# Patient Record
Sex: Female | Born: 1955 | Race: Black or African American | Hispanic: No | Smoking: Former smoker
Health system: Southern US, Community
[De-identification: ages and names within clinical notes are randomized; demographics above are authoritative.]

## PROBLEM LIST (undated history)

## (undated) DIAGNOSIS — Z8489 Family history of other specified conditions: Secondary | ICD-10-CM

## (undated) DIAGNOSIS — C73 Malignant neoplasm of thyroid gland: Secondary | ICD-10-CM

## (undated) DIAGNOSIS — K219 Gastro-esophageal reflux disease without esophagitis: Secondary | ICD-10-CM

## (undated) DIAGNOSIS — M1990 Unspecified osteoarthritis, unspecified site: Secondary | ICD-10-CM

## (undated) DIAGNOSIS — E669 Obesity, unspecified: Secondary | ICD-10-CM

## (undated) DIAGNOSIS — B369 Superficial mycosis, unspecified: Secondary | ICD-10-CM

## (undated) DIAGNOSIS — E214 Other specified disorders of parathyroid gland: Secondary | ICD-10-CM

## (undated) DIAGNOSIS — I10 Essential (primary) hypertension: Secondary | ICD-10-CM

## (undated) DIAGNOSIS — E079 Disorder of thyroid, unspecified: Secondary | ICD-10-CM

## (undated) HISTORY — DX: Malignant neoplasm of thyroid gland: C73

## (undated) HISTORY — PX: IRRIGATION AND DEBRIDEMENT SEBACEOUS CYST: SHX5255

---

## 1995-12-01 HISTORY — PX: TUBAL LIGATION: SHX77

## 2005-04-22 ENCOUNTER — Encounter: Admission: RE | Admit: 2005-04-22 | Discharge: 2005-04-22 | Payer: Self-pay | Admitting: Internal Medicine

## 2006-05-13 ENCOUNTER — Encounter: Admission: RE | Admit: 2006-05-13 | Discharge: 2006-05-13 | Payer: Self-pay | Admitting: Internal Medicine

## 2010-12-21 ENCOUNTER — Encounter: Payer: Self-pay | Admitting: Internal Medicine

## 2011-03-13 ENCOUNTER — Other Ambulatory Visit: Payer: Self-pay | Admitting: Internal Medicine

## 2011-03-13 DIAGNOSIS — Z1231 Encounter for screening mammogram for malignant neoplasm of breast: Secondary | ICD-10-CM

## 2011-03-20 ENCOUNTER — Ambulatory Visit: Payer: Self-pay

## 2011-04-03 ENCOUNTER — Ambulatory Visit
Admission: RE | Admit: 2011-04-03 | Discharge: 2011-04-03 | Disposition: A | Discharge status: Home / Self Care | Payer: BC Managed Care – PPO | Source: Ambulatory Visit | Attending: Internal Medicine | Admitting: Internal Medicine

## 2011-04-03 DIAGNOSIS — Z1231 Encounter for screening mammogram for malignant neoplasm of breast: Secondary | ICD-10-CM

## 2011-07-08 ENCOUNTER — Inpatient Hospital Stay (INDEPENDENT_AMBULATORY_CARE_PROVIDER_SITE_OTHER)
Admission: RE | Admit: 2011-07-08 | Discharge: 2011-07-08 | Disposition: A | Discharge status: Home / Self Care | Payer: BC Managed Care – PPO | Source: Ambulatory Visit | Attending: Family Medicine | Admitting: Family Medicine

## 2011-07-08 ENCOUNTER — Ambulatory Visit (INDEPENDENT_AMBULATORY_CARE_PROVIDER_SITE_OTHER): Payer: BC Managed Care – PPO

## 2011-07-08 DIAGNOSIS — S92919A Unspecified fracture of unspecified toe(s), initial encounter for closed fracture: Secondary | ICD-10-CM

## 2013-10-03 ENCOUNTER — Other Ambulatory Visit: Payer: Self-pay | Admitting: Internal Medicine

## 2013-10-03 DIAGNOSIS — Z1231 Encounter for screening mammogram for malignant neoplasm of breast: Secondary | ICD-10-CM

## 2013-10-23 ENCOUNTER — Ambulatory Visit
Admission: RE | Admit: 2013-10-23 | Discharge: 2013-10-23 | Disposition: A | Discharge status: Home / Self Care | Payer: BC Managed Care – PPO | Source: Ambulatory Visit | Attending: Internal Medicine | Admitting: Internal Medicine

## 2013-10-23 DIAGNOSIS — Z1231 Encounter for screening mammogram for malignant neoplasm of breast: Secondary | ICD-10-CM

## 2014-03-24 ENCOUNTER — Emergency Department (INDEPENDENT_AMBULATORY_CARE_PROVIDER_SITE_OTHER)
Admission: EM | Admit: 2014-03-24 | Discharge: 2014-03-24 | Disposition: A | Discharge status: Home / Self Care | Payer: BC Managed Care – PPO | Source: Home / Self Care | Attending: Family Medicine | Admitting: Family Medicine

## 2014-03-24 ENCOUNTER — Encounter (HOSPITAL_COMMUNITY): Payer: Self-pay | Admitting: Emergency Medicine

## 2014-03-24 DIAGNOSIS — H01006 Unspecified blepharitis left eye, unspecified eyelid: Secondary | ICD-10-CM

## 2014-03-24 DIAGNOSIS — H00036 Abscess of eyelid left eye, unspecified eyelid: Secondary | ICD-10-CM

## 2014-03-24 DIAGNOSIS — H01009 Unspecified blepharitis unspecified eye, unspecified eyelid: Secondary | ICD-10-CM

## 2014-03-24 DIAGNOSIS — H00039 Abscess of eyelid unspecified eye, unspecified eyelid: Secondary | ICD-10-CM

## 2014-03-24 MED ORDER — AZITHROMYCIN 250 MG PO TABS: 250.0000 mg | ORAL_TABLET | Freq: Every day | ORAL | Status: DC

## 2014-03-24 NOTE — Discharge Instructions (Signed)
Blepharitis Blepharitis is redness, soreness, and swelling (inflammation) of one or both eyelids. It may be caused by an allergic reaction or a bacterial infection. Blepharitis may also be associated with reddened, scaly skin (seborrhea) of the scalp and eyebrows. While you sleep, eye discharge may cause your eyelashes to stick together. Your eyelids may itch, burn, swell, and may lose their lashes. These will grow back. Your eyes may become sensitive. Blepharitis may recur and need repeated treatment. If this is the case, you may require further evaluation by an eye specialist (ophthalmologist). HOME CARE INSTRUCTIONS   Keep your hands clean.  Use a clean towel each time you dry your eyelids. Do not use this towel to clean other areas. Do not share a towel or makeup with anyone.  Wash your eyelids with warm water or warm water mixed with a small amount of baby shampoo. Do this twice a day or as often as needed.  Wash your face and eyebrows at least once a day.  Use warm compresses 2 times a day for 10 minutes at a time, or as directed by your caregiver.  Apply antibiotic ointment as directed by your caregiver.  Avoid rubbing your eyes.  Avoid wearing makeup until you get better.  Follow up with your caregiver as directed. SEEK IMMEDIATE MEDICAL CARE IF:   You have pain, redness, or swelling that gets worse or spreads to other parts of your face.  Your vision changes, or you have pain when looking at lights or moving objects.  You have a fever.  Your symptoms continue for longer than 2 to 4 days or become worse. MAKE SURE YOU:   Understand these instructions.  Will watch your condition.  Will get help right away if you are not doing well or get worse. Document Released: 11/13/2000 Document Revised: 02/08/2012 Document Reviewed: 12/24/2010 [facility] Patient Information 2014 [place], [place].  Periorbital Cellulitis Periorbital cellulitis is a common infection that can affect the  eyelid and the soft tissues that surround the eyeball. The infection may also affect the structures that produce and drain tears. It does not affect the eyeball itself. Natural tissue barriers usually prevent the spread of this infection to the eyeball and other deeper areas of the eye socket.  CAUSES  Bacterial infection.  Long-term (chronic) sinus infections.  An object (foreign body) stuck behind the eye.  An injury that goes through the eyelid tissues.  An injury that causes an infection, such as an insect sting.  Fracture of the bone around the eye.  Infections which have spread from the eyelid or other structures around the eye.  Bite wounds.  Inflammation or infection of the lining membranes of the brain (meningitis).  An infection in the blood (septicemia).  Dental infection (abscess).  Viral infection (this is rare). SYMPTOMS Symptoms usually come on suddenly.  Pain in the eye.  Red, hot, and swollen eyelids and possibly cheeks. The swelling is sometimes bad enough that the eyelids cannot open. Some infections make the eyelids look purple.  Fever and feeling generally ill.  Pain when touching the area around the eye. DIAGNOSIS  Periorbital cellulitis can be diagnosed from an eye exam. In severe cases, your caregiver might suggest:  Blood tests.  Imaging tests (such as a CT scan) to examine the sinuses and the area around and behind the eyeball. TREATMENT If your caregiver feels that you do not have any signs of serious infection, treatment may include:  Antibiotics.  Nasal decongestants to reduce swelling.  Referral to a dentist if it is suspected that the infection was caused by a prior tooth infection.  Examination every day to make sure the problem is improving. HOME CARE INSTRUCTIONS  Take your antibiotics as directed. Finish them even if you start to feel better.  Some pain is normal with this condition. Take pain medicine as directed by your  caregiver. Only take pain medicines approved by your caregiver.  It is important to drink fluids. Drink enough water and fluids to keep your urine clear or pale yellow.  Do not smoke.  Rest and get plenty of sleep.  Mild or moderate fevers generally have no long-term effects and often do not require treatment.  If your caregiver has given you a follow-up appointment, it is very important to keep that appointment. Your caregiver will need to make sure that the infection is getting better. It is important to check that a more serious infection is not developing. SEEK IMMEDIATE MEDICAL CARE IF:  Your eyelids become more painful, red, warm, or swollen.  You develop double vision or your vision becomes blurred or worsens in any way.  You have trouble moving your eyes.  The eye looks like it is popping out (proptosis).  You develop a severe headache, severe neck pain, or neck stiffness.  You develop repeated vomiting.  You have a fever or persistent symptoms for more than 72 hours.  You have a fever and your symptoms suddenly get worse. MAKE SURE YOU:  Understand these instructions.  Will watch your condition.  Will get help right away if you are not doing well or get worse. Document Released: 12/19/2010 Document Revised: 02/08/2012 Document Reviewed: 12/19/2010 [facility] Patient Information 2014 [place], [place].   F/U with Opthalmology if worsens.

## 2014-03-24 NOTE — ED Notes (Signed)
C/o left belepharitis  States she seen PCP and was dx with belepharitis  Drops was prescibed States eye hurts now States she has a numbness feeling in her nose to her left cheek bone States area around eye is redder

## 2014-03-24 NOTE — ED Provider Notes (Signed)
CSN: [id number]     Arrival date & time 03/24/14  1208 History   First MD Initiated Contact with Patient 03/24/14 1339     Chief Complaint  Patient presents with  . Belepharitis   (Consider location/radiation/quality/duration/timing/severity/associated sxs/prior Treatment) HPI Comments: Patient presents with left eyelid swelling. Initially diagnosed with Blepharitis by her PCP 4 days ago. She was placed on Cipro drops, but reports that now the eyelid is worse with pain and swelling into the cheek area with slightly increase pain. Her eye feels "gritty" and "dry". No eye redness. No change in vision. No fever or chills.   The history is provided by the patient.    History reviewed. No pertinent past medical history. History reviewed. No pertinent past surgical history. History reviewed. No pertinent family history. History  Substance Use Topics  . Smoking status: Not on file  . Smokeless tobacco: Not on file  . Alcohol Use: Not on file   OB History   Grav Para Term Preterm Abortions TAB SAB Ect Mult Living                 Review of Systems  All other systems reviewed and are negative.   Allergies  Review of patient's allergies indicates no known allergies.  Home Medications   Prior to Admission medications   Medication Sig Start Date End Date Taking? Authorizing Provider  amLODipine (NORVASC) 5 MG tablet Take 5 mg by mouth daily.   Yes Historical Provider, MD  triamterene-hydrochlorothiazide (MAXZIDE-25) 37.5-25 MG per tablet Take 1 tablet by mouth daily.   Yes Historical Provider, MD  azithromycin (ZITHROMAX) 250 MG tablet Take 1 tablet (250 mg total) by mouth daily. Take first 2 tablets together, then 1 every day until finished. 03/24/14   Bjorn Pippin, PA-C   BP 162/91  Pulse 76  Temp(Src) 98.5 F (36.9 C) (Oral)  Resp 18  SpO2 99% Physical Exam  Nursing note and vitals reviewed. Constitutional: She is oriented to person, place, and time. She appears  well-developed and well-nourished. No distress.  HENT:  Head: Normocephalic and atraumatic.  Mouth/Throat: Oropharynx is clear and moist.  Scalp with dryness and scaliness, left eyelid is slightly pink in color, with local swelling, mild changes of collarette are noted.   Eyes: Conjunctivae are normal. Pupils are equal, round, and reactive to light. Right eye exhibits no discharge. Left eye exhibits no discharge. No scleral icterus.  No scleral injection, fundus exam normal, pain to palpation along the inferior portion of left eye, no swelling, slight warmth  Neck: Normal range of motion. Neck supple.  Lymphadenopathy:    She has no cervical adenopathy.  Neurological: She is alert and oriented to person, place, and time. No cranial nerve deficit.  Skin: Skin is warm and dry. Rash noted. She is not diaphoretic.  Psychiatric: Her behavior is normal.    ED Course  Procedures (including critical care time) Labs Review Labs Reviewed - No data to display  Imaging Review No results found.   MDM   1. Blepharitis of left eye   2. Cellulitis of left eyelid    Education given re: warm compresses, lid massage and washing. In the setting of swelling below the eye, will cover with oral abx In case of severe blepharitis or for possible overlapping cellulitis. She is in agreement. Urgent f/u with Opthalmology if worsens.    Bjorn Pippin, PA-C 03/24/14 1424

## 2014-03-24 NOTE — ED Provider Notes (Signed)
Medical screening examination/treatment/procedure(s) were performed by resident physician or non-physician practitioner and as supervising physician I was immediately available for consultation/collaboration.   Pauline Good MD.   Billy Fischer, MD 03/24/14 [phone]

## 2014-11-30 DIAGNOSIS — C73 Malignant neoplasm of thyroid gland: Secondary | ICD-10-CM

## 2014-11-30 HISTORY — DX: Malignant neoplasm of thyroid gland: C73

## 2015-02-26 ENCOUNTER — Other Ambulatory Visit: Payer: Self-pay | Admitting: Nurse Practitioner

## 2015-03-14 ENCOUNTER — Encounter (HOSPITAL_COMMUNITY)
Admission: RE | Admit: 2015-03-14 | Discharge: 2015-03-14 | Disposition: A | Discharge status: Home / Self Care | Payer: BC Managed Care – PPO | Source: Ambulatory Visit | Attending: Nurse Practitioner | Admitting: Nurse Practitioner

## 2015-03-14 ENCOUNTER — Encounter (HOSPITAL_COMMUNITY): Payer: BC Managed Care – PPO

## 2015-03-14 MED ORDER — TECHNETIUM TC 99M SESTAMIBI - CARDIOLITE
26.0000 | Freq: Once | INTRAVENOUS | Status: AC | PRN
  Administered 2015-03-14: 26 via INTRAVENOUS

## 2015-04-09 ENCOUNTER — Other Ambulatory Visit: Payer: Self-pay | Admitting: Otolaryngology

## 2015-04-09 DIAGNOSIS — D497 Neoplasm of unspecified behavior of endocrine glands and other parts of nervous system: Secondary | ICD-10-CM

## 2015-04-12 ENCOUNTER — Ambulatory Visit
Admission: RE | Admit: 2015-04-12 | Discharge: 2015-04-12 | Disposition: A | Discharge status: Home / Self Care | Payer: BC Managed Care – PPO | Source: Ambulatory Visit | Attending: Otolaryngology | Admitting: Otolaryngology

## 2015-04-12 DIAGNOSIS — D497 Neoplasm of unspecified behavior of endocrine glands and other parts of nervous system: Secondary | ICD-10-CM

## 2015-04-17 ENCOUNTER — Other Ambulatory Visit: Payer: Self-pay | Admitting: Otolaryngology

## 2015-04-17 DIAGNOSIS — D497 Neoplasm of unspecified behavior of endocrine glands and other parts of nervous system: Secondary | ICD-10-CM

## 2015-05-01 ENCOUNTER — Ambulatory Visit
Admission: RE | Admit: 2015-05-01 | Discharge: 2015-05-01 | Disposition: A | Discharge status: Home / Self Care | Payer: BC Managed Care – PPO | Source: Ambulatory Visit | Attending: Otolaryngology | Admitting: Otolaryngology

## 2015-05-01 ENCOUNTER — Other Ambulatory Visit (HOSPITAL_COMMUNITY)
Admission: RE | Admit: 2015-05-01 | Discharge: 2015-05-01 | Disposition: A | Discharge status: Home / Self Care | Payer: BC Managed Care – PPO | Source: Ambulatory Visit | Attending: Interventional Radiology | Admitting: Interventional Radiology

## 2015-05-01 DIAGNOSIS — E041 Nontoxic single thyroid nodule: Secondary | ICD-10-CM | POA: Diagnosis not present

## 2015-05-01 DIAGNOSIS — D497 Neoplasm of unspecified behavior of endocrine glands and other parts of nervous system: Secondary | ICD-10-CM

## 2015-05-22 ENCOUNTER — Ambulatory Visit: Payer: Self-pay | Admitting: Otolaryngology

## 2015-05-22 NOTE — H&P (Signed)
Assessment  Hypercalcemia (275.42) (E83.52). 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 Orders  US Thyroid Ultrasound; Requested for: 04 Apr 2015. Discussed  Hypercalcemia with a right upper parathyroid adenoma identified on nuclear imaging. On exam, there is a palpable thyroid nodule also on the right. I want to get an ultrasound to evaluate this. This will be worked up independently and prior to parathyroid surgery in order to avoid 2 possible separate operations. She is likely going to require parathyroidectomy. More than likely it will involve just one abnormal gland although identification of the second gland on that same side will be performed. Reason For Visit  Michelle Campos is here today at the kind request of Odem, Donna for consultation and opinion. Thyroid tumor. HPI  Long history of hypercalcemia. She has been having some problems with memory and psychological issues recently. A recent sestamibi scan reveals a suspicious adenoma of the right upper parathyroid. Allergies  No Known Drug Allergies. Current Meds  Triamterene-HCTZ 37.5-25 MG Oral Capsule;; RPT Fish Oil CAPS;; RPT Vitamin D3 TABS;; RPT AmLODIPine Besylate 5 MG Oral Tablet;; RPT. Active Problems  Acid reflux   (530.81) (K21.9) Arthritis   (716.90) (M19.90) Hypertension   (401.9) (I10). PMH  History of lipoma (V13.89) (Z86.018). PSH  Oral Surgery Tooth Extraction Surgery Excision Lipoma Tubal Ligation (V25.2). Family Hx  Family history of cardiac disorder: Father (V17.49) (Z82.49) Family history of diabetes mellitus: Mother (V18.0) (Z83.3) Family history of essential hypertension: Mother,Father (V17.49) (Z82.49) Family history of multiple myeloma: Brother (V16.7) (Z80.7). Personal Hx  Daily caffeine consumption, 1 serving a day Former smoker (V15.82) (Z87.891). ROS  Systemic: Feeling tired (fatigue).  No fever, no night sweats, and no recent weight loss. Head: No  headache. Eyes: No eye symptoms. Otolaryngeal: No hearing loss, no earache, no tinnitus, and no purulent nasal discharge.  No nasal passage blockage (stuffiness), no snoring, no sneezing, no hoarseness, and no sore throat. Cardiovascular: No chest pain or discomfort  and no palpitations. Pulmonary: No dyspnea, no cough, and no wheezing. Gastrointestinal: No dysphagia  and no heartburn.  No nausea, no abdominal pain, and no melena.  No diarrhea. Genitourinary: No dysuria. Endocrine: No muscle weakness. Musculoskeletal: No calf muscle cramps, no arthralgias, and no soft tissue swelling. Neurological: No dizziness, no fainting, no tingling, and no numbness. Psychological: No anxiety  and no depression. Skin: No rash. 12 system ROS was obtained and reviewed on the Health Maintenance form dated today.  Positive responses are shown above.  If the symptom is not checked, the patient has denied it. Vital Signs   Recorded by Rogers,Lisa on 04 Apr 2015 03:41 PM BP:152/103,  Height: 5 ft 8 in, Weight: 232 lb , BMI: 35.3 kg/m2,  BMI Calculated: 35.28 ,  BSA Calculated: 2.18. Physical Exam  APPEARANCE: Well developed, heavyset lady, in no acute distress. She is very anxious and nervous.  Oriented to time, place and person. COMMUNICATION: Normal voice   HEAD & FACE:  No scars, lesions or masses of head and face.  Sinuses nontender to palpation.  Salivary glands without mass or tenderness.  Facial strength symmetric.  No facial lesion, scars, or mass. EYES: EOMI with normal primary gaze alignment. Visual acuity grossly intact.  PERRLA EXTERNAL EAR & NOSE: No scars, lesions or masses  EAC & TYMPANIC MEMBRANE:  EAC shows no obstructing lesions or debris and tympanic membranes are normal bilaterally with good movement to insufflation. GROSS HEARING: Normal  TMJ:  Nontender  INTRANASAL EXAM: No polyps   or purulence.  NASOPHARYNX: Normal, without lesions. LIPS, TEETH & GUMS: No lip lesions, normal dentition  and normal gums. ORAL CAVITY/OROPHARYNX:  Oral mucosa moist without lesion or asymmetry of the palate, tongue, tonsil or posterior pharynx. LARYNX (mirror exam):  No lesions of the epiglottis, false cord or TVC's and cords move well to phonation. HYPOPHARYNX (mirror exam): No lesions, asymmetry or pooling of secretions. NECK:  Supple without adenopathy or mass. THYROID: 1 cm firm nodule palpable in the right anterior thyroid.  NEUROLOGIC:  No gross CN deficits. No nystagmus noted.   LYMPHATIC:  No enlarged nodes palpable. Signature  Electronically signed by : Izora Gala  M.D.; 04/04/2015 4:10 PM EST.

## 2015-05-23 ENCOUNTER — Encounter (HOSPITAL_COMMUNITY)
Admission: RE | Admit: 2015-05-23 | Discharge: 2015-05-23 | Disposition: A | Discharge status: Home / Self Care | Payer: BC Managed Care – PPO | Source: Ambulatory Visit | Attending: Anesthesiology | Admitting: Anesthesiology

## 2015-05-23 ENCOUNTER — Encounter (HOSPITAL_COMMUNITY)
Admission: RE | Admit: 2015-05-23 | Discharge: 2015-05-23 | Disposition: A | Discharge status: Home / Self Care | Payer: BC Managed Care – PPO | Source: Ambulatory Visit | Attending: Otolaryngology | Admitting: Otolaryngology

## 2015-05-23 ENCOUNTER — Encounter (HOSPITAL_COMMUNITY): Payer: Self-pay

## 2015-05-23 DIAGNOSIS — Z01811 Encounter for preprocedural respiratory examination: Secondary | ICD-10-CM

## 2015-05-23 DIAGNOSIS — Z79899 Other long term (current) drug therapy: Secondary | ICD-10-CM | POA: Diagnosis not present

## 2015-05-23 DIAGNOSIS — Z87891 Personal history of nicotine dependence: Secondary | ICD-10-CM | POA: Diagnosis not present

## 2015-05-23 DIAGNOSIS — R9431 Abnormal electrocardiogram [ECG] [EKG]: Secondary | ICD-10-CM | POA: Diagnosis not present

## 2015-05-23 DIAGNOSIS — Z01812 Encounter for preprocedural laboratory examination: Secondary | ICD-10-CM | POA: Diagnosis not present

## 2015-05-23 DIAGNOSIS — E079 Disorder of thyroid, unspecified: Secondary | ICD-10-CM | POA: Insufficient documentation

## 2015-05-23 DIAGNOSIS — Z01818 Encounter for other preprocedural examination: Secondary | ICD-10-CM | POA: Diagnosis not present

## 2015-05-23 DIAGNOSIS — I10 Essential (primary) hypertension: Secondary | ICD-10-CM | POA: Insufficient documentation

## 2015-05-23 DIAGNOSIS — K219 Gastro-esophageal reflux disease without esophagitis: Secondary | ICD-10-CM | POA: Diagnosis not present

## 2015-05-23 HISTORY — DX: Obesity, unspecified: E66.9

## 2015-05-23 HISTORY — DX: Superficial mycosis, unspecified: B36.9

## 2015-05-23 HISTORY — DX: Gastro-esophageal reflux disease without esophagitis: K21.9

## 2015-05-23 HISTORY — DX: Essential (primary) hypertension: I10

## 2015-05-23 HISTORY — DX: Unspecified osteoarthritis, unspecified site: M19.90

## 2015-05-23 HISTORY — DX: Hypercalcemia: E83.52

## 2015-05-23 HISTORY — DX: Disorder of thyroid, unspecified: E07.9

## 2015-05-23 LAB — CBC
HEMATOCRIT: 38.5 % (ref 36.0–46.0)
Hemoglobin: 12.9 g/dL (ref 12.0–15.0)
MCH: 27.2 pg (ref 26.0–34.0)
MCHC: 33.5 g/dL (ref 30.0–36.0)
MCV: 81.2 fL (ref 78.0–100.0)
PLATELETS: 267 10*3/uL (ref 150–400)
RBC: 4.74 MIL/uL (ref 3.87–5.11)
RDW: 14.7 % (ref 11.5–15.5)
WBC: 5.3 10*3/uL (ref 4.0–10.5)

## 2015-05-23 LAB — BASIC METABOLIC PANEL
Anion gap: 3 — ABNORMAL LOW (ref 5–15)
BUN: 15 mg/dL (ref 6–20)
CALCIUM: 12 mg/dL — AB (ref 8.9–10.3)
CO2: 28 mmol/L (ref 22–32)
Chloride: 106 mmol/L (ref 101–111)
Creatinine, Ser: 0.77 mg/dL (ref 0.44–1.00)
GFR calc Af Amer: >60 mL/min (ref >60)
Glucose, Bld: 95 mg/dL (ref 65–99)
Potassium: 3.5 mmol/L (ref 3.5–5.1)
SODIUM: 137 mmol/L (ref 135–145)

## 2015-05-23 NOTE — Progress Notes (Signed)
STOP-Bang score 4; Results sent to [facility]

## 2015-05-23 NOTE — Progress Notes (Signed)
   05/23/15 1255  OBSTRUCTIVE SLEEP APNEA  Have you ever been diagnosed with sleep apnea through a sleep study? No  Do you snore loudly (loud enough to be heard through closed doors)?  1  Do you often feel tired, fatigued, or sleepy during the daytime? 0  Has anyone observed you stop breathing during your sleep? 0  Do you have, or are you being treated for high blood pressure? 1  BMI more than 35 kg/m2? 1  Age over 59 years old? 1  Neck circumference greater than 40 cm/16 inches? 0  Gender: 0

## 2015-05-23 NOTE — Pre-Procedure Instructions (Signed)
Michelle Campos  05/23/2015       Your procedure is scheduled on Monday, June 27.  Report to [facility] at 0800 A.M.  Call this number if you have problems the morning of surgery:  [phone]   Remember:  Do not eat food or drink liquids after midnight.Sunday night   Take these medicines the morning of surgery with A SIP OF WATER: Amlodipine (Norvasc)    Do not wear jewelry, make-up or nail polish.  Do not wear lotions, powders, or perfumes.  Do notwear deodorant.  Do not shave 48 hours prior to surgery.     Do not bring valuables to the hospital.  [facility] is not responsible for any belongings or valuables.  Contacts, dentures or bridgework may not be worn into surgery.  Leave your suitcase in the car.  After surgery it may be brought to your room.  For patients admitted to the hospital, discharge time will be determined by your treatment team.      Special instructions:  [facility] - Preparing for Surgery  Before surgery, you can play an important role.  Because skin is not sterile, your skin needs to be as free of germs as possible.  You can reduce the number of germs on you skin by washing with CHG (chlorahexidine gluconate) soap before surgery.  CHG is an antiseptic cleaner which kills germs and bonds with the skin to continue killing germs even after washing.  Please DO NOT use if you have an allergy to CHG or antibacterial soaps.  If your skin becomes reddened/irritated stop using the CHG and inform your nurse when you arrive at Short Stay.  Do not shave (including legs and underarms) for at least 48 hours prior to the first CHG shower.  You may shave your face.  Please follow these instructions carefully:   1.  Shower with CHG Soap the night before surgery and the     morning of Surgery.  2.  If you choose to wash your hair, wash your hair first as usual with your   normal shampoo.  3.  After you shampoo, rinse your hair and body  thoroughly to remove the    Shampoo.  4.  Use CHG as you would any other liquid soap.  You can apply chg directly   to the skin and wash gently with scrungie or a clean washcloth.  5.  Apply the CHG Soap to your body ONLY FROM THE NECK DOWN.   Do not use on open wounds or open sores.  Avoid contact with your eyes,   ears, mouth and genitals (private parts).  Wash genitals (private parts)   with your normal soap.  6.  Wash thoroughly, paying special attention to the area where your surgery  will be performed.  7.  Thoroughly rinse your body with warm water from the neck down.  8.  DO NOT shower/wash with your normal soap after using and rinsing off    the CHG Soap.  9.  Pat yourself dry with a clean towel.            10.  Wear clean pajamas.            11 .  Place clean sheets on your bed the night of your first shower and do not   sleep with pets.  Day of Surgery  Do not apply any lotions/deoderants the morning of surgery.  Please wear clean clothes to the hospital/surgery  center.    Please read over the following fact sheets that you were given. Pain Booklet, Coughing and Deep Breathing and Surgical Site Infection Prevention

## 2015-05-24 NOTE — Progress Notes (Signed)
Anesthesia Chart Review:  Pt is 59 year old female scheduled for R thyroid lobectomy, R parathyroidecotmy with frozen section, possible total thyroidectomy on 05/27/2015 with Dr. Constance Holster.   PMH includes: HTN, GERD, thyroid mass. Former smoker. BMI 35.5.   Medications include: amlodipine, maxzide.   Preoperative labs reviewed.  Ca 12.   Chest x-ray 05/23/2015 reviewed. No active cardiopulmonary disease.   EKG 05/23/2015: NSR. Cannot rule out Anterior infarct, age undetermined.  Reviewed EKG with Dr. Lissa Hoard.   If no changes, I anticipate pt can proceed with surgery as scheduled.   Willeen Cass, FNP-BC [facility]/Anesthesiology Phone: [phone] 05/24/2015 3:04 PM

## 2015-05-27 ENCOUNTER — Ambulatory Visit (HOSPITAL_COMMUNITY): Payer: BC Managed Care – PPO | Admitting: Certified Registered"

## 2015-05-27 ENCOUNTER — Observation Stay (HOSPITAL_COMMUNITY)
Admission: RE | Admit: 2015-05-27 | Discharge: 2015-05-28 | Disposition: A | Discharge status: Home / Self Care | Payer: BC Managed Care – PPO | Source: Ambulatory Visit | Attending: Otolaryngology | Admitting: Otolaryngology

## 2015-05-27 ENCOUNTER — Encounter (HOSPITAL_COMMUNITY)
Admission: RE | Disposition: A | Discharge status: Home / Self Care | Payer: Self-pay | Source: Ambulatory Visit | Attending: Otolaryngology

## 2015-05-27 ENCOUNTER — Encounter (HOSPITAL_COMMUNITY): Payer: Self-pay | Admitting: *Deleted

## 2015-05-27 ENCOUNTER — Ambulatory Visit (HOSPITAL_COMMUNITY): Payer: BC Managed Care – PPO | Admitting: Emergency Medicine

## 2015-05-27 DIAGNOSIS — Z87891 Personal history of nicotine dependence: Secondary | ICD-10-CM | POA: Diagnosis not present

## 2015-05-27 DIAGNOSIS — M1990 Unspecified osteoarthritis, unspecified site: Secondary | ICD-10-CM | POA: Diagnosis not present

## 2015-05-27 DIAGNOSIS — I10 Essential (primary) hypertension: Secondary | ICD-10-CM | POA: Diagnosis not present

## 2015-05-27 DIAGNOSIS — D351 Benign neoplasm of parathyroid gland: Secondary | ICD-10-CM | POA: Diagnosis present

## 2015-05-27 DIAGNOSIS — C73 Malignant neoplasm of thyroid gland: Principal | ICD-10-CM | POA: Insufficient documentation

## 2015-05-27 HISTORY — PX: THYROID LOBECTOMY: SHX420

## 2015-05-27 HISTORY — PX: THYROIDECTOMY: SHX17

## 2015-05-27 HISTORY — PX: PARATHYROIDECTOMY: SHX19

## 2015-05-27 HISTORY — DX: Other specified disorders of parathyroid gland: E21.4

## 2015-05-27 HISTORY — DX: Family history of other specified conditions: Z84.89

## 2015-05-27 SURGERY — THYROIDECTOMY
Anesthesia: General | Site: Neck | Laterality: Right

## 2015-05-27 MED ORDER — OXYCODONE HCL 5 MG PO TABS: 5.0000 mg | ORAL_TABLET | Freq: Once | ORAL | Status: DC | PRN

## 2015-05-27 MED ORDER — HYDROCODONE-ACETAMINOPHEN 5-325 MG PO TABS: 1.0000 | ORAL_TABLET | ORAL | Status: DC | PRN

## 2015-05-27 MED ORDER — FENTANYL CITRATE (PF) 250 MCG/5ML IJ SOLN
INTRAMUSCULAR | Status: AC
  Filled 2015-05-27: qty 5

## 2015-05-27 MED ORDER — LACTATED RINGERS IV SOLN
INTRAVENOUS | Status: DC
  Administered 2015-05-27: 09:00:00 via INTRAVENOUS

## 2015-05-27 MED ORDER — LIDOCAINE HCL (CARDIAC) 20 MG/ML IV SOLN
INTRAVENOUS | Status: DC | PRN
  Administered 2015-05-27: 40 mg via INTRAVENOUS

## 2015-05-27 MED ORDER — NEOSTIGMINE METHYLSULFATE 10 MG/10ML IV SOLN
INTRAVENOUS | Status: DC | PRN
  Administered 2015-05-27: 4 mg via INTRAVENOUS

## 2015-05-27 MED ORDER — OMEGA-3-ACID ETHYL ESTERS 1 G PO CAPS
1.0000 g | ORAL_CAPSULE | Freq: Every day | ORAL | Status: DC
  Administered 2015-05-27 – 2015-05-28 (×2): 1 g via ORAL
  Filled 2015-05-27 (×2): qty 1

## 2015-05-27 MED ORDER — PROMETHAZINE HCL 25 MG RE SUPP: 25.0000 mg | Freq: Four times a day (QID) | RECTAL | Status: DC | PRN

## 2015-05-27 MED ORDER — CEFAZOLIN SODIUM-DEXTROSE 2-3 GM-% IV SOLR
2.0000 g | INTRAVENOUS | Status: AC
  Administered 2015-05-27: 2 g via INTRAVENOUS
  Filled 2015-05-27: qty 50

## 2015-05-27 MED ORDER — MIDAZOLAM HCL 5 MG/5ML IJ SOLN
INTRAMUSCULAR | Status: DC | PRN
  Administered 2015-05-27: 2 mg via INTRAVENOUS

## 2015-05-27 MED ORDER — ARTIFICIAL TEARS OP OINT
TOPICAL_OINTMENT | OPHTHALMIC | Status: DC | PRN
  Administered 2015-05-27: 1 via OPHTHALMIC

## 2015-05-27 MED ORDER — DEXAMETHASONE SODIUM PHOSPHATE 4 MG/ML IJ SOLN
INTRAMUSCULAR | Status: AC
  Filled 2015-05-27: qty 2

## 2015-05-27 MED ORDER — MIDAZOLAM HCL 2 MG/2ML IJ SOLN
INTRAMUSCULAR | Status: AC
  Filled 2015-05-27: qty 2

## 2015-05-27 MED ORDER — 0.9 % SODIUM CHLORIDE (POUR BTL) OPTIME
TOPICAL | Status: DC | PRN
  Administered 2015-05-27: 1000 mL

## 2015-05-27 MED ORDER — GLYCOPYRROLATE 0.2 MG/ML IJ SOLN
INTRAMUSCULAR | Status: DC | PRN
  Administered 2015-05-27: 0.6 mg via INTRAVENOUS

## 2015-05-27 MED ORDER — HYDROMORPHONE HCL 1 MG/ML IJ SOLN
INTRAMUSCULAR | Status: AC
  Administered 2015-05-27: 15:00:00
  Filled 2015-05-27: qty 1

## 2015-05-27 MED ORDER — CLOTRIMAZOLE 1 % EX CREA
TOPICAL_CREAM | Freq: Two times a day (BID) | CUTANEOUS | Status: DC
  Filled 2015-05-27: qty 15

## 2015-05-27 MED ORDER — DEXTROSE-NACL 5-0.9 % IV SOLN
INTRAVENOUS | Status: DC
  Administered 2015-05-27: 16:00:00 via INTRAVENOUS

## 2015-05-27 MED ORDER — TRIAMTERENE-HCTZ 37.5-25 MG PO TABS
1.0000 | ORAL_TABLET | Freq: Every day | ORAL | Status: DC
  Administered 2015-05-27 – 2015-05-28 (×2): 1 via ORAL
  Filled 2015-05-27 (×2): qty 1

## 2015-05-27 MED ORDER — PROMETHAZINE HCL 25 MG PO TABS: 25.0000 mg | ORAL_TABLET | Freq: Four times a day (QID) | ORAL | Status: DC | PRN

## 2015-05-27 MED ORDER — FENTANYL CITRATE (PF) 100 MCG/2ML IJ SOLN
INTRAMUSCULAR | Status: DC | PRN
  Administered 2015-05-27 (×2): 50 ug via INTRAVENOUS
  Administered 2015-05-27: 100 ug via INTRAVENOUS

## 2015-05-27 MED ORDER — SODIUM CHLORIDE 0.9 % IV SOLN
INTRAVENOUS | Status: DC | PRN
  Administered 2015-05-27: 10:00:00 via INTRAVENOUS

## 2015-05-27 MED ORDER — HYDROCODONE-ACETAMINOPHEN 7.5-325 MG PO TABS: 1.0000 | ORAL_TABLET | Freq: Four times a day (QID) | ORAL | Status: DC | PRN

## 2015-05-27 MED ORDER — LACTATED RINGERS IV SOLN
INTRAVENOUS | Status: DC | PRN
  Administered 2015-05-27: 10:00:00 via INTRAVENOUS

## 2015-05-27 MED ORDER — PROPOFOL 10 MG/ML IV BOLUS
INTRAVENOUS | Status: AC
  Filled 2015-05-27: qty 20

## 2015-05-27 MED ORDER — ROCURONIUM BROMIDE 100 MG/10ML IV SOLN
INTRAVENOUS | Status: DC | PRN
  Administered 2015-05-27: 50 mg via INTRAVENOUS

## 2015-05-27 MED ORDER — PROPOFOL 10 MG/ML IV BOLUS
INTRAVENOUS | Status: DC | PRN
  Administered 2015-05-27: 200 mg via INTRAVENOUS

## 2015-05-27 MED ORDER — ONDANSETRON HCL 4 MG/2ML IJ SOLN
INTRAMUSCULAR | Status: DC | PRN
Start: 2015-05-27 — End: 2015-05-27
  Administered 2015-05-27: 4 mg via INTRAVENOUS

## 2015-05-27 MED ORDER — IBUPROFEN 100 MG/5ML PO SUSP
400.0000 mg | Freq: Four times a day (QID) | ORAL | Status: DC | PRN
  Administered 2015-05-27: 400 mg via ORAL
  Filled 2015-05-27: qty 20

## 2015-05-27 MED ORDER — OXYCODONE HCL 5 MG/5ML PO SOLN: 5.0000 mg | Freq: Once | ORAL | Status: DC | PRN

## 2015-05-27 MED ORDER — ONDANSETRON HCL 4 MG/2ML IJ SOLN: 4.0000 mg | Freq: Once | INTRAMUSCULAR | Status: DC | PRN

## 2015-05-27 MED ORDER — HYDROMORPHONE HCL 1 MG/ML IJ SOLN
0.2500 mg | INTRAMUSCULAR | Status: DC | PRN
  Administered 2015-05-27 (×2): 0.5 mg via INTRAVENOUS

## 2015-05-27 MED ORDER — AMLODIPINE BESYLATE 5 MG PO TABS
7.5000 mg | ORAL_TABLET | Freq: Every day | ORAL | Status: DC
  Administered 2015-05-28: 7.5 mg via ORAL
  Filled 2015-05-27 (×2): qty 1

## 2015-05-27 MED ORDER — DEXAMETHASONE SODIUM PHOSPHATE 4 MG/ML IJ SOLN
INTRAMUSCULAR | Status: DC | PRN
  Administered 2015-05-27: 8 mg via INTRAVENOUS

## 2015-05-27 MED ORDER — FAMOTIDINE 10 MG PO TABS: 10.0000 mg | ORAL_TABLET | ORAL | Status: DC | PRN

## 2015-05-27 MED ORDER — VITAMIN D 1000 UNITS PO TABS
4000.0000 [IU] | ORAL_TABLET | Freq: Every day | ORAL | Status: DC
  Administered 2015-05-27 – 2015-05-28 (×2): 4000 [IU] via ORAL
  Filled 2015-05-27 (×3): qty 4

## 2015-05-27 SURGICAL SUPPLY — 42 items
ADH SKN CLS APL DERMABOND .7 (GAUZE/BANDAGES/DRESSINGS) ×1
BLADE SURG 15 STRL LF DISP TIS (BLADE) IMPLANT
BLADE SURG 15 STRL SS (BLADE)
CANISTER SUCTION 2500CC (MISCELLANEOUS) ×2 IMPLANT
CLEANER TIP ELECTROSURG 2X2 (MISCELLANEOUS) ×2 IMPLANT
CONT SPEC 4OZ CLIKSEAL STRL BL (MISCELLANEOUS) ×4 IMPLANT
CORDS BIPOLAR (ELECTRODE) ×2 IMPLANT
COVER SURGICAL LIGHT HANDLE (MISCELLANEOUS) ×2 IMPLANT
DERMABOND ADVANCED (GAUZE/BANDAGES/DRESSINGS) ×1
DERMABOND ADVANCED .7 DNX12 (GAUZE/BANDAGES/DRESSINGS) ×1 IMPLANT
DRAIN HEMOVAC 7FR (DRAIN) ×1 IMPLANT
DRAIN SNY 10 ROU (WOUND CARE) IMPLANT
DRAPE PROXIMA HALF (DRAPES) ×1 IMPLANT
ELECT COATED BLADE 2.86 ST (ELECTRODE) ×2 IMPLANT
ELECT REM PT RETURN 9FT ADLT (ELECTROSURGICAL) ×2
ELECTRODE REM PT RTRN 9FT ADLT (ELECTROSURGICAL) ×1 IMPLANT
EVACUATOR SILICONE 100CC (DRAIN) ×2 IMPLANT
FORCEPS BIPOLAR SPETZLER 8 1.0 (NEUROSURGERY SUPPLIES) ×2 IMPLANT
GAUZE SPONGE 4X4 16PLY XRAY LF (GAUZE/BANDAGES/DRESSINGS) IMPLANT
GLOVE BIO SURGEON STRL SZ 6.5 (GLOVE) ×1 IMPLANT
GLOVE BIOGEL PI IND STRL 6.5 (GLOVE) IMPLANT
GLOVE BIOGEL PI INDICATOR 6.5 (GLOVE) ×1
GLOVE ECLIPSE 7.5 STRL STRAW (GLOVE) ×2 IMPLANT
GLOVE SURG SS PI 6.5 STRL IVOR (GLOVE) ×1 IMPLANT
GLOVE SURG SS PI 7.0 STRL IVOR (GLOVE) ×1 IMPLANT
GOWN STRL REUS W/ TWL LRG LVL3 (GOWN DISPOSABLE) ×2 IMPLANT
GOWN STRL REUS W/TWL LRG LVL3 (GOWN DISPOSABLE) ×6
KIT BASIN OR (CUSTOM PROCEDURE TRAY) ×2 IMPLANT
KIT ROOM TURNOVER OR (KITS) ×2 IMPLANT
NEEDLE 27GAX1X1/2 (NEEDLE) ×1 IMPLANT
NS IRRIG 1000ML POUR BTL (IV SOLUTION) ×2 IMPLANT
PAD ARMBOARD 7.5X6 YLW CONV (MISCELLANEOUS) ×4 IMPLANT
PENCIL FOOT CONTROL (ELECTRODE) ×2 IMPLANT
SHEARS HARMONIC 9CM CVD (BLADE) ×2 IMPLANT
SOL PREP POV-IOD 4OZ 10% (MISCELLANEOUS) ×1 IMPLANT
STAPLER VISISTAT 35W (STAPLE) ×2 IMPLANT
SUT CHROMIC 4 0 PS 2 18 (SUTURE) ×4 IMPLANT
SUT ETHILON 3 0 PS 1 (SUTURE) ×2 IMPLANT
SUT SILK 3 0 REEL (SUTURE) IMPLANT
SUT SILK 4 0 REEL (SUTURE) ×2 IMPLANT
TOWEL OR 17X24 6PK STRL BLUE (TOWEL DISPOSABLE) ×2 IMPLANT
TRAY ENT MC OR (CUSTOM PROCEDURE TRAY) ×2 IMPLANT

## 2015-05-27 NOTE — Interval H&P Note (Signed)
History and Physical Interval Note:  05/27/2015 9:31 AM  Michelle Campos  has presented today for surgery, with the diagnosis of PARATHYROID ADENOMA, NEOPLASM OF THYROID  The various methods of treatment have been discussed with the patient and family. After consideration of risks, benefits and other options for treatment, the patient has consented to  Procedure(s): RIGHT THYROID LOBECTOMY, RIGHT PARATHYROIDECTOMY WITH FROZEN SECTION, POSSIBLE TOTAL THYROIDECTOMY (Right) as a surgical intervention .  The patient's history has been reviewed, patient examined, no change in status, stable for surgery.  I have reviewed the patient's chart and labs.  Questions were answered to the patient's satisfaction.     Kobe Ofallon

## 2015-05-27 NOTE — Discharge Instructions (Signed)
You may shower and use soap and water but do not use any cream's, oils or ointment on the incision. °

## 2015-05-27 NOTE — Anesthesia Postprocedure Evaluation (Signed)
  Anesthesia Post-op Note  Patient: Michelle Campos  Procedure(s) Performed: Procedure(s): RIGHT THYROID LOBECTOMY WITH FROZEN SECTION (Right) RIGHT PARATHYROIDECTOMY WITH FROZEN SECTION (Right)  Patient Location: PACU  Anesthesia Type:General  Level of Consciousness: awake, alert  and oriented  Airway and Oxygen Therapy: Patient Spontanous Breathing and Patient connected to nasal cannula oxygen  Post-op Pain: mild  Post-op Assessment: Post-op Vital signs reviewed, Patient's Cardiovascular Status Stable, Respiratory Function Stable, Patent Airway and Pain level controlled              Post-op Vital Signs: stable  Last Vitals:  Filed Vitals:   05/27/15 1305  BP:   Pulse:   Temp: 36.5 C  Resp:     Complications: No apparent anesthesia complications

## 2015-05-27 NOTE — Transfer of Care (Signed)
Immediate Anesthesia Transfer of Care Note  Patient: Michelle Campos  Procedure(s) Performed: Procedure(s): RIGHT THYROID LOBECTOMY WITH FROZEN SECTION (Right) RIGHT PARATHYROIDECTOMY WITH FROZEN SECTION (Right)  Patient Location: PACU  Anesthesia Type:General  Level of Consciousness: awake, alert  and oriented  Airway & Oxygen Therapy: Patient Spontanous Breathing and Patient connected to nasal cannula oxygen  Post-op Assessment: Report given to RN, Post -op Vital signs reviewed and stable and Patient moving all extremities X 4  Post vital signs: Reviewed and stable  Last Vitals:  Filed Vitals:   05/27/15 1125  BP: 124/71  Pulse: 82  Temp:   Resp: 22    Complications: No apparent anesthesia complications

## 2015-05-27 NOTE — Op Note (Signed)
OPERATIVE REPORT  DATE OF SURGERY: 05/27/2015  PATIENT:  Michelle Campos,  59 y.o. female  PRE-OPERATIVE DIAGNOSIS:  Hypercalcemia, parathyroid adenoma, thyroid mass  POST-OPERATIVE DIAGNOSIS:  Hypercalcemia, parathyroid adenoma, thyroid mass  PROCEDURE:  Procedure(s): RIGHT THYROID LOBECTOMY WITH FROZEN SECTION RIGHT PARATHYROIDECTOMY WITH FROZEN SECTION  SURGEON:  Beckie Salts, MD  ASSISTANTS: Jolene Provost PA  ANESTHESIA:   General   EBL:  30 ml  DRAINS: 7 [place] JP  LOCAL MEDICATIONS USED:  None  SPECIMEN:  Right thyroid lobe, frozen section analysis to follicular lesions. Right parathyroid adenoma, right normal parathyroid.  COUNTS:  Correct  PROCEDURE DETAILS: The patient was taken to the operating room and placed on the operating table in the supine position. A shoulder roll was placed beneath the shoulder blades and the neck was extended. The neck was prepped and draped in a standard fashion. A low collar transverse incision was outlined marking pen and was incised with electrocautery. Dissection was continued down through the platysma layer. A [place] or retractor was used throughout the case.  The midline fascia was divided. The strap muscles were reflected off the right lobe of the thyroid laterally. The thyroid lobe was reflected medially. Section was then accomplished along the capsule of the right thyroid lobe. The lobe was brought forward. The superior vasculature was identified and divided using the harmonic dissector. Middle thyroid vein in a similar fashion. Inferior vasculature also treated the same way. A larger caliber vessels were cauterized with bipolar cautery as well. The right lobe was dissected off of the trachea divided at the isthmus. This was sent for frozen section analysis. There were several small firm nodules palpable. Attention was then paid towards the parathyroid adenoma. A large, ovoid, 3 cm soft mass was identified in the deeper tissue  along the tracheoesophageal groove. A suspected recurrent nerve was identified lateral to this adenoma. The adenoma was dissected off of the surrounding tissue. Sent for pathologic evaluation. An additional normal-appearing parathyroid was identified just inferior to that. A small sample was incised with a 15 scalpel and sent for frozen section analysis. No dissection was accomplished on the left side. The wound was irrigated with saline. The drain was placed into the wound and exited through a separate stab incision inferior to the main incision. This was secured with nylon suture. The midline fascia was reapproximated with 4-0 chromic suture. The platysma layer was reapproximated with chromic as well. A 4-0 chromic subcuticular closure was then accomplished. Dermabond was used on the skin. The drain was charged. The patient was awakened extubated and transferred to recovery in stable condition.   PATIENT DISPOSITION:  To PACU, stable

## 2015-05-27 NOTE — Anesthesia Procedure Notes (Signed)
Procedure Name: Intubation Date/Time: 05/27/2015 9:55 AM Performed by: Gaylene Brooks Pre-anesthesia Checklist: Patient identified, Timeout performed, Emergency Drugs available, Suction available and Patient being monitored Patient Re-evaluated:Patient Re-evaluated prior to inductionOxygen Delivery Method: Circle system utilized Preoxygenation: Pre-oxygenation with 100% oxygen Intubation Type: IV induction Ventilation: Mask ventilation without difficulty and Oral airway inserted - appropriate to patient size Laryngoscope Size: Sabra Heck and 2 Grade View: Grade I Tube type: Oral Tube size: 7.0 mm Number of attempts: 1 Airway Equipment and Method: Stylet Placement Confirmation: ETT inserted through vocal cords under direct vision,  breath sounds checked- equal and bilateral,  positive ETCO2 and CO2 detector Secured at: 21 cm Tube secured with: Tape Dental Injury: Teeth and Oropharynx as per pre-operative assessment

## 2015-05-27 NOTE — Anesthesia Preprocedure Evaluation (Signed)
Anesthesia Evaluation  Patient identified by MRN, date of birth, ID band Patient awake    Reviewed: Allergy & Precautions, NPO status , Unable to perform ROS - Chart review only  Airway Mallampati: II  TM Distance: >3 FB Neck ROM: Full    Dental  (+) Teeth Intact, Dental Advisory Given   Pulmonary former smoker,  breath sounds clear to auscultation        Cardiovascular hypertension, Rhythm:Regular Rate:Normal     Neuro/Psych    GI/Hepatic   Endo/Other    Renal/GU      Musculoskeletal   Abdominal   Peds  Hematology   Anesthesia Other Findings   Reproductive/Obstetrics                             Anesthesia Physical Anesthesia Plan  ASA: III  Anesthesia Plan: General   Post-op Pain Management:    Induction: Intravenous  Airway Management Planned: Oral ETT  Additional Equipment:   Intra-op Plan:   Post-operative Plan: Extubation in OR  Informed Consent: I have reviewed the patients History and Physical, chart, labs and discussed the procedure including the risks, benefits and alternatives for the proposed anesthesia with the patient or authorized representative who has indicated his/her understanding and acceptance.   Dental advisory given  Plan Discussed with: CRNA and Anesthesiologist  Anesthesia Plan Comments: (Htn Parathyroid adenoma with hypercalcemia Ca 12.0  Roberts Gaudy)        Anesthesia Quick Evaluation

## 2015-05-27 NOTE — Progress Notes (Signed)
Patient admitted to [facility] [place] room 32. Awake and oriented. Surgical incision unremarkable. Adhesive intact. JP drain to bulb suction. No c/o's pain. Oriented to room. Call bell within reach.

## 2015-05-27 NOTE — H&P (View-Only) (Signed)
Assessment  Hypercalcemia (275.42) (E83.52). 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 Orders  US Thyroid Ultrasound; Requested for: 04 Apr 2015. Discussed  Hypercalcemia with a right upper parathyroid adenoma identified on nuclear imaging. On exam, there is a palpable thyroid nodule also on the right. I want to get an ultrasound to evaluate this. This will be worked up independently and prior to parathyroid surgery in order to avoid 2 possible separate operations. She is likely going to require parathyroidectomy. More than likely it will involve just one abnormal gland although identification of the second gland on that same side will be performed. Reason For Visit  Barb Shear is here today at the kind request of Volney Presser for consultation and opinion. Thyroid tumor. HPI  Long history of hypercalcemia. She has been having some problems with memory and psychological issues recently. A recent sestamibi scan reveals a suspicious adenoma of the right upper parathyroid. Allergies  No Known Drug Allergies. Current Meds  Triamterene-HCTZ 37.5-25 MG Oral Capsule;; RPT Fish Oil CAPS;; RPT Vitamin D3 TABS;; RPT AmLODIPine Besylate 5 MG Oral Tablet;; RPT. Active Problems  Acid reflux   (530.81) (K21.9) Arthritis   (716.90) (M19.90) Hypertension   (401.9) (I10). PMH  History of lipoma (V13.89) (Z86.018). PSH  Oral Surgery Tooth Extraction Surgery Excision Lipoma Tubal Ligation (V25.2). Family Hx  Family history of cardiac disorder: Father (V75.49) (Z69.49) Family history of diabetes mellitus: Mother (V18.0) (Z83.3) Family history of essential hypertension: Mother,Father (V17.49) (Z82.49) Family history of multiple myeloma: Brother (V16.7) (Z80.7). Personal Hx  Daily caffeine consumption, 1 serving a day Former smoker [phone]) [phone]). ROS  Systemic: Feeling tired (fatigue).  No fever, no night sweats, and no recent weight loss. Head: No  headache. Eyes: No eye symptoms. Otolaryngeal: No hearing loss, no earache, no tinnitus, and no purulent nasal discharge.  No nasal passage blockage (stuffiness), no snoring, no sneezing, no hoarseness, and no sore throat. Cardiovascular: No chest pain or discomfort  and no palpitations. Pulmonary: No dyspnea, no cough, and no wheezing. Gastrointestinal: No dysphagia  and no heartburn.  No nausea, no abdominal pain, and no melena.  No diarrhea. Genitourinary: No dysuria. Endocrine: No muscle weakness. Musculoskeletal: No calf muscle cramps, no arthralgias, and no soft tissue swelling. Neurological: No dizziness, no fainting, no tingling, and no numbness. Psychological: No anxiety  and no depression. Skin: No rash. 12 system ROS was obtained and reviewed on the Health Maintenance form dated today.  Positive responses are shown above.  If the symptom is not checked, the patient has denied it. Vital Signs   Recorded by Rogers,Lisa on 04 Apr 2015 03:41 PM BP:152/103,  Height: 5 ft 8 in, Weight: 232 lb , BMI: 35.3 kg/m2,  BMI Calculated: 35.28 ,  BSA Calculated: 2.18. Physical Exam  APPEARANCE: Well developed, heavyset lady, in no acute distress. She is very anxious and nervous.  Oriented to time, place and person. COMMUNICATION: Normal voice   HEAD & FACE:  No scars, lesions or masses of head and face.  Sinuses nontender to palpation.  Salivary glands without mass or tenderness.  Facial strength symmetric.  No facial lesion, scars, or mass. EYES: EOMI with normal primary gaze alignment. Visual acuity grossly intact.  PERRLA EXTERNAL EAR & NOSE: No scars, lesions or masses  EAC & TYMPANIC MEMBRANE:  EAC shows no obstructing lesions or debris and tympanic membranes are normal bilaterally with good movement to insufflation. GROSS HEARING: Normal  TMJ:  Nontender  INTRANASAL EXAM: No polyps  or purulence.  NASOPHARYNX: Normal, without lesions. LIPS, TEETH & GUMS: No lip lesions, normal dentition  and normal gums. ORAL CAVITY/OROPHARYNX:  Oral mucosa moist without lesion or asymmetry of the palate, tongue, tonsil or posterior pharynx. LARYNX (mirror exam):  No lesions of the epiglottis, false cord or TVC's and cords move well to phonation. HYPOPHARYNX (mirror exam): No lesions, asymmetry or pooling of secretions. NECK:  Supple without adenopathy or mass. THYROID: 1 cm firm nodule palpable in the right anterior thyroid.  NEUROLOGIC:  No gross CN deficits. No nystagmus noted.   LYMPHATIC:  No enlarged nodes palpable. Signature  Electronically signed by : Izora Gala  M.D.; 04/04/2015 4:10 PM EST.

## 2015-05-28 ENCOUNTER — Encounter (HOSPITAL_COMMUNITY): Payer: Self-pay | Admitting: Otolaryngology

## 2015-05-28 DIAGNOSIS — C73 Malignant neoplasm of thyroid gland: Secondary | ICD-10-CM | POA: Diagnosis not present

## 2015-05-28 NOTE — Discharge Summary (Signed)
  Physician Discharge Summary  Patient ID: Michelle Campos MRN: [id number] DOB/AGE: 08/03/1956 59 y.o.  Admit date: 05/27/2015 Discharge date: 05/28/2015  Admission Diagnoses: Hypercalcemia, thyroid mass  Discharge Diagnoses:  Active Problems:   Hypercalcemia   Discharged Condition: good  Hospital Course: No complications  Consults: none  Significant Diagnostic Studies: none  Treatments: surgery: Parathyroidectomy, thyroid lobectomy  Discharge Exam: Blood pressure 120/67, pulse 73, temperature 98.2 F (36.8 C), temperature source Oral, resp. rate 20, height 5\' 9"  (1.753 m), weight 106.595 kg (235 lb), SpO2 98 %. PHYSICAL EXAM: Incision excellent. Voice normal. JP removed.  Disposition: 01-Home or Self Care     Medication List    TAKE these medications        amLODipine 5 MG tablet  Commonly known as:  NORVASC  Take 7.5 mg by mouth daily. Take one & one half tablet by mouth daily     clotrimazole-betamethasone cream  Commonly known as:  LOTRISONE  Apply 1 application topically daily as needed. For rash on chest per patient     famotidine 10 MG tablet  Commonly known as:  PEPCID  Take 10 mg by mouth as needed for heartburn or indigestion.     FISH OIL PO  Take 2 tablets by mouth daily.     HYDROcodone-acetaminophen 7.5-325 MG per tablet  Commonly known as:  NORCO  Take 1 tablet by mouth every 6 (six) hours as needed for moderate pain.     promethazine 25 MG suppository  Commonly known as:  PHENERGAN  Place 1 suppository (25 mg total) rectally every 6 (six) hours as needed for nausea or vomiting.     triamterene-hydrochlorothiazide 37.5-25 MG per tablet  Commonly known as:  MAXZIDE-25  Take 1 tablet by mouth daily.     Vitamin D (Cholecalciferol) 1000 UNITS Tabs  Take 4 tablets by mouth daily.           Follow-up Information    Follow up with Izora Gala, MD. Schedule an appointment as soon as possible for a visit in 2 days.   Specialty:   Otolaryngology   Contact information:   [address] [place] [address] [phone]       Signed: Izora Gala 05/28/2015, 9:25 AM

## 2015-05-28 NOTE — Progress Notes (Signed)
Patient discharged home with instructions. 

## 2018-01-14 LAB — HM PAP SMEAR: HM Pap smear: NORMAL

## 2018-03-11 LAB — HM MAMMOGRAPHY: HM MAMMO: NORMAL (ref 0–4)

## 2018-10-06 ENCOUNTER — Encounter: Payer: Self-pay | Admitting: Nurse Practitioner

## 2018-10-06 ENCOUNTER — Ambulatory Visit (INDEPENDENT_AMBULATORY_CARE_PROVIDER_SITE_OTHER): Admitting: Nurse Practitioner

## 2018-10-06 VITALS — BP 124/82 | HR 94 | Temp 98.2°F | Ht 67.0 in | Wt 255.4 lb

## 2018-10-06 DIAGNOSIS — Z6841 Body Mass Index (BMI) 40.0 and over, adult: Secondary | ICD-10-CM

## 2018-10-06 DIAGNOSIS — E214 Other specified disorders of parathyroid gland: Secondary | ICD-10-CM | POA: Diagnosis not present

## 2018-10-06 DIAGNOSIS — R600 Localized edema: Secondary | ICD-10-CM | POA: Diagnosis not present

## 2018-10-06 DIAGNOSIS — M1990 Unspecified osteoarthritis, unspecified site: Secondary | ICD-10-CM | POA: Diagnosis not present

## 2018-10-06 DIAGNOSIS — I10 Essential (primary) hypertension: Secondary | ICD-10-CM | POA: Diagnosis not present

## 2018-10-06 DIAGNOSIS — E559 Vitamin D deficiency, unspecified: Secondary | ICD-10-CM

## 2018-10-06 MED ORDER — AMLODIPINE BESYLATE 10 MG PO TABS: 10.0000 mg | ORAL_TABLET | Freq: Every day | ORAL | 1 refills | Status: DC

## 2018-10-06 MED ORDER — TRIAMTERENE-HCTZ 37.5-25 MG PO TABS: 1.0000 | ORAL_TABLET | Freq: Every day | ORAL | 1 refills | Status: DC

## 2018-10-06 MED ORDER — TRIAMTERENE-HCTZ 37.5-25 MG PO TABS: 1.0000 | ORAL_TABLET | Freq: Every day | ORAL | 0 refills | Status: DC

## 2018-10-06 NOTE — Progress Notes (Signed)
Careteam: Patient Care Team: System, Pcp Not In as PCP - General  Advanced Directive information Does Patient Have a Medical Advance Directive?: No  Allergies  Allergen Reactions  . Skin Adhesives [Cyanoacrylate] Itching    Chief Complaint  Patient presents with  . Medical Management of Chronic Issues    Pt is being seen to establish care. Pt recently relocated to [facility] from [place].      HPI: Patient is a 62 y.o. female seen in the office today to establish care. Moved to [place] for 3 years and just moved back. 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   Had PAP/oncology exam earlier this year.   Hypertension- on amlodipine 10 mg daily and triamterene-hctz daily for htn, blood pressure stable.   Previously with Vit D def- taking maintenance dose   Anxiety/stress- taking st john wort to help with calming.   Unsure why she is taking Vit E  OA- in right knee mostly, sometimes left. Will take ibuprofen as needed  Just got back into the gym. 3 days a week 1 hour.   thyroid cancer- s/p parathyroidectomy and thyroid lobectomy.    Review of Systems:  Review of Systems  Constitutional: Negative for chills, fever and weight loss.  HENT: Negative for tinnitus.   Respiratory: Negative for cough, sputum production and shortness of breath.   Cardiovascular: Positive for leg swelling (worse now that she is working at home and sitting more. ). Negative for chest pain and palpitations.  Gastrointestinal: Negative for abdominal pain, constipation, diarrhea and heartburn.  Genitourinary: Negative for dysuria, frequency and urgency.  Musculoskeletal: Negative for back pain, falls, joint pain and myalgias.  Skin: Negative.   Neurological: Negative for dizziness and headaches.  Psychiatric/Behavioral: Negative for depression and memory loss. The patient does not have insomnia.     Past Medical History:    Diagnosis Date  . Arthritis    "right knee" (05/27/2015)  . Family history of adverse reaction to anesthesia    "it's hard to bring my father back; more than once too" (05/27/2015)  . GERD (gastroesophageal reflux disease)   . Hypercalcemia    parathyroid gland removed  . Hypertension    controlled with medication  . Obesity (BMI 30-39.9)   . 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 Superficial fungus infection of skin    on chest  . Thyroid cancer ([place])   . Thyroid mass    "2"   Past Surgical History:  Procedure Laterality Date  . CESAREAN SECTION  1997  . IRRIGATION AND DEBRIDEMENT SEBACEOUS CYST  ~ 2010   on back  . PARATHYROIDECTOMY Right 05/27/2015  . PARATHYROIDECTOMY Right 05/27/2015   Procedure: RIGHT PARATHYROIDECTOMY WITH FROZEN SECTION;  Surgeon: Izora Gala, MD;  Location: [facility];  Service: ENT;  Laterality: Right;  . THYROID LOBECTOMY Right 05/27/2015  . THYROIDECTOMY Right 05/27/2015   Procedure: RIGHT THYROID LOBECTOMY WITH FROZEN SECTION;  Surgeon: Izora Gala, MD;  Location: [facility];  Service: ENT;  Laterality: Right;  . TUBAL LIGATION  1997   Social History:   reports that she has quit smoking. Her smoking use included cigarettes. She has a 1.00 pack-year smoking history. She has never used smokeless tobacco. She reports that she drinks alcohol. She reports that she does not use drugs.  Family History  Problem Relation Age of Onset  . Congestive Heart Failure Mother   .  Diabetes Mother 90  . Congestive Heart Failure Father   . Multiple myeloma Brother 66  . Diabetes Sister 56  . Hypertension Sister   . Hypertension Brother   . Hypertension Brother   . Hyperlipidemia Brother   . Hypertension Brother   . Hyperlipidemia Brother     Medications: Patient's Medications  New Prescriptions   No medications on file  Previous Medications   CHOLECALCIFEROL (VITAMIN D3 PO)    Take 50 mcg by mouth daily.   ST JOHNS WORT 300 MG CAPS    Take 1 capsule by mouth daily.   VITAMIN E  400 UNIT CAPSULE    Take 400 Units by mouth daily.  Modified Medications   Modified Medication Previous Medication   AMLODIPINE (NORVASC) 10 MG TABLET amLODipine (NORVASC) 10 MG tablet      Take 1 tablet (10 mg total) by mouth daily.    Take 10 mg by mouth daily.   TRIAMTERENE-HYDROCHLOROTHIAZIDE (MAXZIDE-25) 37.5-25 MG TABLET triamterene-hydrochlorothiazide (MAXZIDE-25) 37.5-25 MG per tablet      Take 1 tablet by mouth daily.    Take 1 tablet by mouth daily.  Discontinued Medications   No medications on file     Physical Exam:  Vitals:   10/06/18 0848  BP: 124/82  Pulse: 94  Temp: 98.2 F (36.8 C)  TempSrc: Oral  SpO2: 98%  Weight: 255 lb 6.4 oz (115.8 kg)  Height: '5\' 7"'$  (1.702 m)   Body mass index is 40 kg/m.  Physical Exam  Constitutional: She is oriented to person, place, and time. She appears well-developed and well-nourished. No distress.  HENT:  Head: Normocephalic and atraumatic.  Mouth/Throat: Oropharynx is clear and moist. No oropharyngeal exudate.  Eyes: Pupils are equal, round, and reactive to light. Conjunctivae are normal.  Neck: Normal range of motion. Neck supple.  Cardiovascular: Normal rate, regular rhythm and normal heart sounds.  Pulmonary/Chest: Effort normal and breath sounds normal.  Abdominal: Soft. Bowel sounds are normal.  Musculoskeletal: She exhibits no edema or tenderness.  Neurological: She is alert and oriented to person, place, and time.  Skin: Skin is warm and dry. She is not diaphoretic.  Psychiatric: She has a normal mood and affect.    Labs reviewed: Basic Metabolic Panel: No results for input(s): NA, K, CL, CO2, GLUCOSE, BUN, CREATININE, CALCIUM, MG, PHOS, TSH in the last 8760 hours. Liver Function Tests: No results for input(s): AST, ALT, ALKPHOS, BILITOT, PROT, ALBUMIN in the last 8760 hours. No results for input(s): LIPASE, AMYLASE in the last 8760 hours. No results for input(s): AMMONIA in the last 8760 hours. CBC: No  results for input(s): WBC, NEUTROABS, HGB, HCT, MCV, PLT in the last 8760 hours. Lipid Panel: No results for input(s): CHOL, HDL, LDLCALC, TRIG, CHOLHDL, LDLDIRECT in the last 8760 hours. TSH: No results for input(s): TSH in the last 8760 hours. A1C: No results found for: HGBA1C   Assessment/Plan 1. Essential hypertension -stable on current regimen. Will continue current medications.  - Lipid Panel; Future - COMPLETE METABOLIC PANEL WITH GFR; Future - CBC with Differential/Platelets; Future  2. Arthritis Stable, occasional knee pain - CBC with Differential/Platelets; Future  3. 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 Lower leg edema Worse since she has been sitting more for work. Encouraged to increase activity during the day. Compression hose during the day.  Decrease sodium intake.  Elevate legs above the heart as tolerated.   5. Vitamin D deficiency On  supplement daily  - Vitamin D, 25-hydroxy; Future  6. Obesity, morbid (more than 100 lbs over ideal weight or BMI > 40) (HCC) Noted today, discussed with pt weight and BMI, encouraged to continue to increase activity as tolerated and dietary modifications enocouarged.   7. Body mass index (BMI) of 40.1-44.9 in adult [facility]) Noted today  Next appt: 01/03/2019 Janett Billow K. [place], [facility] [phone]

## 2018-10-06 NOTE — Patient Instructions (Addendum)
Follow up in 3 months for extended visit with FASTING blood work before visit  To decrease sodium intake Increase activity throughout day To wear compression socks/hose throughout the day, remove at night Elevate legs above the level of the heart if able      Fat and Cholesterol Restricted Diet Getting too much fat and cholesterol in your diet may cause health problems. Following this diet helps keep your fat and cholesterol at normal levels. This can keep you from getting sick. What types of fat should I choose?  Choose monosaturated and polyunsaturated fats. These are found in foods such as olive oil, canola oil, flaxseeds, walnuts, almonds, and seeds.  Eat more omega-3 fats. Good choices include salmon, mackerel, sardines, tuna, flaxseed oil, and ground flaxseeds.  Limit saturated fats. These are in animal products such as meats, butter, and cream. They can also be in plant products such as palm oil, palm kernel oil, and coconut oil.  Avoid foods with partially hydrogenated oils in them. These contain trans fats. Examples of foods that have trans fats are stick margarine, some tub margarines, cookies, crackers, and other baked goods. What general guidelines do I need to follow?  Check food labels. Look for the words "trans fat" and "saturated fat."  When preparing a meal: ? Fill half of your plate with vegetables and green salads. ? Fill one fourth of your plate with whole grains. Look for the word "whole" as the first word in the ingredient list. ? Fill one fourth of your plate with lean protein foods.  Eat more foods that have fiber, like apples, carrots, beans, peas, and barley.  Eat more home-cooked foods. Eat less at restaurants and buffets.  Limit or avoid alcohol.  Limit foods high in starch and sugar.  Limit fried foods.  Cook foods without frying them. Baking, boiling, grilling, and broiling are all great options.  Lose weight if you are overweight. Losing even a  small amount of weight can help your overall health. It can also help prevent diseases such as diabetes and heart disease. What foods can I eat? Grains Whole grains, such as whole wheat or whole grain breads, crackers, cereals, and pasta. Unsweetened oatmeal, bulgur, barley, quinoa, or brown rice. Corn or whole wheat flour tortillas. Vegetables Fresh or frozen vegetables (raw, steamed, roasted, or grilled). Green salads. Fruits All fresh, canned (in natural juice), or frozen fruits. Meat and Other Protein Products Ground beef (85% or leaner), grass-fed beef, or beef trimmed of fat. Skinless chicken or [place]. Ground chicken or [place]. Pork trimmed of fat. All fish and seafood. Eggs. Dried beans, peas, or lentils. Unsalted nuts or seeds. Unsalted canned or dry beans. Dairy Low-fat dairy products, such as skim or 1% milk, 2% or reduced-fat cheeses, low-fat ricotta or cottage cheese, or plain low-fat yogurt. Fats and Oils Tub margarines without trans fats. Light or reduced-fat mayonnaise and salad dressings. Avocado. Olive, canola, sesame, or safflower oils. Natural peanut or almond butter (choose ones without added sugar and oil). The items listed above may not be a complete list of recommended foods or beverages. Contact your dietitian for more options. What foods are not recommended? Grains White bread. White pasta. White rice. Cornbread. Bagels, pastries, and croissants. Crackers that contain trans fat. Vegetables White potatoes. Corn. Creamed or fried vegetables. Vegetables in a cheese sauce. Fruits Dried fruits. Canned fruit in light or heavy syrup. Fruit juice. Meat and Other Protein Products Fatty cuts of meat. Ribs, chicken wings, bacon, sausage, bologna, salami,  chitterlings, fatback, hot dogs, bratwurst, and packaged luncheon meats. Liver and organ meats. Dairy Whole or 2% milk, cream, half-and-half, and cream cheese. Whole milk cheeses. Whole-fat or sweetened yogurt. Full-fat  cheeses. Nondairy creamers and whipped toppings. Processed cheese, cheese spreads, or cheese curds. Sweets and Desserts Corn syrup, sugars, honey, and molasses. Candy. Jam and jelly. Syrup. Sweetened cereals. Cookies, pies, cakes, donuts, muffins, and ice cream. Fats and Oils Butter, stick margarine, lard, shortening, ghee, or bacon fat. Coconut, palm kernel, or palm oils. Beverages Alcohol. Sweetened drinks (such as sodas, lemonade, and fruit drinks or punches). The items listed above may not be a complete list of foods and beverages to avoid. Contact your dietitian for more information. This information is not intended to replace advice given to you by your health care provider. Make sure you discuss any questions you have with your health care provider. Document Released: 05/17/2012 Document Revised: 07/23/2016 Document Reviewed: 02/15/2014 Elsevier Interactive Patient Education  [employer].

## 2018-10-11 NOTE — Addendum Note (Signed)
Addended by: Lauree Chandler on: 10/11/2018 02:24 PM   Modules accepted: Level of Service

## 2019-01-03 ENCOUNTER — Other Ambulatory Visit

## 2019-01-03 DIAGNOSIS — I10 Essential (primary) hypertension: Secondary | ICD-10-CM

## 2019-01-03 DIAGNOSIS — E559 Vitamin D deficiency, unspecified: Secondary | ICD-10-CM

## 2019-01-03 DIAGNOSIS — M1990 Unspecified osteoarthritis, unspecified site: Secondary | ICD-10-CM

## 2019-01-03 DIAGNOSIS — E214 Other specified disorders of parathyroid gland: Secondary | ICD-10-CM

## 2019-01-04 LAB — CBC WITH DIFFERENTIAL/PLATELET
Absolute Monocytes: 383 cells/uL (ref 200–950)
Basophils Absolute: 32 cells/uL (ref 0–200)
Basophils Relative: 0.7 %
EOS PCT: 5.2 %
Eosinophils Absolute: 234 cells/uL (ref 15–500)
HCT: 40.5 % (ref 35.0–45.0)
Hemoglobin: 13.3 g/dL (ref 11.7–15.5)
Lymphs Abs: 1395 cells/uL (ref 850–3900)
MCH: 27 pg (ref 27.0–33.0)
MCHC: 32.8 g/dL (ref 32.0–36.0)
MCV: 82.2 fL (ref 80.0–100.0)
MPV: 12.4 fL (ref 7.5–12.5)
Monocytes Relative: 8.5 %
Neutro Abs: 2457 cells/uL (ref 1500–7800)
Neutrophils Relative %: 54.6 %
Platelets: 294 10*3/uL (ref 140–400)
RBC: 4.93 10*6/uL (ref 3.80–5.10)
RDW: 14.5 % (ref 11.0–15.0)
TOTAL LYMPHOCYTE: 31 %
WBC: 4.5 10*3/uL (ref 3.8–10.8)

## 2019-01-04 LAB — COMPLETE METABOLIC PANEL WITH GFR
AG Ratio: 1.5 (calc) (ref 1.0–2.5)
ALT: 22 U/L (ref 6–29)
AST: 21 U/L (ref 10–35)
Albumin: 4.3 g/dL (ref 3.6–5.1)
Alkaline phosphatase (APISO): 97 U/L (ref 37–153)
BUN: 10 mg/dL (ref 7–25)
CALCIUM: 9.6 mg/dL (ref 8.6–10.4)
CO2: 30 mmol/L (ref 20–32)
Chloride: 104 mmol/L (ref 98–110)
Creat: 0.7 mg/dL (ref 0.50–0.99)
GFR, EST NON AFRICAN AMERICAN: 93 mL/min/{1.73_m2} (ref >=60)
GFR, Est African American: 108 mL/min/{1.73_m2} (ref >=60)
GLUCOSE: 105 mg/dL — AB (ref 65–99)
Globulin: 2.9 g/dL (calc) (ref 1.9–3.7)
Potassium: 3.7 mmol/L (ref 3.5–5.3)
Sodium: 141 mmol/L (ref 135–146)
Total Bilirubin: 0.6 mg/dL (ref 0.2–1.2)
Total Protein: 7.2 g/dL (ref 6.1–8.1)

## 2019-01-04 LAB — LIPID PANEL
CHOLESTEROL: 170 mg/dL (ref <200)
HDL: 43 mg/dL — ABNORMAL LOW (ref >=50)
LDL Cholesterol (Calc): 110 mg/dL (calc) — ABNORMAL HIGH
NON-HDL CHOLESTEROL (CALC): 127 mg/dL (ref <130)
Total CHOL/HDL Ratio: 4 (calc) (ref <5.0)
Triglycerides: 81 mg/dL (ref <150)

## 2019-01-04 LAB — VITAMIN D 25 HYDROXY (VIT D DEFICIENCY, FRACTURES): Vit D, 25-Hydroxy: 43 ng/mL (ref 30–100)

## 2019-01-04 LAB — TSH: TSH: 2.2 mIU/L (ref 0.40–4.50)

## 2019-01-10 ENCOUNTER — Encounter: Payer: Self-pay | Admitting: Nurse Practitioner

## 2019-01-10 ENCOUNTER — Encounter: Admitting: Nurse Practitioner

## 2019-01-10 ENCOUNTER — Ambulatory Visit (INDEPENDENT_AMBULATORY_CARE_PROVIDER_SITE_OTHER): Admitting: Nurse Practitioner

## 2019-01-10 VITALS — BP 140/90 | HR 86 | Temp 98.2°F | Ht 67.0 in | Wt 257.6 lb

## 2019-01-10 DIAGNOSIS — I10 Essential (primary) hypertension: Secondary | ICD-10-CM

## 2019-01-10 DIAGNOSIS — Z6841 Body Mass Index (BMI) 40.0 and over, adult: Secondary | ICD-10-CM

## 2019-01-10 DIAGNOSIS — M25561 Pain in right knee: Secondary | ICD-10-CM

## 2019-01-10 DIAGNOSIS — E214 Other specified disorders of parathyroid gland: Secondary | ICD-10-CM | POA: Diagnosis not present

## 2019-01-10 DIAGNOSIS — E782 Mixed hyperlipidemia: Secondary | ICD-10-CM

## 2019-01-10 DIAGNOSIS — R739 Hyperglycemia, unspecified: Secondary | ICD-10-CM

## 2019-01-10 DIAGNOSIS — E559 Vitamin D deficiency, unspecified: Secondary | ICD-10-CM

## 2019-01-10 DIAGNOSIS — Z0000 Encounter for general adult medical examination without abnormal findings: Secondary | ICD-10-CM

## 2019-01-10 DIAGNOSIS — Z1231 Encounter for screening mammogram for malignant neoplasm of breast: Secondary | ICD-10-CM

## 2019-01-10 DIAGNOSIS — M1990 Unspecified osteoarthritis, unspecified site: Secondary | ICD-10-CM

## 2019-01-10 NOTE — Progress Notes (Signed)
Provider: Lauree Chandler, NP  Patient Care Team: Lauree Chandler, NP as PCP - General (Geriatric Medicine)  Extended Emergency Contact Information Primary Emergency Contact: [facility] Address: [place], [place] [address] Johnnette Litter of [place] Phone: [phone] Mobile Phone: [phone] Relation: Sister Allergies  Allergen Reactions  . Skin Adhesives [Cyanoacrylate] Itching   Code Status: FULL Goals of Care: Advanced Directive information Advanced Directives 10/06/2018  Does Patient Have a Medical Advance Directive? No  Would patient like information on creating a medical advance directive? -     Chief Complaint  Patient presents with  . Annual Exam    Yearly check up, EKG and discuss labs (copy printed)   . Best Practice Recommendations    Discuss needed for mammogram, TDaP, and Hep C screening and colonoscopy. Per patient pap up to date   . Medication Management    Discuss what to do when B/P medication dose missed     HPI: Patient is a 63 y.o. female seen in today for an annual wellness exam.    Dietary regimen? none  Exercise? Bowling weekly, gym 4 days a week for 1 hour.    Dentition: yearly.   Ophthalmology appt: routinely  Routine specialist: GYN in [place], endocrinology due to partial lobectomy   Had normal colonoscopy in the past, overdue.  Gets mammogram yearly, had one last year  Depression screen PHQ 2/9 10/06/2018  Decreased Interest 0  Down, Depressed, Hopeless 0  PHQ - 2 Score 0    Fall Risk  01/10/2019 10/06/2018  Falls in the past year? 0 0  Number falls in past yr: 0 -  Injury with Fall? 0 -   No flowsheet data found.   Health Maintenance  Topic Date Due  . TETANUS/TDAP  01/16/1975  . PAP SMEAR-Modifier  01/16/1977  . COLONOSCOPY  01/16/2006  . MAMMOGRAM  10/24/2015  . Hepatitis C Screening  10/07/2019 (Originally 1956/04/05)  . HIV Screening  10/07/2019 (Originally 01/16/1971)  . INFLUENZA  VACCINE  Completed    Past Medical History:  Diagnosis Date  . Arthritis    "right knee" (05/27/2015)  . Family history of adverse reaction to anesthesia    "it's hard to bring my father back; more than once too" (05/27/2015)  . GERD (gastroesophageal reflux disease)   . Hypercalcemia    parathyroid gland removed  . Hypertension    controlled with medication  . Obesity (BMI 30-39.9)   . 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 Superficial fungus infection of skin    on chest  . Thyroid cancer ([place]) 2016  . Thyroid mass    "2"    Past Surgical History:  Procedure Laterality Date  . CESAREAN SECTION  1997  . IRRIGATION AND DEBRIDEMENT SEBACEOUS CYST  ~ 2010   on back  . PARATHYROIDECTOMY Right 05/27/2015  . PARATHYROIDECTOMY Right 05/27/2015   Procedure: RIGHT PARATHYROIDECTOMY WITH FROZEN SECTION;  Surgeon: Izora Gala, MD;  Location: [facility];  Service: ENT;  Laterality: Right;  . THYROID LOBECTOMY Right 05/27/2015  . THYROIDECTOMY Right 05/27/2015   Procedure: RIGHT THYROID LOBECTOMY WITH FROZEN SECTION;  Surgeon: Izora Gala, MD;  Location: [facility];  Service: ENT;  Laterality: Right;  . TUBAL LIGATION  1997    Social History   Socioeconomic History  . Marital status: Divorced    Spouse name: Not on file  . Number of children: Not on file  . Years of  education: Not on file  . Highest education level: Not on file  Occupational History  . Not on file  Social Needs  . Financial resource strain: Not on file  . Food insecurity:    Worry: Not on file    Inability: Not on file  . Transportation needs:    Medical: Not on file    Non-medical: Not on file  Tobacco Use  . Smoking status: Former Smoker    Packs/day: 0.25    Years: 4.00    Pack years: 1.00    Types: Cigarettes  . Smokeless tobacco: Never Used  . Tobacco comment: quit smoking cigarettes in the 1970's  Substance and Sexual Activity  . Alcohol use: Yes    Comment: maybe once or twice a year  . Drug use: No  . Sexual  activity: Yes  Lifestyle  . Physical activity:    Days per week: Not on file    Minutes per session: Not on file  . Stress: Not on file  Relationships  . Social connections:    Talks on phone: Not on file    Gets together: Not on file    Attends religious service: Not on file    Active member of club or organization: Not on file    Attends meetings of clubs or organizations: Not on file    Relationship status: Not on file  Other Topics Concern  . Not on file  Social History Narrative   Social History      Diet?       Do you drink/eat things with caffeine? Yes, soft drinks, coffee (occasionally)      Marital status?           divorced                         What year were you married? 1992      Do you live in a house, apartment, assisted living, condo, trailer, etc.? house      Is it one or more stories? 2 stories      How many persons live in your home? 2      Do you have any pets in your home? (please list) no      Highest level of education completed? Some graduate studies      Current or past profession: Web designer      Do you exercise?        yes                              Type & how often? 3 x week- bike, treadmill      Advanced Directives      Do you have a living will? no      Do you have a DNR form?    no                              If not, do you want to discuss one? no      Do you have signed POA/HPOA for forms? no      Functional Status      Do you have difficulty bathing or dressing yourself? no      Do you have difficulty preparing food or eating? no      Do you have difficulty managing your medications? no  Do you have difficulty managing your finances?  no      Do you have difficulty affording your medications?    Family History  Problem Relation Age of Onset  . Congestive Heart Failure Mother   . Diabetes Mother 9  . Hypertension Mother   . Congestive Heart Failure Father   . Multiple myeloma Brother 56  .  Diabetes Sister 59  . Hypertension Sister   . Hypertension Brother   . Hypertension Brother   . Hyperlipidemia Brother   . Hypertension Brother   . Hyperlipidemia Brother     Review of Systems:  Review of Systems  Constitutional: Negative for chills, fever and weight loss.  HENT: Negative for tinnitus.   Respiratory: Negative for cough, sputum production and shortness of breath.   Cardiovascular: Negative for chest pain, palpitations and leg swelling.  Gastrointestinal: Negative for abdominal pain, constipation, diarrhea and heartburn.  Genitourinary: Negative for dysuria, frequency and urgency.  Musculoskeletal: Negative for back pain, falls, joint pain and myalgias.  Skin: Negative.   Neurological: Negative for dizziness and headaches.  Psychiatric/Behavioral: Negative for depression and memory loss. The patient does not have insomnia.      Allergies as of 01/10/2019      Reactions   Skin Adhesives [cyanoacrylate] Itching      Medication List       Accurate as of January 10, 2019 11:20 AM. Always use your most recent med list.        amLODipine 10 MG tablet Commonly known as:  NORVASC Take 1 tablet (10 mg total) by mouth daily.   MULTIVITAMIN ADULTS 50+ PO Take by mouth daily.   St Johns Wort 300 MG Caps Take 1 capsule by mouth daily.   triamterene-hydrochlorothiazide 37.5-25 MG tablet Commonly known as:  MAXZIDE-25 Take 1 tablet by mouth daily.         Physical Exam: Vitals:   01/10/19 1056  BP: 140/90  Pulse: 86  Temp: 98.2 F (36.8 C)  TempSrc: Oral  SpO2: 98%  Weight: 257 lb 9.6 oz (116.8 kg)  Height: '5\' 7"'$  (1.702 m)   Body mass index is 40.35 kg/m. Physical Exam Constitutional:      General: She is not in acute distress.    Appearance: She is well-developed. She is not diaphoretic.  HENT:     Head: Normocephalic and atraumatic.     Mouth/Throat:     Pharynx: No oropharyngeal exudate.  Eyes:     Conjunctiva/sclera: Conjunctivae  normal.     Pupils: Pupils are equal, round, and reactive to light.  Neck:     Musculoskeletal: Normal range of motion and neck supple.  Cardiovascular:     Rate and Rhythm: Normal rate and regular rhythm.     Heart sounds: Normal heart sounds.  Pulmonary:     Effort: Pulmonary effort is normal.     Breath sounds: Normal breath sounds.  Chest:     Breasts:        Right: Normal.        Left: Normal.  Abdominal:     General: Bowel sounds are normal.     Palpations: Abdomen is soft.  Genitourinary:    Comments: By GYN Musculoskeletal:        General: No tenderness.  Skin:    General: Skin is warm and dry.  Neurological:     Mental Status: She is alert and oriented to person, place, and time.     Labs reviewed: Basic Metabolic Panel: Recent  Labs    01/03/19 0814  NA 141  K 3.7  CL 104  CO2 30  GLUCOSE 105*  BUN 10  CREATININE 0.70  CALCIUM 9.6  TSH 2.20   Liver Function Tests: Recent Labs    01/03/19 0814  AST 21  ALT 22  BILITOT 0.6  PROT 7.2   No results for input(s): LIPASE, AMYLASE in the last 8760 hours. No results for input(s): AMMONIA in the last 8760 hours. CBC: Recent Labs    01/03/19 0814  WBC 4.5  NEUTROABS 2,457  HGB 13.3  HCT 40.5  MCV 82.2  PLT 294   Lipid Panel: Recent Labs    01/03/19 0814  CHOL 170  HDL 43*  LDLCALC 110*  TRIG 81  CHOLHDL 4.0   No results found for: HGBA1C  Procedures: No results found.  Assessment/Plan 1. 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 Obesity, morbid (more than 100 lbs over ideal weight or BMI > 40) (HCC) -plan for weight loss with dietary modifications and to increase exercise   4. Body mass index (BMI) of 40.1-44.9 in adult [facility]) Noted, see number 3.  5. Vitamin D  deficiency Vit D level 47, stable  6. Arthritis Has worsen since she began exercise. Discussed proper body mechanics, may need guided exercise to prevent worsening pain - DG Knee Complete 4 Views Right; Future  7. Acute pain of right knee -acute on chronic pain of right knee - DG Knee Complete 4 Views Right; Future  8. Encounter for screening mammogram for breast cancer - MM DIGITAL SCREENING BILATERAL; Future  9. Hyperglycemia -diet modifications encouraged - Hemoglobin A1c; Future  10. Mixed hyperlipidemia Stable, to continue diet modifications.  - Lipid Panel; Future - COMPLETE METABOLIC PANEL WITH GFR; Future  11. Wellness visit The patient was counseled regarding the appropriate use of alcohol, regular self-examination of the breasts on a monthly basis, prevention of dental and periodontal disease, diet, regular sustained exercise for at least 30 minutes 5 times per week, routine screening interval for mammogram as recommended by the [place] and ACOG, importance of regular PAP smears,  and recommended schedule for GI hemoccult testing, colonoscopy, cholesterol, thyroid and diabetes screening.   Next appt: 6 months labs prior Rilan Eiland K. [place], [facility] [phone]

## 2019-01-10 NOTE — Patient Instructions (Signed)
DASH Eating Plan DASH stands for "Dietary Approaches to Stop Hypertension." The DASH eating plan is a healthy eating plan that has been shown to reduce high blood pressure (hypertension). It may also reduce your risk for type 2 diabetes, heart disease, and stroke. The DASH eating plan may also help with weight loss. What are tips for following this plan?  General guidelines  Avoid eating more than 2,300 mg (milligrams) of salt (sodium) a day. If you have hypertension, you may need to reduce your sodium intake to 1,500 mg a day.  Limit alcohol intake to no more than 1 drink a day for nonpregnant women and 2 drinks a day for men. One drink equals 12 oz of beer, 5 oz of wine, or 1 oz of hard liquor.  Work with your health care provider to maintain a healthy body weight or to lose weight. Ask what an ideal weight is for you.  Get at least 30 minutes of exercise that causes your heart to beat faster (aerobic exercise) most days of the week. Activities may include walking, swimming, or biking.  Work with your health care provider or diet and nutrition specialist (dietitian) to adjust your eating plan to your individual calorie needs. Reading food labels   Check food labels for the amount of sodium per serving. Choose foods with less than 5 percent of the Daily Value of sodium. Generally, foods with less than 300 mg of sodium per serving fit into this eating plan.  To find whole grains, look for the word "whole" as the first word in the ingredient list. Shopping  Buy products labeled as "low-sodium" or "no salt added."  Buy fresh foods. Avoid canned foods and premade or frozen meals. Cooking  Avoid adding salt when cooking. Use salt-free seasonings or herbs instead of table salt or sea salt. Check with your health care provider or pharmacist before using salt substitutes.  Do not fry foods. Cook foods using healthy methods such as baking, boiling, grilling, and broiling instead.  Cook with  heart-healthy oils, such as olive, canola, soybean, or sunflower oil. Meal planning  Eat a balanced diet that includes: ? 5 or more servings of fruits and vegetables each day. At each meal, try to fill half of your plate with fruits and vegetables. ? Up to 6-8 servings of whole grains each day. ? Less than 6 oz of lean meat, poultry, or fish each day. A 3-oz serving of meat is about the same size as a deck of cards. One egg equals 1 oz. ? 2 servings of low-fat dairy each day. ? A serving of nuts, seeds, or beans 5 times each week. ? Heart-healthy fats. Healthy fats called Omega-3 fatty acids are found in foods such as flaxseeds and coldwater fish, like sardines, salmon, and mackerel.  Limit how much you eat of the following: ? Canned or prepackaged foods. ? Food that is high in trans fat, such as fried foods. ? Food that is high in saturated fat, such as fatty meat. ? Sweets, desserts, sugary drinks, and other foods with added sugar. ? Full-fat dairy products.  Do not salt foods before eating.  Try to eat at least 2 vegetarian meals each week.  Eat more home-cooked food and less restaurant, buffet, and fast food.  When eating at a restaurant, ask that your food be prepared with less salt or no salt, if possible. What foods are recommended? The items listed may not be a complete list. Talk with your dietitian about   what dietary choices are best for you. Grains Whole-grain or whole-wheat bread. Whole-grain or whole-wheat pasta. Brown rice. Oatmeal. Quinoa. Bulgur. Whole-grain and low-sodium cereals. Pita bread. Low-fat, low-sodium crackers. Whole-wheat flour tortillas. Vegetables Fresh or frozen vegetables (raw, steamed, roasted, or grilled). Low-sodium or reduced-sodium tomato and vegetable juice. Low-sodium or reduced-sodium tomato sauce and tomato paste. Low-sodium or reduced-sodium canned vegetables. Fruits All fresh, dried, or frozen fruit. Canned fruit in natural juice (without  added sugar). Meat and other protein foods Skinless chicken or turkey. Ground chicken or turkey. Pork with fat trimmed off. Fish and seafood. Egg whites. Dried beans, peas, or lentils. Unsalted nuts, nut butters, and seeds. Unsalted canned beans. Lean cuts of beef with fat trimmed off. Low-sodium, lean deli meat. Dairy Low-fat (1%) or fat-free (skim) milk. Fat-free, low-fat, or reduced-fat cheeses. Nonfat, low-sodium ricotta or cottage cheese. Low-fat or nonfat yogurt. Low-fat, low-sodium cheese. Fats and oils Soft margarine without trans fats. Vegetable oil. Low-fat, reduced-fat, or light mayonnaise and salad dressings (reduced-sodium). Canola, safflower, olive, soybean, and sunflower oils. Avocado. Seasoning and other foods Herbs. Spices. Seasoning mixes without salt. Unsalted popcorn and pretzels. Fat-free sweets. What foods are not recommended? The items listed may not be a complete list. Talk with your dietitian about what dietary choices are best for you. Grains Baked goods made with fat, such as croissants, muffins, or some breads. Dry pasta or rice meal packs. Vegetables Creamed or fried vegetables. Vegetables in a cheese sauce. Regular canned vegetables (not low-sodium or reduced-sodium). Regular canned tomato sauce and paste (not low-sodium or reduced-sodium). Regular tomato and vegetable juice (not low-sodium or reduced-sodium). Pickles. Olives. Fruits Canned fruit in a light or heavy syrup. Fried fruit. Fruit in cream or butter sauce. Meat and other protein foods Fatty cuts of meat. Ribs. Fried meat. Bacon. Sausage. Bologna and other processed lunch meats. Salami. Fatback. Hotdogs. Bratwurst. Salted nuts and seeds. Canned beans with added salt. Canned or smoked fish. Whole eggs or egg yolks. Chicken or turkey with skin. Dairy Whole or 2% milk, cream, and half-and-half. Whole or full-fat cream cheese. Whole-fat or sweetened yogurt. Full-fat cheese. Nondairy creamers. Whipped toppings.  Processed cheese and cheese spreads. Fats and oils Butter. Stick margarine. Lard. Shortening. Ghee. Bacon fat. Tropical oils, such as coconut, palm kernel, or palm oil. Seasoning and other foods Salted popcorn and pretzels. Onion salt, garlic salt, seasoned salt, table salt, and sea salt. Worcestershire sauce. Tartar sauce. Barbecue sauce. Teriyaki sauce. Soy sauce, including reduced-sodium. Steak sauce. Canned and packaged gravies. Fish sauce. Oyster sauce. Cocktail sauce. Horseradish that you find on the shelf. Ketchup. Mustard. Meat flavorings and tenderizers. Bouillon cubes. Hot sauce and Tabasco sauce. Premade or packaged marinades. Premade or packaged taco seasonings. Relishes. Regular salad dressings. Where to find more information:  National Heart, Lung, and Blood Institute: www.nhlbi.nih.gov  American Heart Association: www.heart.org Summary  The DASH eating plan is a healthy eating plan that has been shown to reduce high blood pressure (hypertension). It may also reduce your risk for type 2 diabetes, heart disease, and stroke.  With the DASH eating plan, you should limit salt (sodium) intake to 2,300 mg a day. If you have hypertension, you may need to reduce your sodium intake to 1,500 mg a day.  When on the DASH eating plan, aim to eat more fresh fruits and vegetables, whole grains, lean proteins, low-fat dairy, and heart-healthy fats.  Work with your health care provider or diet and nutrition specialist (dietitian) to adjust your eating plan to your   individual calorie needs. This information is not intended to replace advice given to you by your health care provider. Make sure you discuss any questions you have with your health care provider. Document Released: 11/05/2011 Document Revised: 11/09/2016 Document Reviewed: 11/09/2016 Elsevier Interactive Patient Education  2019 [place] These Tests  Blood Pressure- Have your blood pressure checked by your healthcare  provider at least once a year.  Normal blood pressure is 120/80. Goal for most <140/90.  Weight- Have your body mass index (BMI) calculated to screen for obesity.  BMI is a measure of body fat based on height and weight.  You can calculate your own BMI at GravelBags.it  Cholesterol- Have your cholesterol checked every year.  Diabetes- Have your blood sugar checked every year if you have high blood pressure, high cholesterol, a family history of diabetes or if you are overweight.  Pap Test - Have a pap test every 1 to 5 years if you have been sexually active.  If you are older than 65 and recent pap tests have been normal you may not need additional pap tests.  In addition, if you have had a hysterectomy  for benign disease additional pap tests are not necessary.  Mammogram-Yearly mammograms are essential for early detection of breast cancer  Screening for Colon Cancer- Colonoscopy starting at age 22. Screening may begin sooner depending on your family history and other health conditions.  Follow up colonoscopy as directed by your Gastroenterologist.  Screening for Osteoporosis- Screening begins at age 42 with bone density scanning, sooner if you are at higher risk for developing Osteoporosis.   Get these medicines  Calcium with Vitamin D- Your body requires 1200-1500 mg of Calcium a day and [id number] IU of Vitamin D a day.  You can only absorb 500 mg of Calcium at a time therefore Calcium must be taken in 2 or 3 separate doses throughout the day.  Hormones- Hormone therapy has been associated with increased risk for certain cancers and heart disease.  Talk to your healthcare provider about if you need relief from menopausal symptoms.  Aspirin- Ask your healthcare provider about taking Aspirin to prevent Heart Disease and Stroke.   Get these Immuniztions  Flu shot- Every fall  Pneumonia shot- Once after the age of 39; if you are younger ask your healthcare provider if you need a  pneumonia shot.  Tetanus- Every ten years.  Shingrix- Once after the age of 17 to prevent shingles.   Take these steps  Don't smoke- Your healthcare provider can help you quit. For tips on how to quit, ask your healthcare provider or go to www.smokefree.gov or call 1-800 QUIT-NOW.  Be physically active- Exercise 5 days a week for a minimum of 30 minutes.  If you are not already physically active, start slow and gradually work up to 30 minutes of moderate physical activity.  Try walking, dancing, bike riding, swimming, etc.  Eat a healthy diet- Eat a variety of healthy foods such as fruits, vegetables, whole grains, low fat milk, low fat cheeses, yogurt, lean meats, chicken, fish, eggs, dried beans, tofu, etc.  For more information go to www.thenutritionsource.org  Dental visit- Brush and floss teeth twice daily; visit your dentist twice a year.  Eye exam- Visit your Optometrist or Ophthalmologist yearly.  Drink alcohol in moderation- Limit alcohol intake to one drink or less a day.  Never drink and drive.  Depression- Your emotional health is as important as your physical health.  If  you're feeling down or losing interest in things you normally enjoy, please talk to your healthcare provider.  Seat Belts- can save your life; always wear one  Smoke/Carbon Monoxide detectors- These detectors need to be installed on the appropriate level of your home.  Replace batteries at least once a year.  Violence- If anyone is threatening or hurting you, please tell your healthcare provider.  Living Will/ Health care power of attorney- Discuss with your healthcare provider and family.

## 2019-01-12 ENCOUNTER — Ambulatory Visit
Admission: RE | Admit: 2019-01-12 | Discharge: 2019-01-12 | Disposition: A | Discharge status: Home / Self Care | Payer: BC Managed Care – PPO | Source: Ambulatory Visit | Attending: Nurse Practitioner | Admitting: Nurse Practitioner

## 2019-01-12 DIAGNOSIS — M25561 Pain in right knee: Secondary | ICD-10-CM

## 2019-01-12 DIAGNOSIS — M1990 Unspecified osteoarthritis, unspecified site: Secondary | ICD-10-CM

## 2019-02-13 ENCOUNTER — Other Ambulatory Visit: Payer: Self-pay

## 2019-02-13 ENCOUNTER — Ambulatory Visit (INDEPENDENT_AMBULATORY_CARE_PROVIDER_SITE_OTHER): Admitting: Internal Medicine

## 2019-02-13 ENCOUNTER — Encounter: Payer: Self-pay | Admitting: Internal Medicine

## 2019-02-13 VITALS — BP 140/74 | HR 113 | Temp 97.8°F | Ht 67.0 in | Wt 258.6 lb

## 2019-02-13 DIAGNOSIS — C73 Malignant neoplasm of thyroid gland: Secondary | ICD-10-CM | POA: Diagnosis not present

## 2019-02-13 DIAGNOSIS — Z9009 Acquired absence of other part of head and neck: Secondary | ICD-10-CM

## 2019-02-13 DIAGNOSIS — E892 Postprocedural hypoparathyroidism: Secondary | ICD-10-CM

## 2019-02-13 LAB — T4, FREE: Free T4: 1.06 ng/dL (ref 0.60–1.60)

## 2019-02-13 LAB — TSH: TSH: 1.96 u[IU]/mL (ref 0.35–4.50)

## 2019-02-13 NOTE — Patient Instructions (Signed)
-   We will set you up for thyroid ultrasound at [facility].  - We will contact you with the results of your lab results.

## 2019-02-13 NOTE — Progress Notes (Signed)
Name: Michelle Campos  MRN/ DOB: [id number], 1955-12-21    Age/ Sex: 63 y.o., female    PCP: Lauree Chandler, NP   Reason for Endocrinology Evaluation: Papillary thyroid carcinoma     Date of Initial Endocrinology Evaluation: 02/13/2019     HPI: Ms. Michelle Campos is a 63 y.o. female with a past medical history of HTN, history of papillary thyroid carcinoma ( S/P right lobectomy in 2016), history of hypercalcemia (s/p right parathyroidectomy in 2016), and dyslipidemia . The patient presented for initial endocrinology clinic visit on 02/13/2019 for consultative assistance with her parathyroid cyst.  She is s/p right parathyroidectomy and right thyroid lobectomy on 05/27/2015  Pathology report showed a 0.6 cm right thyroid papillary carcinoma, surgical margins were negative.  Recently moved back from [place] in 07/2018.  She has never received radioactive iodine ablation.  Initial ultrasound also showed 2 left thyroid nodules, both have been biopsied with benign cytology in 2016.  Patient also states these nodules have been biopsied in [place] in 2017 with benign cytology.   She denies any local neck symptoms.   She denies prior exposure to radiation.  She has never been on L T4 replacement  No family history of thyroid carcinoma or hypercalcemia.    HISTORY:  Past Medical History:  Past Medical History:  Diagnosis Date  . Arthritis    "right knee" (05/27/2015)  . Family history of adverse reaction to anesthesia    "it's hard to bring my father back; more than once too" (05/27/2015)  . GERD (gastroesophageal reflux disease)   . Hypercalcemia    parathyroid gland removed  . Hypertension    controlled with medication  . Obesity (BMI 30-39.9)   . 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 Superficial fungus infection of skin    on chest  . Thyroid cancer ([place]) 2016  . Thyroid mass    "2"    Past Surgical History:  Past Surgical History:  Procedure Laterality Date  .  CESAREAN SECTION  1997  . IRRIGATION AND DEBRIDEMENT SEBACEOUS CYST  ~ 2010   on back  . PARATHYROIDECTOMY Right 05/27/2015  . PARATHYROIDECTOMY Right 05/27/2015   Procedure: RIGHT PARATHYROIDECTOMY WITH FROZEN SECTION;  Surgeon: Izora Gala, MD;  Location: [facility];  Service: ENT;  Laterality: Right;  . THYROID LOBECTOMY Right 05/27/2015  . THYROIDECTOMY Right 05/27/2015   Procedure: RIGHT THYROID LOBECTOMY WITH FROZEN SECTION;  Surgeon: Izora Gala, MD;  Location: [facility];  Service: ENT;  Laterality: Right;  . TUBAL LIGATION  1997      Social History:  reports that she has quit smoking. Her smoking use included cigarettes. She has a 1.00 pack-year smoking history. She has never used smokeless tobacco. She reports current alcohol use. She reports that she does not use drugs.  Family History: family history includes Congestive Heart Failure in her father and mother; Diabetes (age of onset: 84) in her sister; Diabetes (age of onset: 85) in her mother; Hyperlipidemia in her brother and brother; Hypertension in her brother, brother, brother, mother, and sister; Multiple myeloma (age of onset: 11) in her brother.   HOME MEDICATIONS: Allergies as of 02/13/2019      Reactions   Skin Adhesives [cyanoacrylate] Itching      Medication List       Accurate as of February 13, 2019  7:31 AM. Always use your most recent med list.        amLODipine 10 MG tablet Commonly known as:  NORVASC Take  1 tablet (10 mg total) by mouth daily.   MULTIVITAMIN ADULTS 50+ PO Take by mouth daily.   St Johns Wort 300 MG Caps Take 1 capsule by mouth daily.   triamterene-hydrochlorothiazide 37.5-25 MG tablet Commonly known as:  MAXZIDE-25 Take 1 tablet by mouth daily.         REVIEW OF SYSTEMS: A comprehensive ROS was conducted with the patient and is negative except as per HPI and below:  Review of Systems  Constitutional: Negative for malaise/fatigue and weight loss.  HENT: Negative for congestion and sore  throat.   Eyes: Negative for blurred vision and pain.  Respiratory: Negative for cough and shortness of breath.   Cardiovascular: Negative for chest pain.  Gastrointestinal: Negative for diarrhea and nausea.  Genitourinary: Positive for frequency.  Musculoskeletal: Positive for joint pain.  Neurological: Negative for tingling and tremors.  Endo/Heme/Allergies: Negative for polydipsia.  Psychiatric/Behavioral: Negative for depression. The patient is not nervous/anxious.        OBJECTIVE:  VS: Ht '5\' 7"'$  (1.702 m)   BMI 40.35 kg/m    Wt Readings from Last 3 Encounters:  01/10/19 257 lb 9.6 oz (116.8 kg)  10/06/18 255 lb 6.4 oz (115.8 kg)  05/27/15 235 lb (106.6 kg)     EXAM: General: Pt appears well and is in NAD  Hydration: Well-hydrated with moist mucous membranes and good skin turgor  Eyes: External eye exam normal without stare, lid lag or exophthalmos.  EOM intact.  PERRL.  Ears, Nose, Throat: Hearing: Grossly intact bilaterally Dental: Good dentition  Throat: Clear without mass, erythema or exudate  Neck: General: Supple without adenopathy. Thyroid: Thyroid size normal.  No goiter or nodules appreciated. No thyroid bruit.  Lungs: Clear with good BS bilat with no rales, rhonchi, or wheezes  Heart: Auscultation: RRR.  Abdomen: Normoactive bowel sounds, soft, nontender, without masses or organomegaly palpable  Extremities: Gait and station: Normal gait  Digits and nails: No clubbing, cyanosis, petechiae, or nodes Head and neck: Normal alignment and mobility BL UE: Normal ROM and strength. BL LE: No pretibial edema normal ROM and strength.  Skin: Hair: Texture and amount normal with gender appropriate distribution Skin Inspection: No rashes, acanthosis nigricans/skin tags. No lipohypertrophy Skin Palpation: Skin temperature, texture, and thickness normal to palpation  Neuro: Cranial nerves: II - XII grossly intact  Cerebellar: Normal coordination and movement; no tremor  Motor: Normal strength throughout DTRs: 2+ and symmetric in UE without delay in relaxation phase  Mental Status: Judgment, insight: Intact Orientation: Oriented to time, place, and person Memory: Intact for recent and remote events Mood and affect: No depression, anxiety, or agitation     DATA REVIEWED:   Results for DEBE, ANFINSON (MRN [id number]) as of 02/13/2019 07:38  Ref. Range 01/03/2019 08:14  Total CHOL/HDL Ratio Latest Ref Range: <5.0 (calc) 4.0  Cholesterol Latest Ref Range: <200 mg/dL 170  HDL Cholesterol Latest Ref Range: > OR = 50 mg/dL 43 (L)  LDL Cholesterol (Calc) Latest Units: mg/dL (calc) 110 (H)  Non-HDL Cholesterol (Calc) Latest Ref Range: <130 mg/dL (calc) 127  Triglycerides Latest Ref Range: <150 mg/dL 81  Alkaline phosphatase (APISO) Latest Ref Range: 37 - 153 U/L 97  Vitamin D, 25-Hydroxy Latest Ref Range: 30 - 100 ng/mL 43  Globulin Latest Ref Range: 1.9 - 3.7 g/dL (calc) 2.9  WBC Latest Ref Range: 3.8 - 10.8 Thousand/uL 4.5  RBC Latest Ref Range: 3.80 - 5.10 Million/uL 4.93  Hemoglobin Latest Ref Range: 11.7 - 15.5 g/dL 13.3  HCT Latest Ref Range: 35.0 - 45.0 % 40.5  MCV Latest Ref Range: 80.0 - 100.0 fL 82.2  MCH Latest Ref Range: 27.0 - 33.0 pg 27.0  MCHC Latest Ref Range: 32.0 - 36.0 g/dL 32.8  RDW Latest Ref Range: 11.0 - 15.0 % 14.5  Platelets Latest Ref Range: 140 - 400 Thousand/uL 294  MPV Latest Ref Range: 7.5 - 12.5 fL 12.4  Neutrophils Latest Units: % 54.6  Monocytes Relative Latest Units: % 8.5  Eosinophil Latest Units: % 5.2  Basophil Latest Units: % 0.7  NEUT# Latest Ref Range: 1,500 - 7,800 cells/uL 2,457  Lymphocyte # Latest Ref Range: 850 - 3,900 cells/uL 1,395  Total Lymphocyte Latest Units: % 31.0  Eosinophils Absolute Latest Ref Range: 15 - 500 cells/uL 234  Basophils Absolute Latest Ref Range: 0 - 200 cells/uL 32  Absolute Monocytes Latest Ref Range: 200 - 950 cells/uL 383  Glucose Latest Ref Range: 65 - 99 mg/dL 105 (H)  TSH  Latest Ref Range: 0.40 - 4.50 mIU/L 2.20  Albumin MSPROF Latest Ref Range: 3.6 - 5.1 g/dL 4.3   Results for ADILYNNE, FITZWATER (MRN [id number]) as of 02/13/2019 14:18  Ref. Range 02/13/2019 07:58  TSH Latest Ref Range: 0.35 - 4.50 uIU/mL 1.96  T4,Free(Direct) Latest Ref Range: 0.60 - 1.60 ng/dL 1.06   ASSESSMENT/PLAN/RECOMMENDATIONS:   1. Hx papillary thyroid carcinoma, S/P right lobectomy in 2016:  Stage T1 N0 M0  -She is clinically and biochemically euthyroid -She denies any local neck symptoms -Has 2 left thyroid nodules, both have been biopsied in 2016 with benign cytology, as per patient left thyroid nodules have been biopsied in [place] in 2017 with benign cytology as well. -We will repeat thyroid ultrasound -We will obtain thyroglobulin antibody and thyroglobulin levels as a baseline. -She has never been on L T4 replacement. -This is a low risk tumor with low recurrence -TSH goal is 0.5-2 uIU/mL   2. Hx of parathyroid adenoma :  - S/P right parathyroidectomy in 2016 - Serum calcium has been normal.  - No further intervention needed at this time    F/u in 6 months.   Signed electronically by: Mack Guise, MD  [facility] Endocrinology  [facility] [address] [place], [place] [address] Phone: [phone] FAX: [phone]   CC: Lauree Chandler, NP [place] [address] Phone: [phone] Fax: [phone]   Return to Endocrinology clinic as below: Future Appointments  Date Time Provider [place]  07/06/2019  8:45 AM [facility] None  07/11/2019  8:30 AM Lauree Chandler, NP [facility] None

## 2019-02-14 LAB — THYROGLOBULIN LEVEL: Thyroglobulin: 66.1 ng/mL — ABNORMAL HIGH

## 2019-02-14 LAB — COLOGUARD: Cologuard: NEGATIVE

## 2019-02-14 LAB — THYROGLOBULIN ANTIBODY: Thyroglobulin Ab: <1 IU/mL (ref <=1)

## 2019-02-17 ENCOUNTER — Encounter: Payer: Self-pay | Admitting: *Deleted

## 2019-02-23 ENCOUNTER — Other Ambulatory Visit

## 2019-03-18 ENCOUNTER — Other Ambulatory Visit: Payer: Self-pay | Admitting: Nurse Practitioner

## 2019-03-26 ENCOUNTER — Encounter: Payer: Self-pay | Admitting: Nurse Practitioner

## 2019-04-21 ENCOUNTER — Ambulatory Visit

## 2019-04-25 ENCOUNTER — Other Ambulatory Visit

## 2019-05-09 ENCOUNTER — Other Ambulatory Visit

## 2019-07-04 ENCOUNTER — Ambulatory Visit
Admission: RE | Admit: 2019-07-04 | Discharge: 2019-07-04 | Disposition: A | Discharge status: Home / Self Care | Source: Ambulatory Visit | Attending: Nurse Practitioner | Admitting: Nurse Practitioner

## 2019-07-04 ENCOUNTER — Other Ambulatory Visit: Payer: Self-pay

## 2019-07-04 DIAGNOSIS — Z1231 Encounter for screening mammogram for malignant neoplasm of breast: Secondary | ICD-10-CM

## 2019-07-06 ENCOUNTER — Other Ambulatory Visit: Payer: Self-pay

## 2019-07-06 ENCOUNTER — Other Ambulatory Visit

## 2019-07-06 DIAGNOSIS — E782 Mixed hyperlipidemia: Secondary | ICD-10-CM

## 2019-07-06 DIAGNOSIS — R739 Hyperglycemia, unspecified: Secondary | ICD-10-CM

## 2019-07-07 LAB — LIPID PANEL
Cholesterol: 174 mg/dL (ref <200)
HDL: 42 mg/dL — ABNORMAL LOW (ref >=50)
LDL Cholesterol (Calc): 118 mg/dL (calc) — ABNORMAL HIGH
Non-HDL Cholesterol (Calc): 132 mg/dL (calc) — ABNORMAL HIGH (ref <130)
Total CHOL/HDL Ratio: 4.1 (calc) (ref <5.0)
Triglycerides: 58 mg/dL (ref <150)

## 2019-07-07 LAB — HEMOGLOBIN A1C
Hgb A1c MFr Bld: 5.9 % of total Hgb — ABNORMAL HIGH (ref <5.7)
Mean Plasma Glucose: 123 (calc)
eAG (mmol/L): 6.8 (calc)

## 2019-07-07 LAB — COMPLETE METABOLIC PANEL WITH GFR
AG Ratio: 1.4 (calc) (ref 1.0–2.5)
ALT: 18 U/L (ref 6–29)
AST: 19 U/L (ref 10–35)
Albumin: 4.2 g/dL (ref 3.6–5.1)
Alkaline phosphatase (APISO): 91 U/L (ref 37–153)
BUN: 13 mg/dL (ref 7–25)
CO2: 30 mmol/L (ref 20–32)
Calcium: 9.2 mg/dL (ref 8.6–10.4)
Chloride: 104 mmol/L (ref 98–110)
Creat: 0.69 mg/dL (ref 0.50–0.99)
GFR, Est African American: 107 mL/min/{1.73_m2} (ref >=60)
GFR, Est Non African American: 93 mL/min/{1.73_m2} (ref >=60)
Globulin: 3 g/dL (calc) (ref 1.9–3.7)
Glucose, Bld: 99 mg/dL (ref 65–99)
Potassium: 3.7 mmol/L (ref 3.5–5.3)
Sodium: 142 mmol/L (ref 135–146)
Total Bilirubin: 0.5 mg/dL (ref 0.2–1.2)
Total Protein: 7.2 g/dL (ref 6.1–8.1)

## 2019-07-11 ENCOUNTER — Other Ambulatory Visit: Payer: Self-pay

## 2019-07-11 ENCOUNTER — Encounter: Payer: Self-pay | Admitting: Nurse Practitioner

## 2019-07-11 ENCOUNTER — Ambulatory Visit (INDEPENDENT_AMBULATORY_CARE_PROVIDER_SITE_OTHER): Admitting: Nurse Practitioner

## 2019-07-11 VITALS — BP 142/88 | HR 96 | Temp 98.1°F | Ht 67.0 in | Wt 248.0 lb

## 2019-07-11 DIAGNOSIS — E214 Other specified disorders of parathyroid gland: Secondary | ICD-10-CM

## 2019-07-11 DIAGNOSIS — R739 Hyperglycemia, unspecified: Secondary | ICD-10-CM | POA: Diagnosis not present

## 2019-07-11 DIAGNOSIS — M1990 Unspecified osteoarthritis, unspecified site: Secondary | ICD-10-CM

## 2019-07-11 DIAGNOSIS — I10 Essential (primary) hypertension: Secondary | ICD-10-CM | POA: Diagnosis not present

## 2019-07-11 DIAGNOSIS — E782 Mixed hyperlipidemia: Secondary | ICD-10-CM | POA: Diagnosis not present

## 2019-07-11 NOTE — Progress Notes (Signed)
Careteam: Patient Care Team: Lauree Chandler, NP as PCP - General (Geriatric Medicine)  Advanced Directive information Does Patient Have a Medical Advance Directive?: No, Would patient like information on creating a medical advance directive?: Yes (MAU/Ambulatory/Procedural Areas - Information given)  Allergies  Allergen Reactions  . Skin Adhesives [Cyanoacrylate] Itching    Chief Complaint  Patient presents with  . Medical Management of Chronic Issues    6 month folow-up and discuss labs(copy printed)      HPI: Patient is a 63 y.o. female seen in the office today for routine follow up.   Parathyroid cyst- endocrine referral placed, she was evaluated by endocrinologist who followed up with labs and has ultrasound scheduled.   HTN- controlled on norvasc and triamterene-hctz; did not take blood pressure medication today therefore high.  OA- ongoing, flares with weather. Will take ibuprofen if needed.   Hyperglycemia- A1c 5.9  Hyperlipidemia- LDL at 118 up from 110  Exercise is limited due to COVID- she mows grass often to get some exercise in her yard.  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  HENT: Negative for tinnitus.   Respiratory: Negative for cough, sputum production and shortness of breath.   Cardiovascular: Negative for chest pain, palpitations and leg swelling.  Gastrointestinal: Negative for abdominal pain, constipation, diarrhea and heartburn.  Genitourinary: Negative for dysuria, frequency and urgency.  Musculoskeletal: Positive for joint pain. Negative for back pain, falls and myalgias.  Skin: Negative.   Neurological: Negative for dizziness and headaches.  Psychiatric/Behavioral: Negative for depression and memory loss. The patient does not have insomnia.    Past Medical History:  Diagnosis Date  . Arthritis    "right knee"  (05/27/2015)  . Family history of adverse reaction to anesthesia    "it's hard to bring my father back; more than once too" (05/27/2015)  . GERD (gastroesophageal reflux disease)   . Hypercalcemia    parathyroid gland removed  . Hypertension    controlled with medication  . Obesity (BMI 30-39.9)   . Parathyroid cyst ([place])   . Superficial fungus infection of skin    on chest  . Thyroid cancer ([facility]) 2016  . Thyroid mass    "2"   Past Surgical History:  Procedure Laterality Date  . CESAREAN SECTION  1997  . IRRIGATION AND DEBRIDEMENT SEBACEOUS CYST  ~ 2010   on back  . PARATHYROIDECTOMY Right 05/27/2015  . PARATHYROIDECTOMY Right 05/27/2015   Procedure: RIGHT PARATHYROIDECTOMY WITH FROZEN SECTION;  Surgeon: Izora Gala, MD;  Location: [facility];  Service: ENT;  Laterality: Right;  . THYROID LOBECTOMY Right 05/27/2015  . THYROIDECTOMY Right 05/27/2015   Procedure: RIGHT THYROID LOBECTOMY WITH FROZEN SECTION;  Surgeon: Izora Gala, MD;  Location: [facility];  Service: ENT;  Laterality: Right;  . TUBAL LIGATION  1997   Social History:   reports that she has quit smoking. Her smoking use included cigarettes. She has a 1.00 pack-year smoking history. She has never used smokeless tobacco. She reports current alcohol use. She reports that she does not use drugs.  Family History  Problem Relation Age of Onset  . Congestive Heart Failure Mother   . Diabetes Mother 63  . Hypertension Mother   . Congestive Heart Failure Father   . Multiple myeloma Brother 64  . Diabetes Sister 21  . Hypertension Sister   . Hypertension  Brother   . Hypertension Brother   . Hyperlipidemia Brother   . Hypertension Brother   . Hyperlipidemia Brother     Medications: Patient's Medications  New Prescriptions   No medications on file  Previous Medications   AMLODIPINE (NORVASC) 10 MG TABLET    TAKE 1 TABLET DAILY   MULTIPLE VITAMINS-MINERALS (MULTIVITAMIN ADULTS 50+ PO)    Take by mouth daily.   ST JOHNS WORT 300  MG CAPS    Take 1 capsule by mouth daily.   TRIAMTERENE-HYDROCHLOROTHIAZIDE (MAXZIDE-25) 37.5-25 MG TABLET    TAKE 1 TABLET DAILY  Modified Medications   No medications on file  Discontinued Medications   No medications on file    Physical Exam:  Vitals:   07/11/19 0839  BP: (!) 142/88  Pulse: 96  Temp: 98.1 F (36.7 C)  TempSrc: Oral  SpO2: 98%  Weight: 248 lb (112.5 kg)  Height: '5\' 7"'$  (1.702 m)   Body mass index is 38.84 kg/m. Wt Readings from Last 3 Encounters:  07/11/19 248 lb (112.5 kg)  02/13/19 258 lb 9.6 oz (117.3 kg)  01/10/19 257 lb 9.6 oz (116.8 kg)    Physical Exam Constitutional:      General: She is not in acute distress.    Appearance: She is well-developed. She is not diaphoretic.  HENT:     Head: Normocephalic and atraumatic.     Mouth/Throat:     Pharynx: No oropharyngeal exudate.  Eyes:     Conjunctiva/sclera: Conjunctivae normal.     Pupils: Pupils are equal, round, and reactive to light.  Neck:     Musculoskeletal: Normal range of motion and neck supple.  Cardiovascular:     Rate and Rhythm: Normal rate and regular rhythm.     Heart sounds: Normal heart sounds.  Pulmonary:     Effort: Pulmonary effort is normal.     Breath sounds: Normal breath sounds.  Abdominal:     General: Bowel sounds are normal.     Palpations: Abdomen is soft.  Musculoskeletal:        General: No swelling or tenderness.     Right lower leg: No edema.     Left lower leg: No edema.  Skin:    General: Skin is warm and dry.  Neurological:     Mental Status: She is alert and oriented to person, place, and time.  Psychiatric:        Mood and Affect: Mood normal.        Behavior: Behavior normal.     Labs reviewed: Basic Metabolic Panel: Recent Labs    01/03/19 0814 02/13/19 0758 07/06/19 0844  NA 141  --  142  K 3.7  --  3.7  CL 104  --  104  CO2 30  --  30  GLUCOSE 105*  --  99  BUN 10  --  13  CREATININE 0.70  --  0.69  CALCIUM 9.6  --  9.2  TSH  2.20 1.96  --    Liver Function Tests: Recent Labs    01/03/19 0814 07/06/19 0844  AST 21 19  ALT 22 18  BILITOT 0.6 0.5  PROT 7.2 7.2   No results for input(s): LIPASE, AMYLASE in the last 8760 hours. No results for input(s): AMMONIA in the last 8760 hours. CBC: Recent Labs    01/03/19 0814  WBC 4.5  NEUTROABS 2,457  HGB 13.3  HCT 40.5  MCV 82.2  PLT 294   Lipid Panel: Recent  Labs    01/03/19 0814 07/06/19 0844  CHOL 170 174  HDL 43* 42*  LDLCALC 110* 118*  TRIG 81 58  CHOLHDL 4.0 4.1   TSH: Recent Labs    01/03/19 0814 02/13/19 0758  TSH 2.20 1.96   A1C: Lab Results  Component Value Date   HGBA1C 5.9 (H) 07/06/2019     Assessment/Plan 1. Essential hypertension -did not take blood pressure this morning. Continue current medications. Goal bp <140/90. Encouraged lifestyle modifications for optimal control. - CBC with Differential/Platelet; Future  2. Arthritis Stable at this time, without worsening of symptoms.   3. Mixed hyperlipidemia -LDL trending up, she is willing to make lifestyle modifications at this time. - Lipid Panel; Future - COMPLETE METABOLIC PANEL WITH GFR; Future  4. Hyperglycemia -discussed dietary and exercise to help control blood sugars. - Hemoglobin A1c; Future  5. Parathyroid cyst [facility]) -followed by endocrinology  Next appt: 6 months. Carlos American. [place], [facility] [phone]

## 2019-07-11 NOTE — Patient Instructions (Signed)

## 2019-07-25 ENCOUNTER — Ambulatory Visit (INDEPENDENT_AMBULATORY_CARE_PROVIDER_SITE_OTHER)

## 2019-07-25 ENCOUNTER — Other Ambulatory Visit: Payer: Self-pay

## 2019-07-25 DIAGNOSIS — Z23 Encounter for immunization: Secondary | ICD-10-CM

## 2019-08-16 ENCOUNTER — Ambulatory Visit: Admitting: Internal Medicine

## 2019-08-21 ENCOUNTER — Other Ambulatory Visit

## 2019-08-25 ENCOUNTER — Ambulatory Visit
Admission: RE | Admit: 2019-08-25 | Discharge: 2019-08-25 | Disposition: A | Discharge status: Home / Self Care | Source: Ambulatory Visit | Attending: Internal Medicine | Admitting: Internal Medicine

## 2019-08-25 DIAGNOSIS — C73 Malignant neoplasm of thyroid gland: Secondary | ICD-10-CM

## 2019-08-29 ENCOUNTER — Telehealth: Payer: Self-pay | Admitting: Internal Medicine

## 2019-08-29 DIAGNOSIS — E041 Nontoxic single thyroid nodule: Secondary | ICD-10-CM

## 2019-08-29 DIAGNOSIS — C73 Malignant neoplasm of thyroid gland: Secondary | ICD-10-CM

## 2019-08-29 NOTE — Telephone Encounter (Signed)
Discussed ultrasound results of the thyroid . The report stated the nodules are stable but in review of the left mid nodule there's a 20% increase in at least 2 dimensions and I have recommended proceeding with FNA  Given her hx of papillary carcinoma I have a low threshold in monitoring her nodules.     Pt in agreement     [place], MD  [facility] Endocrinology  [facility] [address] [place], [place] [address] Phone: [phone] FAX: [phone]

## 2019-09-20 ENCOUNTER — Other Ambulatory Visit (HOSPITAL_COMMUNITY)
Admission: RE | Admit: 2019-09-20 | Discharge: 2019-09-20 | Disposition: A | Discharge status: Home / Self Care | Payer: BC Managed Care – PPO | Source: Ambulatory Visit | Attending: Internal Medicine | Admitting: Internal Medicine

## 2019-09-20 ENCOUNTER — Ambulatory Visit
Admission: RE | Admit: 2019-09-20 | Discharge: 2019-09-20 | Disposition: A | Discharge status: Home / Self Care | Payer: BC Managed Care – PPO | Source: Ambulatory Visit | Attending: Internal Medicine | Admitting: Internal Medicine

## 2019-09-20 DIAGNOSIS — E041 Nontoxic single thyroid nodule: Secondary | ICD-10-CM | POA: Insufficient documentation

## 2019-09-21 LAB — CYTOLOGY - NON PAP

## 2019-09-25 ENCOUNTER — Telehealth: Payer: Self-pay | Admitting: Internal Medicine

## 2019-09-25 NOTE — Telephone Encounter (Signed)
Please let her know her thyroid nodule biopsy is benign, which is great news.    No further intervention at this time.    Thanks   Abby Nena Jordan, MD  [facility] Endocrinology  [facility] [address] [place], [place] [address] Phone: [phone] FAX: [phone]

## 2019-09-25 NOTE — Telephone Encounter (Signed)
Notified patient of message, patient expressed understanding and agreement. No further questions.

## 2020-01-09 ENCOUNTER — Other Ambulatory Visit

## 2020-01-09 ENCOUNTER — Other Ambulatory Visit: Payer: Self-pay

## 2020-01-09 DIAGNOSIS — E782 Mixed hyperlipidemia: Secondary | ICD-10-CM

## 2020-01-09 DIAGNOSIS — R739 Hyperglycemia, unspecified: Secondary | ICD-10-CM

## 2020-01-09 DIAGNOSIS — I10 Essential (primary) hypertension: Secondary | ICD-10-CM

## 2020-01-10 LAB — CBC WITH DIFFERENTIAL/PLATELET
Absolute Monocytes: 366 cells/uL (ref 200–950)
Basophils Absolute: 30 cells/uL (ref 0–200)
Basophils Relative: 0.7 %
Eosinophils Absolute: 181 cells/uL (ref 15–500)
Eosinophils Relative: 4.2 %
HCT: 41.1 % (ref 35.0–45.0)
Hemoglobin: 13.5 g/dL (ref 11.7–15.5)
Lymphs Abs: 1423 cells/uL (ref 850–3900)
MCH: 27.3 pg (ref 27.0–33.0)
MCHC: 32.8 g/dL (ref 32.0–36.0)
MCV: 83.2 fL (ref 80.0–100.0)
MPV: 12.6 fL — ABNORMAL HIGH (ref 7.5–12.5)
Monocytes Relative: 8.5 %
Neutro Abs: 2301 cells/uL (ref 1500–7800)
Neutrophils Relative %: 53.5 %
Platelets: 257 10*3/uL (ref 140–400)
RBC: 4.94 10*6/uL (ref 3.80–5.10)
RDW: 14.6 % (ref 11.0–15.0)
Total Lymphocyte: 33.1 %
WBC: 4.3 10*3/uL (ref 3.8–10.8)

## 2020-01-10 LAB — COMPLETE METABOLIC PANEL WITH GFR
AG Ratio: 1.4 (calc) (ref 1.0–2.5)
ALT: 20 U/L (ref 6–29)
AST: 20 U/L (ref 10–35)
Albumin: 4.2 g/dL (ref 3.6–5.1)
Alkaline phosphatase (APISO): 96 U/L (ref 37–153)
BUN: 13 mg/dL (ref 7–25)
CO2: 30 mmol/L (ref 20–32)
Calcium: 9.4 mg/dL (ref 8.6–10.4)
Chloride: 104 mmol/L (ref 98–110)
Creat: 0.71 mg/dL (ref 0.50–0.99)
GFR, Est African American: 105 mL/min/{1.73_m2} (ref >=60)
GFR, Est Non African American: 91 mL/min/{1.73_m2} (ref >=60)
Globulin: 3 g/dL (calc) (ref 1.9–3.7)
Glucose, Bld: 91 mg/dL (ref 65–99)
Potassium: 3.7 mmol/L (ref 3.5–5.3)
Sodium: 142 mmol/L (ref 135–146)
Total Bilirubin: 0.6 mg/dL (ref 0.2–1.2)
Total Protein: 7.2 g/dL (ref 6.1–8.1)

## 2020-01-10 LAB — LIPID PANEL
Cholesterol: 174 mg/dL (ref <200)
HDL: 43 mg/dL — ABNORMAL LOW (ref >=50)
LDL Cholesterol (Calc): 115 mg/dL (calc) — ABNORMAL HIGH
Non-HDL Cholesterol (Calc): 131 mg/dL (calc) — ABNORMAL HIGH (ref <130)
Total CHOL/HDL Ratio: 4 (calc) (ref <5.0)
Triglycerides: 64 mg/dL (ref <150)

## 2020-01-10 LAB — HEMOGLOBIN A1C
Hgb A1c MFr Bld: 5.8 % of total Hgb — ABNORMAL HIGH (ref <5.7)
Mean Plasma Glucose: 120 (calc)
eAG (mmol/L): 6.6 (calc)

## 2020-01-16 ENCOUNTER — Ambulatory Visit: Admitting: Nurse Practitioner

## 2020-01-17 ENCOUNTER — Ambulatory Visit (INDEPENDENT_AMBULATORY_CARE_PROVIDER_SITE_OTHER): Payer: BC Managed Care – PPO | Admitting: Nurse Practitioner

## 2020-01-17 ENCOUNTER — Encounter: Payer: Self-pay | Admitting: Nurse Practitioner

## 2020-01-17 ENCOUNTER — Other Ambulatory Visit: Payer: Self-pay

## 2020-01-17 VITALS — BP 130/88 | HR 89 | Temp 97.3°F | Ht 67.0 in | Wt 248.8 lb

## 2020-01-17 DIAGNOSIS — R739 Hyperglycemia, unspecified: Secondary | ICD-10-CM | POA: Diagnosis not present

## 2020-01-17 DIAGNOSIS — Z114 Encounter for screening for human immunodeficiency virus [HIV]: Secondary | ICD-10-CM

## 2020-01-17 DIAGNOSIS — I10 Essential (primary) hypertension: Secondary | ICD-10-CM | POA: Diagnosis not present

## 2020-01-17 DIAGNOSIS — E782 Mixed hyperlipidemia: Secondary | ICD-10-CM

## 2020-01-17 DIAGNOSIS — Z6838 Body mass index (BMI) 38.0-38.9, adult: Secondary | ICD-10-CM

## 2020-01-17 DIAGNOSIS — Z1159 Encounter for screening for other viral diseases: Secondary | ICD-10-CM

## 2020-01-17 NOTE — Patient Instructions (Addendum)
COVID-19 Vaccine Information can be found at: [email or web address] For questions related to vaccine distribution or appointments, please email [email or web address] or call [phone].   TO GET TDAP AT PHARMACY  Heart-Healthy Eating Plan Many factors influence your heart (coronary) health, including eating and exercise habits. Coronary risk increases with abnormal blood fat (lipid) levels. Heart-healthy meal planning includes limiting unhealthy fats, increasing healthy fats, and making other diet and lifestyle changes.  What are tips for following this plan? Cooking Cook foods using methods other than frying. Baking, boiling, grilling, and broiling are all good options. Other ways to reduce fat include:  Removing the skin from poultry.  Removing all visible fats from meats.  Steaming vegetables in water or broth. Meal planning   At meals, imagine dividing your plate into fourths: ? Fill one-half of your plate with vegetables and green salads. ? Fill one-fourth of your plate with whole grains. ? Fill one-fourth of your plate with lean protein foods.  Eat 4-5 servings of vegetables per day. One serving equals 1 cup raw or cooked vegetable, or 2 cups raw leafy greens.  Eat 4-5 servings of fruit per day. One serving equals 1 medium whole fruit,  cup dried fruit,  cup fresh, frozen, or canned fruit, or  cup 100% fruit juice.  Eat more foods that contain soluble fiber. Examples include apples, broccoli, carrots, beans, peas, and barley. Aim to get 25-30 g of fiber per day.  Increase your consumption of legumes, nuts, and seeds to 4-5 servings per week. One serving of dried beans or legumes equals  cup cooked, 1 serving of nuts is  cup, and 1 serving of seeds equals 1 tablespoon. Fats  Choose healthy fats more often. Choose monounsaturated and polyunsaturated fats, such as olive and canola oils, flaxseeds, walnuts, almonds,  and seeds.  Eat more omega-3 fats. Choose salmon, mackerel, sardines, tuna, flaxseed oil, and ground flaxseeds. Aim to eat fish at least 2 times each week.  Check food labels carefully to identify foods with trans fats or high amounts of saturated fat.  Limit saturated fats. These are found in animal products, such as meats, butter, and cream. Plant sources of saturated fats include palm oil, palm kernel oil, and coconut oil.  Avoid foods with partially hydrogenated oils in them. These contain trans fats. Examples are stick margarine, some tub margarines, cookies, crackers, and other baked goods.  Avoid fried foods. General information  Eat more home-cooked food and less restaurant, buffet, and fast food.  Limit or avoid alcohol.  Limit foods that are high in starch and sugar.  Lose weight if you are overweight. Losing just 5-10% of your body weight can help your overall health and prevent diseases such as diabetes and heart disease.  Monitor your salt (sodium) intake, especially if you have high blood pressure. Talk with your health care provider about your sodium intake.  Try to incorporate more vegetarian meals weekly. What foods can I eat? Fruits All fresh, canned (in natural juice), or frozen fruits. Vegetables Fresh or frozen vegetables (raw, steamed, roasted, or grilled). Green salads. Grains Most grains. Choose whole wheat and whole grains most of the time. Rice and pasta, including brown rice and pastas made with whole wheat. Meats and other proteins Lean, well-trimmed beef, veal, pork, and lamb. Chicken and [place] without skin. All fish and shellfish. Wild duck, rabbit, pheasant, and venison. Egg whites or low-cholesterol egg substitutes. Dried beans, peas, lentils, and tofu. Seeds and most nuts. Dairy Low-fat or nonfat cheeses, including ricotta  and mozzarella. Skim or 1% milk (liquid, powdered, or evaporated). Buttermilk made with low-fat milk. Nonfat or low-fat  yogurt. Fats and oils Non-hydrogenated (trans-free) margarines. Vegetable oils, including soybean, sesame, sunflower, olive, peanut, safflower, corn, canola, and cottonseed. Salad dressings or mayonnaise made with a vegetable oil. Beverages Water (mineral or sparkling). Coffee and tea. Diet carbonated beverages. Sweets and desserts Sherbet, gelatin, and fruit ice. Small amounts of dark chocolate. Limit all sweets and desserts. Seasonings and condiments All seasonings and condiments. The items listed above may not be a complete list of foods and beverages you can eat. Contact a dietitian for more options. What foods are not recommended? Fruits Canned fruit in heavy syrup. Fruit in cream or butter sauce. Fried fruit. Limit coconut. Vegetables Vegetables cooked in cheese, cream, or butter sauce. Fried vegetables. Grains Breads made with saturated or trans fats, oils, or whole milk. Croissants. Sweet rolls. Donuts. High-fat crackers, such as cheese crackers. Meats and other proteins Fatty meats, such as hot dogs, ribs, sausage, bacon, rib-eye roast or steak. High-fat deli meats, such as salami and bologna. Caviar. Domestic duck and goose. Organ meats, such as liver. Dairy Cream, sour cream, cream cheese, and creamed cottage cheese. Whole milk cheeses. Whole or 2% milk (liquid, evaporated, or condensed). Whole buttermilk. Cream sauce or high-fat cheese sauce. Whole-milk yogurt. Fats and oils Meat fat, or shortening. Cocoa butter, hydrogenated oils, palm oil, coconut oil, palm kernel oil. Solid fats and shortenings, including bacon fat, salt pork, lard, and butter. Nondairy cream substitutes. Salad dressings with cheese or sour cream. Beverages Regular sodas and any drinks with added sugar. Sweets and desserts Frosting. Pudding. Cookies. Cakes. Pies. Milk chocolate or white chocolate. Buttered syrups. Full-fat ice cream or ice cream drinks. The items listed above may not be a complete list of  foods and beverages to avoid. Contact a dietitian for more information. Summary  Heart-healthy meal planning includes limiting unhealthy fats, increasing healthy fats, and making other diet and lifestyle changes.  Lose weight if you are overweight. Losing just 5-10% of your body weight can help your overall health and prevent diseases such as diabetes and heart disease.  Focus on eating a balance of foods, including fruits and vegetables, low-fat or nonfat dairy, lean protein, nuts and legumes, whole grains, and heart-healthy oils and fats. This information is not intended to replace advice given to you by your health care provider. Make sure you discuss any questions you have with your health care provider. Document Revised: 12/24/2017 Document Reviewed: 12/24/2017 Elsevier Patient Education  2020 [employer] Calories are units of energy. Your body needs a certain amount of calories from food to keep you going throughout the day. When you eat more calories than your body needs, your body stores the extra calories as fat. When you eat fewer calories than your body needs, your body burns fat to get the energy it needs. Calorie counting means keeping track of how many calories you eat and drink each day. Calorie counting can be helpful if you need to lose weight. If you make sure to eat fewer calories than your body needs, you should lose weight. Ask your health care provider what a healthy weight is for you. For calorie counting to work, you will need to eat the right number of calories in a day in order to lose a healthy amount of weight per week. A dietitian can help you determine how many calories you need in a day  and will give you suggestions on how to reach your calorie goal.  A healthy amount of weight to lose per week is usually 1-2 lb (0.5-0.9 kg). This usually means that your daily calorie intake should be reduced by 500-750 calories.  Eating 1,200 -  1,500 calories per day can help most women lose weight.  Eating 1,500 - 1,800 calories per day can help most men lose weight. What is my plan? My goal is to have __________ calories per day. If I have this many calories per day, I should lose around __________ pounds per week. What do I need to know about calorie counting? In order to meet your daily calorie goal, you will need to:  Find out how many calories are in each food you would like to eat. Try to do this before you eat.  Decide how much of the food you plan to eat.  Write down what you ate and how many calories it had. Doing this is called keeping a food log. To successfully lose weight, it is important to balance calorie counting with a healthy lifestyle that includes regular activity. Aim for 150 minutes of moderate exercise (such as walking) or 75 minutes of vigorous exercise (such as running) each week. Where do I find calorie information?  The number of calories in a food can be found on a Nutrition Facts label. If a food does not have a Nutrition Facts label, try to look up the calories online or ask your dietitian for help. Remember that calories are listed per serving. If you choose to have more than one serving of a food, you will have to multiply the calories per serving by the amount of servings you plan to eat. For example, the label on a package of bread might say that a serving size is 1 slice and that there are 90 calories in a serving. If you eat 1 slice, you will have eaten 90 calories. If you eat 2 slices, you will have eaten 180 calories. How do I keep a food log? Immediately after each meal, record the following information in your food log:  What you ate. Don't forget to include toppings, sauces, and other extras on the food.  How much you ate. This can be measured in cups, ounces, or number of items.  How many calories each food and drink had.  The total number of calories in the meal. Keep your food log  near you, such as in a small notebook in your pocket, or use a mobile app or website. Some programs will calculate calories for you and show you how many calories you have left for the day to meet your goal. What are some calorie counting tips?   Use your calories on foods and drinks that will fill you up and not leave you hungry: ? Some examples of foods that fill you up are nuts and nut butters, vegetables, lean proteins, and high-fiber foods like whole grains. High-fiber foods are foods with more than 5 g fiber per serving. ? Drinks such as sodas, specialty coffee drinks, alcohol, and juices have a lot of calories, yet do not fill you up.  Eat nutritious foods and avoid empty calories. Empty calories are calories you get from foods or beverages that do not have many vitamins or protein, such as candy, sweets, and soda. It is better to have a nutritious high-calorie food (such as an avocado) than a food with few nutrients (such as a bag of chips).  Know how many calories are in the foods you eat most often. This will help you calculate calorie counts faster.  Pay attention to calories in drinks. Low-calorie drinks include water and unsweetened drinks.  Pay attention to nutrition labels for "low fat" or "fat free" foods. These foods sometimes have the same amount of calories or more calories than the full fat versions. They also often have added sugar, starch, or salt, to make up for flavor that was removed with the fat.  Find a way of tracking calories that works for you. Get creative. Try different apps or programs if writing down calories does not work for you. What are some portion control tips?  Know how many calories are in a serving. This will help you know how many servings of a certain food you can have.  Use a measuring cup to measure serving sizes. You could also try weighing out portions on a kitchen scale. With time, you will be able to estimate serving sizes for some  foods.  Take some time to put servings of different foods on your favorite plates, bowls, and cups so you know what a serving looks like.  Try not to eat straight from a bag or box. Doing this can lead to overeating. Put the amount you would like to eat in a cup or on a plate to make sure you are eating the right portion.  Use smaller plates, glasses, and bowls to prevent overeating.  Try not to multitask (for example, watch TV or use your computer) while eating. If it is time to eat, sit down at a table and enjoy your food. This will help you to know when you are full. It will also help you to be aware of what you are eating and how much you are eating. What are tips for following this plan? Reading food labels  Check the calorie count compared to the serving size. The serving size may be smaller than what you are used to eating.  Check the source of the calories. Make sure the food you are eating is high in vitamins and protein and low in saturated and trans fats. Shopping  Read nutrition labels while you shop. This will help you make healthy decisions before you decide to purchase your food.  Make a grocery list and stick to it. Cooking  Try to cook your favorite foods in a healthier way. For example, try baking instead of frying.  Use low-fat dairy products. Meal planning  Use more fruits and vegetables. Half of your plate should be fruits and vegetables.  Include lean proteins like poultry and fish. How do I count calories when eating out?  Ask for smaller portion sizes.  Consider sharing an entree and sides instead of getting your own entree.  If you get your own entree, eat only half. Ask for a box at the beginning of your meal and put the rest of your entree in it so you are not tempted to eat it.  If calories are listed on the menu, choose the lower calorie options.  Choose dishes that include vegetables, fruits, whole grains, low-fat dairy products, and lean  protein.  Choose items that are boiled, broiled, grilled, or steamed. Stay away from items that are buttered, battered, fried, or served with cream sauce. Items labeled "crispy" are usually fried, unless stated otherwise.  Choose water, low-fat milk, unsweetened iced tea, or other drinks without added sugar. If you want an alcoholic beverage, choose a lower calorie  option such as a glass of wine or light beer.  Ask for dressings, sauces, and syrups on the side. These are usually high in calories, so you should limit the amount you eat.  If you want a salad, choose a garden salad and ask for grilled meats. Avoid extra toppings like bacon, cheese, or fried items. Ask for the dressing on the side, or ask for olive oil and vinegar or lemon to use as dressing.  Estimate how many servings of a food you are given. For example, a serving of cooked rice is  cup or about the size of half a baseball. Knowing serving sizes will help you be aware of how much food you are eating at restaurants. The list below tells you how big or small some common portion sizes are based on everyday objects: ? 1 oz--4 stacked dice. ? 3 oz--1 deck of cards. ? 1 tsp--1 die. ? 1 Tbsp-- a ping-pong ball. ? 2 Tbsp--1 ping-pong ball. ?  cup-- baseball. ? 1 cup--1 baseball. Summary  Calorie counting means keeping track of how many calories you eat and drink each day. If you eat fewer calories than your body needs, you should lose weight.  A healthy amount of weight to lose per week is usually 1-2 lb (0.5-0.9 kg). This usually means reducing your daily calorie intake by 500-750 calories.  The number of calories in a food can be found on a Nutrition Facts label. If a food does not have a Nutrition Facts label, try to look up the calories online or ask your dietitian for help.  Use your calories on foods and drinks that will fill you up, and not on foods and drinks that will leave you hungry.  Use smaller plates, glasses,  and bowls to prevent overeating. This information is not intended to replace advice given to you by your health care provider. Make sure you discuss any questions you have with your health care provider. Document Revised: 08/05/2018 Document Reviewed: 10/16/2016 Elsevier Patient Education  [place] and Cholesterol Restricted Eating Plan Getting too much fat and cholesterol in your diet may cause health problems. Choosing the right foods helps keep your fat and cholesterol at normal levels. This can keep you from getting certain diseases. Your doctor may recommend an eating plan that includes:  Total fat: ______% or less of total calories a day.  Saturated fat: ______% or less of total calories a day.  Cholesterol: less than _________mg a day.  Fiber: ______g a day. What are tips for following this plan? Meal planning  At meals, divide your plate into four equal parts: ? Fill one-half of your plate with vegetables and green salads. ? Fill one-fourth of your plate with whole grains. ? Fill one-fourth of your plate with low-fat (lean) protein foods.  Eat fish that is high in omega-3 fats at least two times a week. This includes mackerel, tuna, sardines, and salmon.  Eat foods that are high in fiber, such as whole grains, beans, apples, broccoli, carrots, peas, and barley. General tips   Work with your doctor to lose weight if you need to.  Avoid: ? Foods with added sugar. ? Fried foods. ? Foods with partially hydrogenated oils.  Limit alcohol intake to no more than 1 drink a day for nonpregnant women and 2 drinks a day for men. One drink equals 12 oz of beer, 5 oz of wine, or 1 oz of hard liquor. Reading food labels  Check  food labels for: ? Trans fats. ? Partially hydrogenated oils. ? Saturated fat (g) in each serving. ? Cholesterol (mg) in each serving. ? Fiber (g) in each serving.  Choose foods with healthy fats, such as: ? Monounsaturated  fats. ? Polyunsaturated fats. ? Omega-3 fats.  Choose grain products that have whole grains. Look for the word "whole" as the first word in the ingredient list. Cooking  Cook foods using low-fat methods. These include baking, boiling, grilling, and broiling.  Eat more home-cooked foods. Eat at restaurants and buffets less often.  Avoid cooking using saturated fats, such as butter, cream, palm oil, palm kernel oil, and coconut oil. Recommended foods  Fruits  All fresh, canned (in natural juice), or frozen fruits. Vegetables  Fresh or frozen vegetables (raw, steamed, roasted, or grilled). Green salads. Grains  Whole grains, such as whole wheat or whole grain breads, crackers, cereals, and pasta. Unsweetened oatmeal, bulgur, barley, quinoa, or brown rice. Corn or whole wheat flour tortillas. Meats and other protein foods  Ground beef (85% or leaner), grass-fed beef, or beef trimmed of fat. Skinless chicken or [place]. Ground chicken or [place]. Pork trimmed of fat. All fish and seafood. Egg whites. Dried beans, peas, or lentils. Unsalted nuts or seeds. Unsalted canned beans. Nut butters without added sugar or oil. Dairy  Low-fat or nonfat dairy products, such as skim or 1% milk, 2% or reduced-fat cheeses, low-fat and fat-free ricotta or cottage cheese, or plain low-fat and nonfat yogurt. Fats and oils  Tub margarine without trans fats. Light or reduced-fat mayonnaise and salad dressings. Avocado. Olive, canola, sesame, or safflower oils. The items listed above may not be a complete list of foods and beverages you can eat. Contact a dietitian for more information. Foods to avoid Fruits  Canned fruit in heavy syrup. Fruit in cream or butter sauce. Fried fruit. Vegetables  Vegetables cooked in cheese, cream, or butter sauce. Fried vegetables. Grains  White bread. White pasta. White rice. Cornbread. Bagels, pastries, and croissants. Crackers and snack foods that contain trans fat and  hydrogenated oils. Meats and other protein foods  Fatty cuts of meat. Ribs, chicken wings, bacon, sausage, bologna, salami, chitterlings, fatback, hot dogs, bratwurst, and packaged lunch meats. Liver and organ meats. Whole eggs and egg yolks. Chicken and [place] with skin. Fried meat. Dairy  Whole or 2% milk, cream, half-and-half, and cream cheese. Whole milk cheeses. Whole-fat or sweetened yogurt. Full-fat cheeses. Nondairy creamers and whipped toppings. Processed cheese, cheese spreads, and cheese curds. Beverages  Alcohol. Sugar-sweetened drinks such as sodas, lemonade, and fruit drinks. Fats and oils  Butter, stick margarine, lard, shortening, ghee, or bacon fat. Coconut, palm kernel, and palm oils. Sweets and desserts  Corn syrup, sugars, honey, and molasses. Candy. Jam and jelly. Syrup. Sweetened cereals. Cookies, pies, cakes, donuts, muffins, and ice cream. The items listed above may not be a complete list of foods and beverages you should avoid. Contact a dietitian for more information. Summary  Choosing the right foods helps keep your fat and cholesterol at normal levels. This can keep you from getting certain diseases.  At meals, fill one-half of your plate with vegetables and green salads.  Eat high-fiber foods, like whole grains, beans, apples, carrots, peas, and barley.  Limit added sugar, saturated fats, alcohol, and fried foods. This information is not intended to replace advice given to you by your health care provider. Make sure you discuss any questions you have with your health care provider. Document Revised: 07/20/2018 Document  Reviewed: 08/03/2017 Elsevier Patient Education  [employer].

## 2020-01-17 NOTE — Progress Notes (Signed)
Careteam: Patient Care Team: Lauree Chandler, NP as PCP - General (Geriatric Medicine)  Advanced Directive information Does Patient Have a Medical Advance Directive?: No, Does patient want to make changes to medical advance directive?: Yes (MAU/Ambulatory/Procedural Areas - Information given)  Allergies  Allergen Reactions  . Skin Adhesives [Cyanoacrylate] Itching    Chief Complaint  Patient presents with  . Medical Management of Chronic Issues    6 month follow-up and discuss labs (copy printed)   . Best Practice Recommendations    Discuss HIV and Hep Screening recommendations   . Immunizations    Discuss need for TD      HPI: Patient is a 64 y.o. female for routine follow up.   Hyperglycemia- hgb A1c stable, has made dietary modifications but not on a plan.   Hyperlipidemia- LDL 115, working on diet and exercise.  Started walking 10K daily.   OA- stable, occasionally flare with rain.   htn-did not take medication before she came to visit.   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    Review of Systems:  Review of Systems  Constitutional: Negative for chills, fever and weight loss.  HENT: Negative for tinnitus.   Respiratory: Negative for cough, sputum production and shortness of breath.   Cardiovascular: Negative for chest pain, palpitations and leg swelling.  Gastrointestinal: Negative for abdominal pain, constipation, diarrhea and heartburn.  Genitourinary: Negative for dysuria, frequency and urgency.  Musculoskeletal: Negative for back pain, falls, joint pain and myalgias.  Skin: Negative.   Neurological: Negative for dizziness and headaches.  Psychiatric/Behavioral: Negative for depression and memory loss. The patient does not have insomnia.    Past Medical History:  Diagnosis Date  . Arthritis    "right knee" (05/27/2015)  . Family history of adverse reaction to anesthesia    "it's hard to bring my father back;  more than once too" (05/27/2015)  . GERD (gastroesophageal reflux disease)   . Hypercalcemia    parathyroid gland removed  . Hypertension    controlled with medication  . Obesity (BMI 30-39.9)   . Parathyroid cyst ([place])   . Superficial fungus infection of skin    on chest  . Thyroid cancer ([place]) 2016  . Thyroid mass    "2"   Past Surgical History:  Procedure Laterality Date  . CESAREAN SECTION  1997  . IRRIGATION AND DEBRIDEMENT SEBACEOUS CYST  ~ 2010   on back  . PARATHYROIDECTOMY Right 05/27/2015  . PARATHYROIDECTOMY Right 05/27/2015   Procedure: RIGHT PARATHYROIDECTOMY WITH FROZEN SECTION;  Surgeon: Izora Gala, MD;  Location: [facility];  Service: ENT;  Laterality: Right;  . THYROID LOBECTOMY Right 05/27/2015  . THYROIDECTOMY Right 05/27/2015   Procedure: RIGHT THYROID LOBECTOMY WITH FROZEN SECTION;  Surgeon: Izora Gala, MD;  Location: [facility];  Service: ENT;  Laterality: Right;  . TUBAL LIGATION  1997   Social History:   reports that she has quit smoking. Her smoking use included cigarettes. She has a 1.00 pack-year smoking history. She has never used smokeless tobacco. She reports current alcohol use. She reports that she does not use drugs.  Family History  Problem Relation Age of Onset  . Congestive Heart Failure Mother   . Diabetes Mother 41  . Hypertension Mother   . Congestive Heart Failure Father   . Multiple myeloma Brother 70  . Diabetes Sister 73  . Hypertension Sister   . Hypertension Brother   . Hypertension Brother   .  Hyperlipidemia Brother   . Hypertension Brother   . Hyperlipidemia Brother     Medications: Patient's Medications  New Prescriptions   No medications on file  Previous Medications   AMLODIPINE (NORVASC) 10 MG TABLET    TAKE 1 TABLET DAILY   MULTIPLE VITAMINS-MINERALS (MULTIVITAMIN ADULTS 50+ PO)    Take by mouth daily.   MULTIPLE VITAMINS-MINERALS (ZINC PO)    Take 1 tablet by mouth daily.   ST JOHNS WORT 300 MG CAPS    Take 1 capsule by  mouth daily.   TRIAMTERENE-HYDROCHLOROTHIAZIDE (MAXZIDE-25) 37.5-25 MG TABLET    TAKE 1 TABLET DAILY  Modified Medications   No medications on file  Discontinued Medications   No medications on file    Physical Exam:  Vitals:   01/17/20 0943  BP: 130/88  Pulse: 89  Temp: (!) 97.3 F (36.3 C)  TempSrc: Temporal  SpO2: 97%  Weight: 248 lb 12.8 oz (112.9 kg)  Height: '5\' 7"'$  (1.702 m)   Body mass index is 38.97 kg/m. Wt Readings from Last 3 Encounters:  01/17/20 248 lb 12.8 oz (112.9 kg)  07/11/19 248 lb (112.5 kg)  02/13/19 258 lb 9.6 oz (117.3 kg)    Physical Exam Constitutional:      General: She is not in acute distress.    Appearance: She is well-developed. She is not diaphoretic.  HENT:     Head: Normocephalic and atraumatic.  Eyes:     Conjunctiva/sclera: Conjunctivae normal.     Pupils: Pupils are equal, round, and reactive to light.  Cardiovascular:     Rate and Rhythm: Normal rate and regular rhythm.     Heart sounds: Normal heart sounds.  Pulmonary:     Effort: Pulmonary effort is normal.     Breath sounds: Normal breath sounds.  Abdominal:     General: Bowel sounds are normal.     Palpations: Abdomen is soft.  Musculoskeletal:        General: No tenderness.     Cervical back: Normal range of motion and neck supple.  Skin:    General: Skin is warm and dry.  Neurological:     Mental Status: She is alert and oriented to person, place, and time.     Labs reviewed: Basic Metabolic Panel: Recent Labs    02/13/19 0758 07/06/19 0844 01/09/20 0809  NA  --  142 142  K  --  3.7 3.7  CL  --  104 104  CO2  --  30 30  GLUCOSE  --  99 91  BUN  --  13 13  CREATININE  --  0.69 0.71  CALCIUM  --  9.2 9.4  TSH 1.96  --   --    Liver Function Tests: Recent Labs    07/06/19 0844 01/09/20 0809  AST 19 20  ALT 18 20  BILITOT 0.5 0.6  PROT 7.2 7.2   No results for input(s): LIPASE, AMYLASE in the last 8760 hours. No results for input(s): AMMONIA in  the last 8760 hours. CBC: Recent Labs    01/09/20 0809  WBC 4.3  NEUTROABS 2,301  HGB 13.5  HCT 41.1  MCV 83.2  PLT 257   Lipid Panel: Recent Labs    07/06/19 0844 01/09/20 0809  CHOL 174 174  HDL 42* 43*  LDLCALC 118* 115*  TRIG 58 64  CHOLHDL 4.1 4.0   TSH: Recent Labs    02/13/19 0758  TSH 1.96   A1C: Lab Results  Component  Value Date   HGBA1C 5.8 (H) 01/09/2020     Assessment/Plan 1. Class 2 severe obesity due to excess calories with serious comorbidity and body mass index (BMI) of 38.0 to 38.9 in adult [facility]) Encouraged dietary and exercise modification. Pt declines referral to nutritionist or weight and wellness but will let us know if she changes her mind.   2. Essential hypertension Controlled on norvasc 10 mg daily. Continue dietary modification with medication management.  - CBC with Differential/Platelet; Future  3. Hyperglycemia Stable, continues in pre-diabetic range. Continue to work on diet modifications.   4. Mixed hyperlipidemia -LDL 115, goes <100, she would like work on dietary obfuscations vs starting medication at this time. Diet information provided.  - Lipid panel; Future - CMP with eGFR([facility]); Future  5. Need for hepatitis C screening test - Hepatitis C antibody; Future  6. Encounter for screening for HIV - HIV Antibody (routine testing w rflx); Future  Next appt: 6 months with labs before  Jermika Olden K. [place], [facility] [phone]

## 2020-04-12 ENCOUNTER — Other Ambulatory Visit: Payer: Self-pay | Admitting: Nurse Practitioner

## 2020-04-12 DIAGNOSIS — I10 Essential (primary) hypertension: Secondary | ICD-10-CM

## 2020-04-12 NOTE — Telephone Encounter (Addendum)
Patient has an appointment for labs and OV in August.  Only 90-day supply sent pending labs and findings on OV.

## 2020-07-13 ENCOUNTER — Other Ambulatory Visit: Payer: Self-pay | Admitting: Nurse Practitioner

## 2020-07-13 DIAGNOSIS — I10 Essential (primary) hypertension: Secondary | ICD-10-CM

## 2020-07-17 ENCOUNTER — Other Ambulatory Visit: Payer: BC Managed Care – PPO

## 2020-07-17 ENCOUNTER — Other Ambulatory Visit: Payer: Self-pay

## 2020-07-17 DIAGNOSIS — Z114 Encounter for screening for human immunodeficiency virus [HIV]: Secondary | ICD-10-CM

## 2020-07-17 DIAGNOSIS — I10 Essential (primary) hypertension: Secondary | ICD-10-CM

## 2020-07-17 DIAGNOSIS — Z1159 Encounter for screening for other viral diseases: Secondary | ICD-10-CM

## 2020-07-17 DIAGNOSIS — E782 Mixed hyperlipidemia: Secondary | ICD-10-CM

## 2020-07-18 LAB — HEPATITIS C ANTIBODY
Hepatitis C Ab: NONREACTIVE
SIGNAL TO CUT-OFF: 0.01 (ref <1.00)

## 2020-07-18 LAB — COMPLETE METABOLIC PANEL WITH GFR
AG Ratio: 1.4 (calc) (ref 1.0–2.5)
ALT: 16 U/L (ref 6–29)
AST: 18 U/L (ref 10–35)
Albumin: 4.3 g/dL (ref 3.6–5.1)
Alkaline phosphatase (APISO): 101 U/L (ref 37–153)
BUN: 13 mg/dL (ref 7–25)
CO2: 28 mmol/L (ref 20–32)
Calcium: 9.6 mg/dL (ref 8.6–10.4)
Chloride: 103 mmol/L (ref 98–110)
Creat: 0.7 mg/dL (ref 0.50–0.99)
GFR, Est African American: 106 mL/min/{1.73_m2} (ref >=60)
GFR, Est Non African American: 92 mL/min/{1.73_m2} (ref >=60)
Globulin: 3 g/dL (calc) (ref 1.9–3.7)
Glucose, Bld: 98 mg/dL (ref 65–99)
Potassium: 3.6 mmol/L (ref 3.5–5.3)
Sodium: 140 mmol/L (ref 135–146)
Total Bilirubin: 0.5 mg/dL (ref 0.2–1.2)
Total Protein: 7.3 g/dL (ref 6.1–8.1)

## 2020-07-18 LAB — CBC WITH DIFFERENTIAL/PLATELET
Absolute Monocytes: 296 cells/uL (ref 200–950)
Basophils Absolute: 31 cells/uL (ref 0–200)
Basophils Relative: 0.8 %
Eosinophils Absolute: 187 cells/uL (ref 15–500)
Eosinophils Relative: 4.8 %
HCT: 45 % (ref 35.0–45.0)
Hemoglobin: 14.3 g/dL (ref 11.7–15.5)
Lymphs Abs: 1240 cells/uL (ref 850–3900)
MCH: 26.9 pg — ABNORMAL LOW (ref 27.0–33.0)
MCHC: 31.8 g/dL — ABNORMAL LOW (ref 32.0–36.0)
MCV: 84.7 fL (ref 80.0–100.0)
MPV: 12.7 fL — ABNORMAL HIGH (ref 7.5–12.5)
Monocytes Relative: 7.6 %
Neutro Abs: 2145 cells/uL (ref 1500–7800)
Neutrophils Relative %: 55 %
Platelets: 274 10*3/uL (ref 140–400)
RBC: 5.31 10*6/uL — ABNORMAL HIGH (ref 3.80–5.10)
RDW: 14.5 % (ref 11.0–15.0)
Total Lymphocyte: 31.8 %
WBC: 3.9 10*3/uL (ref 3.8–10.8)

## 2020-07-18 LAB — LIPID PANEL
Cholesterol: 178 mg/dL (ref <200)
HDL: 44 mg/dL — ABNORMAL LOW (ref >=50)
LDL Cholesterol (Calc): 119 mg/dL (calc) — ABNORMAL HIGH
Non-HDL Cholesterol (Calc): 134 mg/dL (calc) — ABNORMAL HIGH (ref <130)
Total CHOL/HDL Ratio: 4 (calc) (ref <5.0)
Triglycerides: 63 mg/dL (ref <150)

## 2020-07-18 LAB — HIV ANTIBODY (ROUTINE TESTING W REFLEX): HIV 1&2 Ab, 4th Generation: NONREACTIVE

## 2020-07-19 ENCOUNTER — Other Ambulatory Visit: Payer: Self-pay

## 2020-07-19 ENCOUNTER — Ambulatory Visit (INDEPENDENT_AMBULATORY_CARE_PROVIDER_SITE_OTHER): Payer: BC Managed Care – PPO | Admitting: Nurse Practitioner

## 2020-07-19 ENCOUNTER — Encounter: Payer: Self-pay | Admitting: Nurse Practitioner

## 2020-07-19 VITALS — BP 136/84 | HR 84 | Temp 96.9°F | Ht 67.0 in | Wt 248.0 lb

## 2020-07-19 DIAGNOSIS — Z23 Encounter for immunization: Secondary | ICD-10-CM | POA: Diagnosis not present

## 2020-07-19 DIAGNOSIS — R739 Hyperglycemia, unspecified: Secondary | ICD-10-CM | POA: Diagnosis not present

## 2020-07-19 DIAGNOSIS — I10 Essential (primary) hypertension: Secondary | ICD-10-CM | POA: Diagnosis not present

## 2020-07-19 DIAGNOSIS — M1990 Unspecified osteoarthritis, unspecified site: Secondary | ICD-10-CM

## 2020-07-19 DIAGNOSIS — E782 Mixed hyperlipidemia: Secondary | ICD-10-CM | POA: Diagnosis not present

## 2020-07-19 DIAGNOSIS — Z6838 Body mass index (BMI) 38.0-38.9, adult: Secondary | ICD-10-CM

## 2020-07-19 NOTE — Patient Instructions (Signed)

## 2020-07-19 NOTE — Progress Notes (Signed)
Careteam: Patient Care Team: Lauree Chandler, NP as PCP - General (Geriatric Medicine)  PLACE OF SERVICE:  [place]  Advanced Directive information    Allergies  Allergen Reactions   Skin Adhesives [Cyanoacrylate] Itching    Chief Complaint  Patient presents with   Medical Management of Chronic Issues    6 month follow-up and discuss labs (copy printed)    Immunizations    Flu vaccine today if ok with provider with that decision. Discuss need for TD      HPI: Patient is a 64 y.o. female for routine follow up  OA- occasional pain to right knee.   Hyperlipidemia- diet controlled, LDL unchanged on recent labs at 119  Hypertension- on norvasc and triamterene-hctz  Obesity- started going to the gym but could not keep it up with the mask on.  Going to start looking into virtual classes to do at home.   Review of Systems:  Review of Systems  Constitutional: Negative for chills, fever and weight loss.  HENT: Negative for tinnitus.   Respiratory: Negative for cough, sputum production and shortness of breath.   Cardiovascular: Negative for chest pain, palpitations and leg swelling.  Gastrointestinal: Negative for abdominal pain, constipation, diarrhea and heartburn.  Genitourinary: Negative for dysuria, frequency and urgency.  Musculoskeletal: Negative for back pain, falls, joint pain and myalgias.  Skin: Negative.   Neurological: Negative for dizziness and headaches.  Psychiatric/Behavioral: Negative for depression and memory loss. The patient does not have insomnia.     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[place])     Superficial fungus infection of skin    on chest   Thyroid cancer ([place]) 2016   Thyroid mass    "2"   Past Surgical History:  Procedure Laterality Date   CESAREAN SECTION  1997   IRRIGATION AND DEBRIDEMENT SEBACEOUS CYST  ~ 2010   on back   PARATHYROIDECTOMY Right 05/27/2015   PARATHYROIDECTOMY Right 05/27/2015   Procedure: RIGHT PARATHYROIDECTOMY WITH FROZEN SECTION;  Surgeon: Izora Gala, MD;  Location: [place];  Service: ENT;  Laterality: Right;   THYROID LOBECTOMY Right 05/27/2015   THYROIDECTOMY Right 05/27/2015   Procedure: RIGHT THYROID LOBECTOMY WITH FROZEN SECTION;  Surgeon: Izora Gala, MD;  Location: [place];  Service: ENT;  Laterality: Right;   [place]   Social History:   reports that she has quit smoking. Her smoking use included cigarettes. She has a 1.00 pack-year smoking history. She has never used smokeless tobacco. She reports current alcohol use. She reports that she does not use drugs.  Family History  Problem Relation Age of Onset   Congestive Heart Failure Mother    Diabetes Mother 19   Hypertension Mother    Congestive Heart Failure Father    Multiple myeloma Brother 83   Diabetes Sister 31   Hypertension Sister    Hypertension Brother    Hypertension Brother    Hyperlipidemia Brother    Hypertension Brother    Hyperlipidemia Brother     Medications: Patient's Medications  New Prescriptions   No medications on file  Previous Medications   AMLODIPINE (NORVASC) 10 MG TABLET    TAKE 1 TABLET DAILY   MULTIPLE VITAMINS-MINERALS (MULTIVITAMIN ADULTS 50+ PO)    Take by mouth daily.   ST JOHNS WORT 300 MG CAPS    Take 1 capsule by mouth daily.   TRIAMTERENE-HYDROCHLOROTHIAZIDE (MAXZIDE-25) 37.5-25 MG TABLET    TAKE 1 TABLET DAILY   TURMERIC (QC TUMERIC COMPLEX PO)    Take 1 tablet by mouth daily.  Modified Medications   No medications on file  Discontinued Medications   MULTIPLE VITAMINS-MINERALS (ZINC PO)    Take 1  tablet by mouth daily.    Physical Exam:  Vitals:   07/19/20 0842  BP: 136/84  Pulse: 84  Temp: (!) 96.9 F (36.1 C)  TempSrc: Temporal  SpO2: 98%  Weight: 248 lb (112.5 kg)  Height: $Remove'5\' 7"'VnAUZuw$  (1.702 m)   Body mass index is 38.84 kg/m. Wt Readings from Last 3 Encounters:  07/19/20 248 lb (112.5 kg)  01/17/20 248 lb 12.8 oz (112.9 kg)  07/11/19 248 lb (112.5 kg)    Physical Exam Constitutional:      General: She is not in acute distress.    Appearance: She is well-developed. She is not diaphoretic.  HENT:     Head: Normocephalic and atraumatic.     Mouth/Throat:     Pharynx: No oropharyngeal exudate.  Eyes:     Conjunctiva/sclera: Conjunctivae normal.     Pupils: Pupils are equal, round, and reactive to light.  Cardiovascular:     Rate and Rhythm: Normal rate and regular rhythm.     Heart sounds: Normal heart sounds.  Pulmonary:     Effort: Pulmonary effort is normal.     Breath sounds: Normal breath sounds.  Abdominal:     General: Bowel sounds are normal.     Palpations: Abdomen is soft.  Musculoskeletal:        General: No tenderness.     Cervical back: Normal range of motion and neck supple.  Skin:    General: Skin is warm and dry.  Neurological:     Mental Status: She is alert and oriented to person, place, and time.     Labs reviewed: Basic Metabolic Panel: Recent Labs    01/09/20 0809 07/17/20 0819  NA 142 140  K 3.7 3.6  CL 104 103  CO2 30 28  GLUCOSE 91 98  BUN 13 13  CREATININE 0.71 0.70  CALCIUM 9.4 9.6   Liver Function Tests: Recent Labs    01/09/20 0809 07/17/20 0819  AST 20 18  ALT 20 16  BILITOT 0.6 0.5  PROT 7.2 7.3   No results for input(s): LIPASE, AMYLASE in the last 8760 hours. No results for input(s): AMMONIA in the last 8760 hours. CBC: Recent Labs    01/09/20 0809 07/17/20 0819  WBC 4.3 3.9  NEUTROABS 2,301 2,145  HGB 13.5 14.3  HCT 41.1 45.0  MCV 83.2 84.7  PLT 257 274   Lipid Panel: Recent Labs     01/09/20 0809 07/17/20 0819  CHOL 174 178  HDL 43* 44*  LDLCALC 115* 119*  TRIG 64 63  CHOLHDL 4.0 4.0   TSH: No results for input(s): TSH in the last 8760 hours. A1C: Lab Results  Component Value Date   HGBA1C 5.8 (H) 01/09/2020     Assessment/Plan 1. Essential hypertension Stable on current regimen, discussed importance of lifestyle modification including low sodium diet.  - CBC with Differential/Platelet; Future - COMPLETE METABOLIC PANEL  WITH GFR; Future  2. Mixed hyperlipidemia -plans to work on dietary changes and increase physical activity. LDL without improvement in the last 6 months. - Lipid Panel; Future - COMPLETE METABOLIC PANEL WITH GFR; Future  3. Class 2 severe obesity due to excess calories with serious comorbidity and body mass index (BMI) of 38.0 to 38.9 in adult [facility]) -discussed today, encouraged weight loss to promote health through dietary modifications and exercise.   4. Hyperglycemia -dietary modifications encouraged. - Hemoglobin A1c; Future  5. Arthritis -stable at this time, encouraged strengthening muscle to improve OA symptoms.   6. Need for influenza vaccination - Flu Vaccine QUAD 6+ mos PF IM (Fluarix Quad PF)  Next appt: 6 months, labs prior  Avriel Kandel K. [place], [facility] [phone]

## 2020-07-26 ENCOUNTER — Other Ambulatory Visit: Payer: Self-pay | Admitting: Nurse Practitioner

## 2020-11-17 IMAGING — DX DG KNEE COMPLETE 4+V*R*
4 series · 4 of 4 positions shown · non-contrast
Comparison: None.

CLINICAL DATA: Chronic right knee pain.

EXAM:
RIGHT KNEE - COMPLETE 4+ VIEW

[dg knee complete 4 views right (1 of 4)]
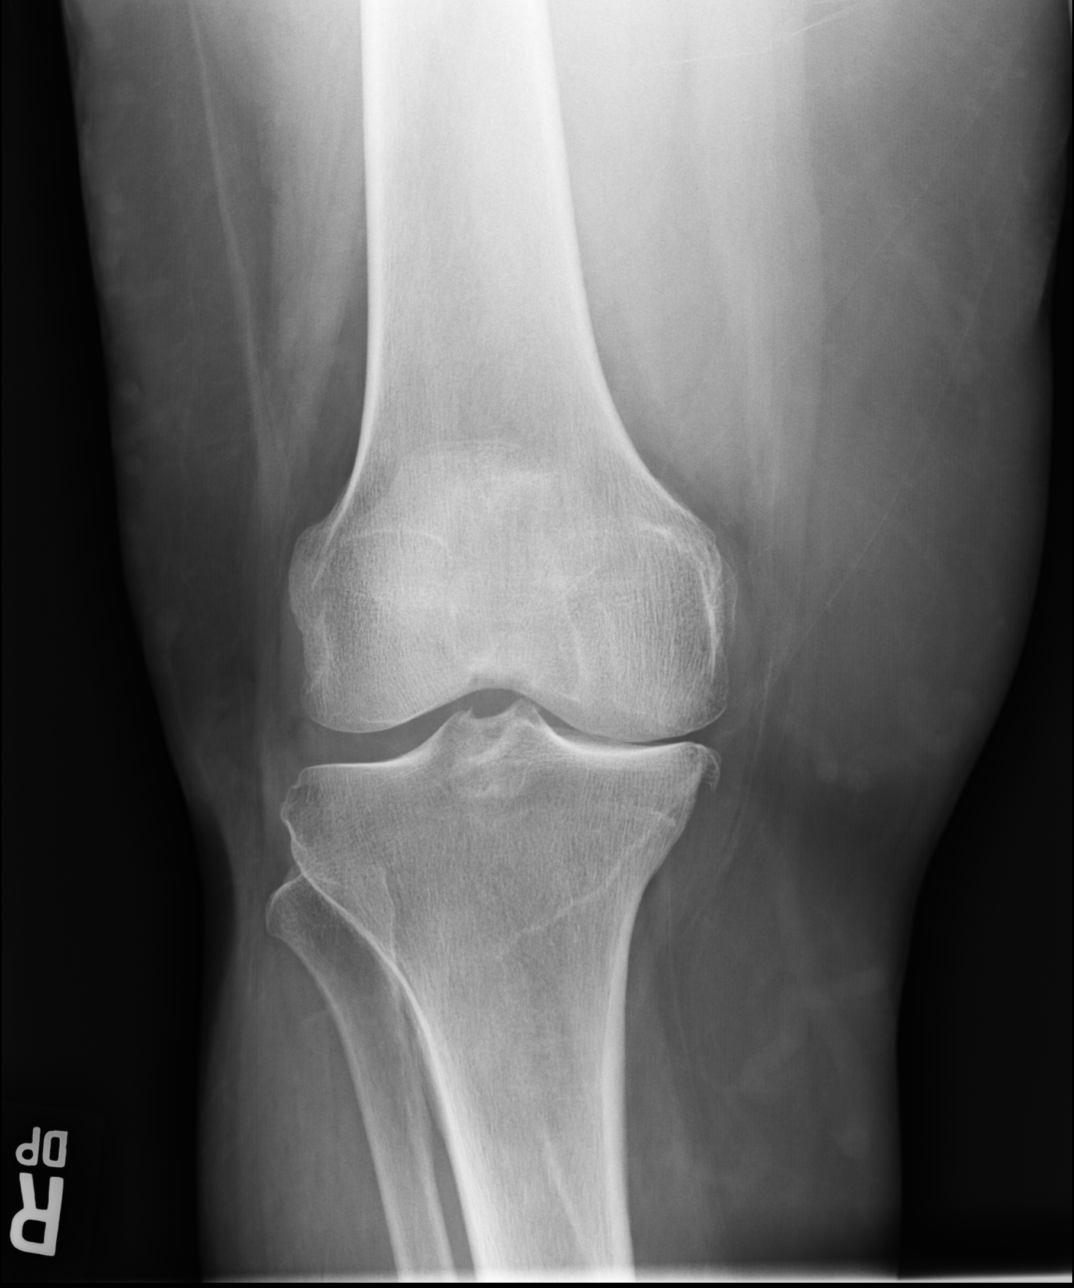

[dg knee complete 4 views right (2 of 4)]
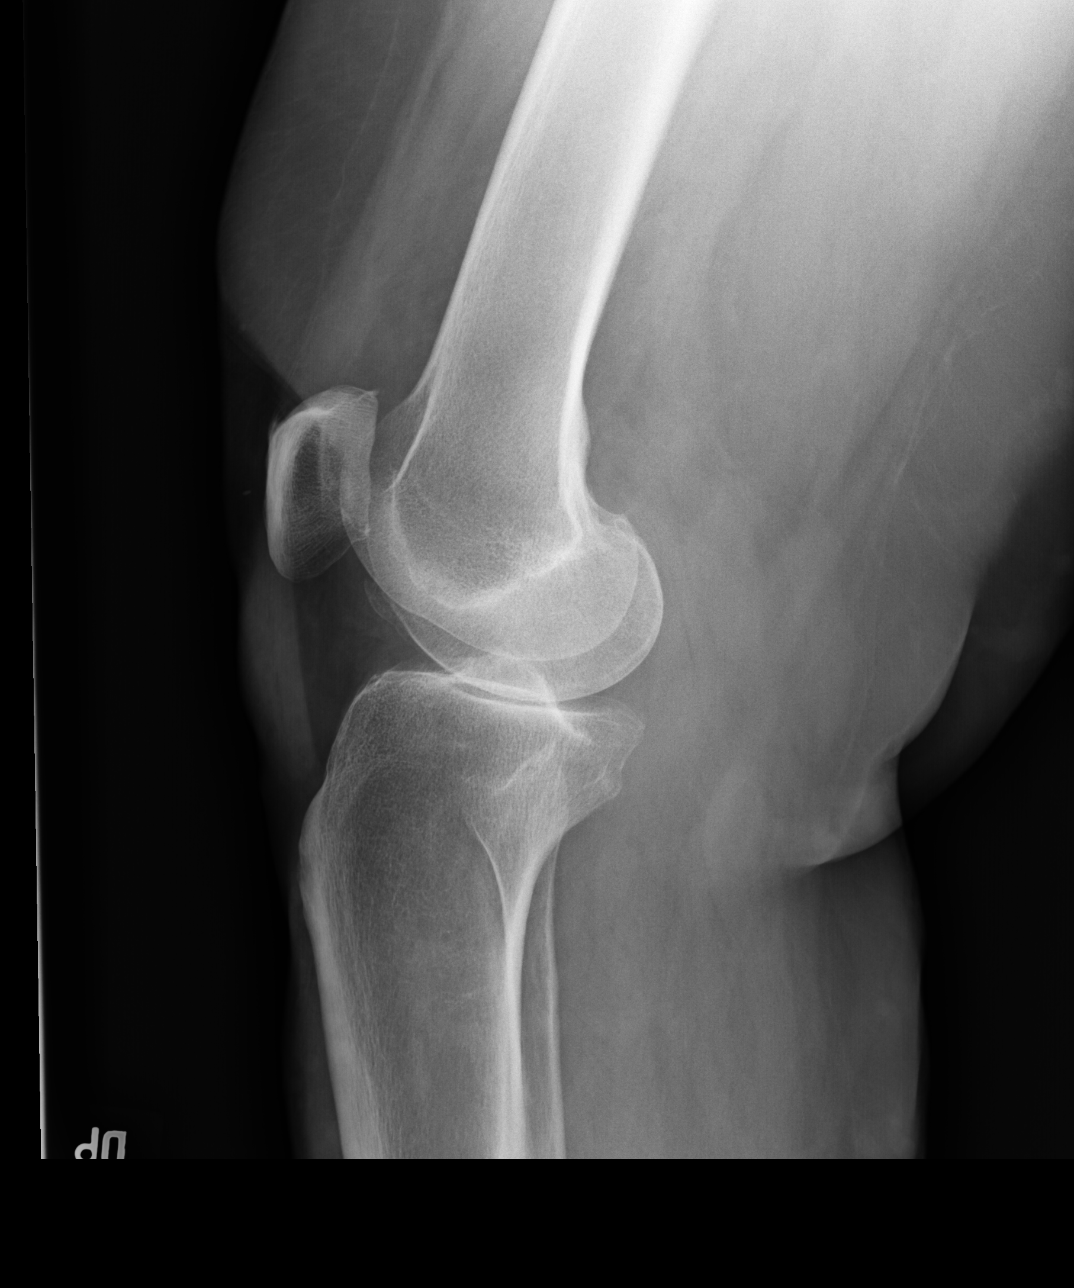

[dg knee complete 4 views right (3 of 4)]
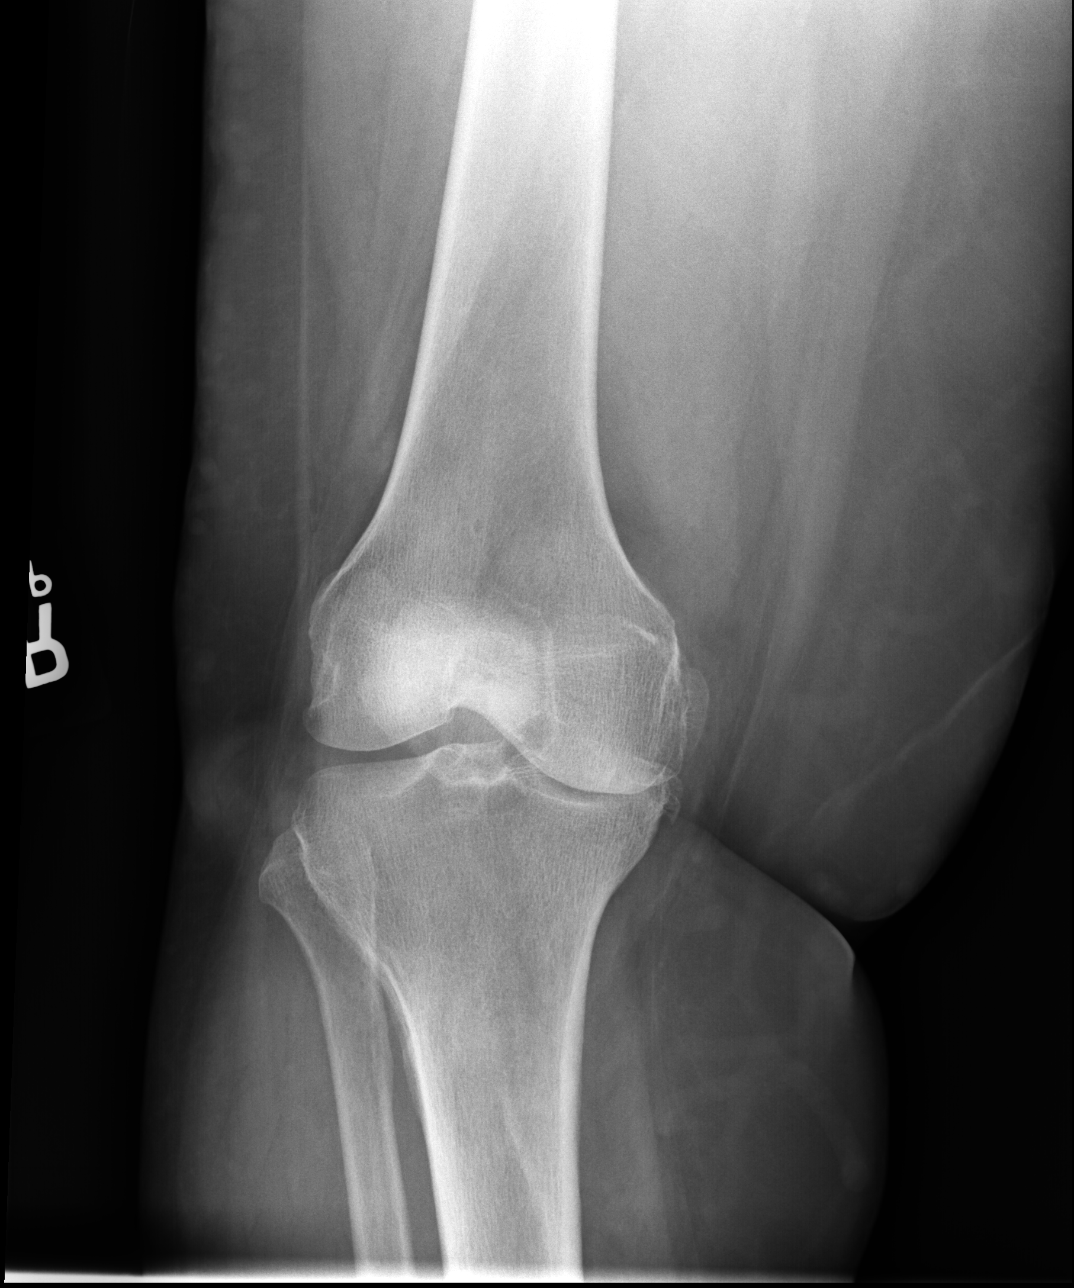

[dg knee complete 4 views right (4 of 4)]
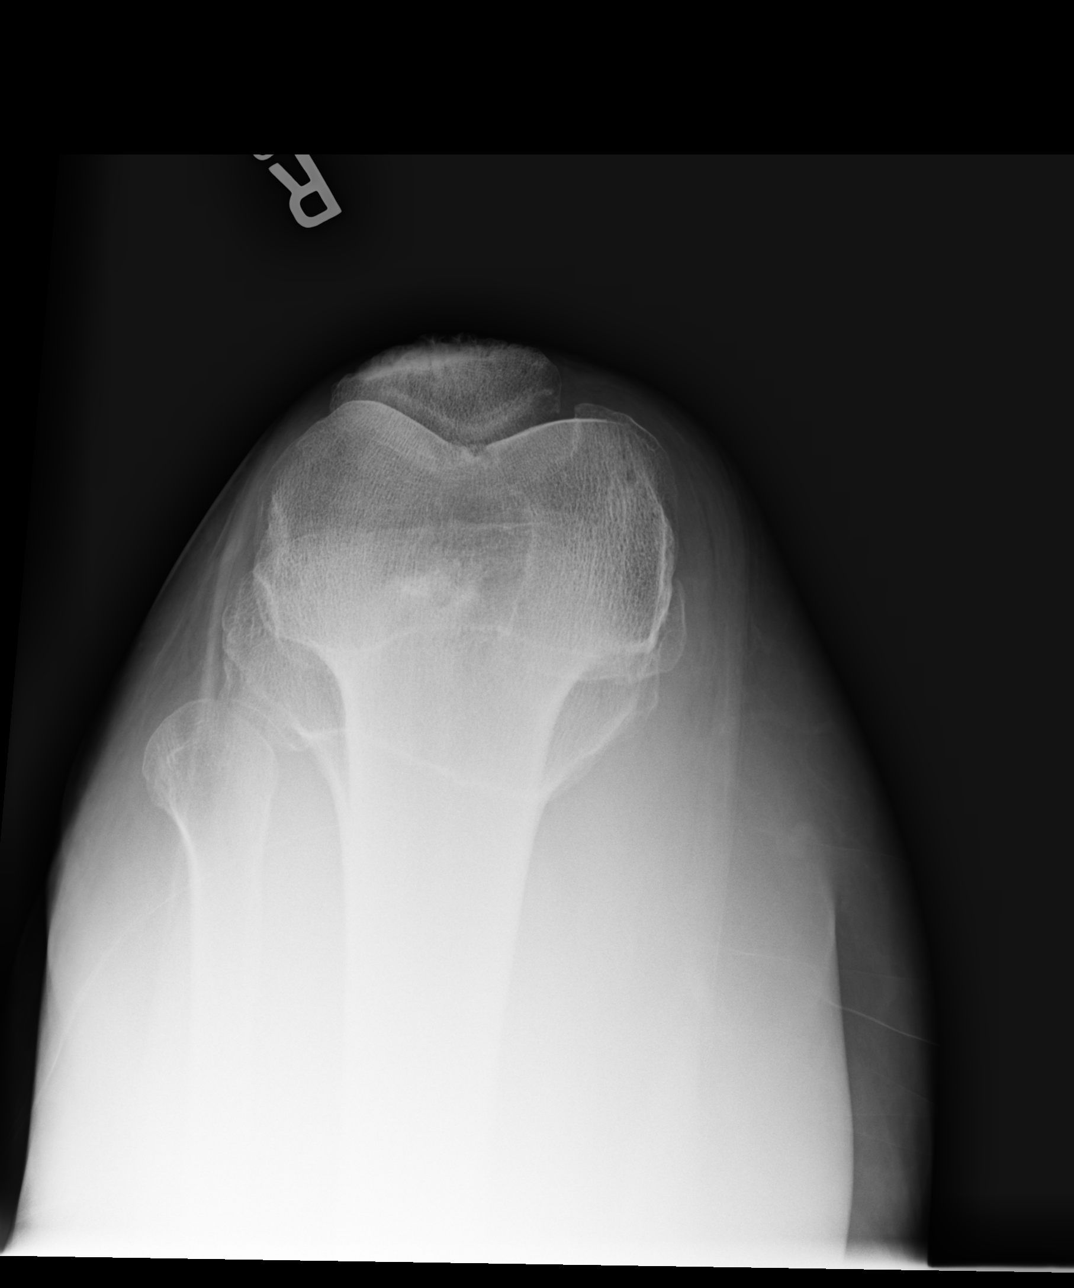

[4 of 4 positions shown; findings below may reference images not displayed]

FINDINGS: No fracture, bone lesion or dislocation.

Moderate to severe narrowing of the medial joint space compartment
narrowing there are marginal osteophytes from the medial compartment
minimally from the patella. No other arthropathic changes.

No joint effusion.

Surrounding soft tissues are unremarkable.
IMPRESSION: 1. No fracture, bone lesion or acute finding.
2. Moderate to advanced osteoarthritis primarily involving the
medial compartment.

## 2021-01-07 ENCOUNTER — Other Ambulatory Visit: Payer: Self-pay | Admitting: Nurse Practitioner

## 2021-01-07 DIAGNOSIS — I10 Essential (primary) hypertension: Secondary | ICD-10-CM

## 2021-01-16 ENCOUNTER — Other Ambulatory Visit: Payer: Self-pay | Admitting: Nurse Practitioner

## 2021-01-20 ENCOUNTER — Other Ambulatory Visit: Payer: Medicare Other

## 2021-01-20 ENCOUNTER — Other Ambulatory Visit: Payer: Self-pay

## 2021-01-20 DIAGNOSIS — E782 Mixed hyperlipidemia: Secondary | ICD-10-CM

## 2021-01-20 DIAGNOSIS — I10 Essential (primary) hypertension: Secondary | ICD-10-CM

## 2021-01-20 DIAGNOSIS — R739 Hyperglycemia, unspecified: Secondary | ICD-10-CM

## 2021-01-21 LAB — COMPLETE METABOLIC PANEL WITH GFR
AG Ratio: 1.4 (calc) (ref 1.0–2.5)
ALT: 18 U/L (ref 6–29)
AST: 16 U/L (ref 10–35)
Albumin: 4.3 g/dL (ref 3.6–5.1)
Alkaline phosphatase (APISO): 96 U/L (ref 37–153)
BUN: 10 mg/dL (ref 7–25)
CO2: 32 mmol/L (ref 20–32)
Calcium: 9.6 mg/dL (ref 8.6–10.4)
Chloride: 104 mmol/L (ref 98–110)
Creat: 0.73 mg/dL (ref 0.50–0.99)
GFR, Est African American: 100 mL/min/{1.73_m2} (ref >=60)
GFR, Est Non African American: 86 mL/min/{1.73_m2} (ref >=60)
Globulin: 3 g/dL (calc) (ref 1.9–3.7)
Glucose, Bld: 102 mg/dL — ABNORMAL HIGH (ref 65–99)
Potassium: 3.9 mmol/L (ref 3.5–5.3)
Sodium: 142 mmol/L (ref 135–146)
Total Bilirubin: 0.6 mg/dL (ref 0.2–1.2)
Total Protein: 7.3 g/dL (ref 6.1–8.1)

## 2021-01-21 LAB — LIPID PANEL
Cholesterol: 165 mg/dL (ref <200)
HDL: 40 mg/dL — ABNORMAL LOW (ref >=50)
LDL Cholesterol (Calc): 110 mg/dL (calc) — ABNORMAL HIGH
Non-HDL Cholesterol (Calc): 125 mg/dL (calc) (ref <130)
Total CHOL/HDL Ratio: 4.1 (calc) (ref <5.0)
Triglycerides: 66 mg/dL (ref <150)

## 2021-01-21 LAB — CBC WITH DIFFERENTIAL/PLATELET
Absolute Monocytes: 319 cells/uL (ref 200–950)
Basophils Absolute: 30 cells/uL (ref 0–200)
Basophils Relative: 0.8 %
Eosinophils Absolute: 209 cells/uL (ref 15–500)
Eosinophils Relative: 5.5 %
HCT: 41.8 % (ref 35.0–45.0)
Hemoglobin: 13.6 g/dL (ref 11.7–15.5)
Lymphs Abs: 1360 cells/uL (ref 850–3900)
MCH: 27.2 pg (ref 27.0–33.0)
MCHC: 32.5 g/dL (ref 32.0–36.0)
MCV: 83.6 fL (ref 80.0–100.0)
MPV: 12.8 fL — ABNORMAL HIGH (ref 7.5–12.5)
Monocytes Relative: 8.4 %
Neutro Abs: 1881 cells/uL (ref 1500–7800)
Neutrophils Relative %: 49.5 %
Platelets: 255 10*3/uL (ref 140–400)
RBC: 5 10*6/uL (ref 3.80–5.10)
RDW: 14.3 % (ref 11.0–15.0)
Total Lymphocyte: 35.8 %
WBC: 3.8 10*3/uL (ref 3.8–10.8)

## 2021-01-21 LAB — HEMOGLOBIN A1C
Hgb A1c MFr Bld: 5.8 % of total Hgb — ABNORMAL HIGH (ref <5.7)
Mean Plasma Glucose: 120 mg/dL
eAG (mmol/L): 6.6 mmol/L

## 2021-01-24 ENCOUNTER — Encounter: Payer: Self-pay | Admitting: Nurse Practitioner

## 2021-01-24 ENCOUNTER — Other Ambulatory Visit: Payer: Self-pay

## 2021-01-24 ENCOUNTER — Ambulatory Visit (INDEPENDENT_AMBULATORY_CARE_PROVIDER_SITE_OTHER): Payer: BC Managed Care – PPO | Admitting: Nurse Practitioner

## 2021-01-24 VITALS — BP 150/100 | HR 87 | Temp 97.8°F | Ht 67.0 in | Wt 248.4 lb

## 2021-01-24 DIAGNOSIS — E782 Mixed hyperlipidemia: Secondary | ICD-10-CM

## 2021-01-24 DIAGNOSIS — I10 Essential (primary) hypertension: Secondary | ICD-10-CM | POA: Diagnosis not present

## 2021-01-24 DIAGNOSIS — R739 Hyperglycemia, unspecified: Secondary | ICD-10-CM | POA: Diagnosis not present

## 2021-01-24 DIAGNOSIS — Z23 Encounter for immunization: Secondary | ICD-10-CM | POA: Diagnosis not present

## 2021-01-24 DIAGNOSIS — M1990 Unspecified osteoarthritis, unspecified site: Secondary | ICD-10-CM

## 2021-01-24 DIAGNOSIS — Z6838 Body mass index (BMI) 38.0-38.9, adult: Secondary | ICD-10-CM

## 2021-01-24 DIAGNOSIS — E2839 Other primary ovarian failure: Secondary | ICD-10-CM

## 2021-01-24 NOTE — Patient Instructions (Addendum)
To change your body (health) you have to change your mind :)  Follow up for FIRST (initial- in person) AWV in 2 months.  6 month for routine follow up   To get TDAP at local pharmacy  To call [phone] to schedule mammogram and bone density

## 2021-01-24 NOTE — Progress Notes (Signed)
Careteam: Patient Care Team: Lauree Chandler, NP as PCP - General (Geriatric Medicine)  PLACE OF SERVICE:  [place] Directive information Does Patient Have a Medical Advance Directive?: No, Would patient like information on creating a medical advance directive?: Yes (MAU/Ambulatory/Procedural Areas - Information given)  Allergies  Allergen Reactions  . Skin Adhesives [Cyanoacrylate] Itching    Chief Complaint  Patient presents with  . Medical Management of Chronic Issues    6 Month follow up. Discuss the need for Tetanus/Tdap, Pneumonia vaccine, Pap smear and dexa scan.     HPI: Patient is a 65 y.o. female here for routine follow-up.  Osteoarthritis- R knee pain occasionally, seems to be associated with the weather. Takes ibuprofen for symptom relief  Hypertension- BP today 146/96 after recheck. BP at home around 120-130/80's. On norvasc and triamterene-hctz  Hyperlipidemia- LDL today is 110, has been eating less fast food and more at home. Will start eating more fruits and vegetables.   Obesity-Now that her gym has lifted the mask mandate she is going to start going back to the gym with her son, starting this Saturday.  Cervical screening- Going to make an appointment with [facility]   Review of Systems:  Review of Systems  Constitutional: Negative for chills, fever and weight loss.  HENT: Negative for tinnitus.   Respiratory: Negative for cough, sputum production and shortness of breath.   Cardiovascular: Negative for chest pain, palpitations and leg swelling.  Gastrointestinal: Negative for abdominal pain, constipation, nausea and vomiting.  Genitourinary: Negative for dysuria, frequency and urgency.  Musculoskeletal: Positive for joint pain. Negative for back pain, falls and myalgias.  Skin: Negative.   Neurological: Negative for dizziness, weakness and headaches.  Psychiatric/Behavioral: Negative for depression and memory loss.  The patient does not have insomnia.     Past Medical History:  Diagnosis Date  . Arthritis    "right knee" (05/27/2015)  . Family history of adverse reaction to anesthesia    "it's hard to bring my father back; more than once too" (05/27/2015)  . GERD (gastroesophageal reflux disease)   . Hypercalcemia    parathyroid gland removed  . Hypertension    controlled with medication  . Obesity (BMI 30-39.9)   . Parathyroid cyst ([place])   . Superficial fungus infection of skin    on chest  . Thyroid cancer ([place]) 2016  . Thyroid mass    "2"   Past Surgical History:  Procedure Laterality Date  . CESAREAN SECTION  1997  . IRRIGATION AND DEBRIDEMENT SEBACEOUS CYST  ~ 2010   on back  . PARATHYROIDECTOMY Right 05/27/2015  . PARATHYROIDECTOMY Right 05/27/2015   Procedure: RIGHT PARATHYROIDECTOMY WITH FROZEN SECTION;  Surgeon: Izora Gala, MD;  Location: [place];  Service: ENT;  Laterality: Right;  . THYROID LOBECTOMY Right 05/27/2015  . THYROIDECTOMY Right 05/27/2015   Procedure: RIGHT THYROID LOBECTOMY WITH FROZEN SECTION;  Surgeon: Izora Gala, MD;  Location: [place];  Service: ENT;  Laterality: Right;  . TUBAL LIGATION  1997   Social History:   reports that she has quit smoking. Her smoking use included cigarettes. She has a 1.00 pack-year smoking history. She has never used smokeless tobacco. She reports current alcohol use. She reports that she does not use drugs.  Family History  Problem Relation Age of Onset  . Congestive Heart Failure Mother   . Diabetes Mother 79  . Hypertension Mother   . Congestive Heart Failure Father   .  Multiple myeloma Brother 60  . Diabetes Sister 73  . Hypertension Sister   . Hypertension Brother   . Hypertension Brother   . Hyperlipidemia Brother   . Hypertension Brother   . Hyperlipidemia Brother     Medications: Patient's Medications  New Prescriptions   No medications on file  Previous Medications   AMLODIPINE (NORVASC) 10 MG TABLET    TAKE 1  TABLET DAILY   MULTIPLE VITAMINS-MINERALS (MULTIVITAMIN ADULTS 50+ PO)    Take by mouth daily.   ST JOHNS WORT 300 MG CAPS    Take 1 capsule by mouth daily.   TRIAMTERENE-HYDROCHLOROTHIAZIDE (MAXZIDE-25) 37.5-25 MG TABLET    TAKE 1 TABLET DAILY  Modified Medications   No medications on file  Discontinued Medications   TURMERIC (QC TUMERIC COMPLEX PO)    Take 1 tablet by mouth daily.    Physical Exam:  Vitals:   01/24/21 0848  BP: (!) 150/100  Pulse: 87  Temp: 97.8 F (36.6 C)  SpO2: 98%  Weight: 248 lb 6.4 oz (112.7 kg)  Height: _0  (1.702 m)   Body mass index is 38.9 kg/m. Wt Readings from Last 3 Encounters:  01/24/21 248 lb 6.4 oz (112.7 kg)  07/19/20 248 lb (112.5 kg)  01/17/20 248 lb 12.8 oz (112.9 kg)    Physical Exam Constitutional:      General: She is not in acute distress.    Appearance: Normal appearance. She is not diaphoretic.  HENT:     Head: Normocephalic and atraumatic.     Mouth/Throat:     Pharynx: No oropharyngeal exudate.  Eyes:     Conjunctiva/sclera: Conjunctivae normal.     Pupils: Pupils are equal, round, and reactive to light.  Cardiovascular:     Rate and Rhythm: Normal rate and regular rhythm.     Heart sounds: Normal heart sounds.  Pulmonary:     Effort: Pulmonary effort is normal.     Breath sounds: Normal breath sounds.  Abdominal:     General: Bowel sounds are normal.     Palpations: Abdomen is soft.  Musculoskeletal:        General: No swelling or tenderness. Normal range of motion.  Skin:    General: Skin is warm and dry.  Neurological:     Mental Status: She is alert and oriented to person, place, and time.  Psychiatric:        Mood and Affect: Mood normal.     Labs reviewed: Basic Metabolic Panel: Recent Labs    07/17/20 0819 01/20/21 0810  NA 140 142  K 3.6 3.9  CL 103 104  CO2 28 32  GLUCOSE 98 102*  BUN 13 10  CREATININE 0.70 0.73  CALCIUM 9.6 9.6   Liver Function Tests: Recent Labs    07/17/20 0819  01/20/21 0810  AST 18 16  ALT 16 18  BILITOT 0.5 0.6  PROT 7.3 7.3   No results for input(s): LIPASE, AMYLASE in the last 8760 hours. No results for input(s): AMMONIA in the last 8760 hours. CBC: Recent Labs    07/17/20 0819 01/20/21 0810  WBC 3.9 3.8  NEUTROABS 2,145 1,881  HGB 14.3 13.6  HCT 45.0 41.8  MCV 84.7 83.6  PLT 274 255   Lipid Panel: Recent Labs    07/17/20 0819 01/20/21 0810  CHOL 178 165  HDL 44* 40*  LDLCALC 119* 110*  TRIG 63 66  CHOLHDL 4.0 4.1   TSH: No results for input(s): TSH in the  last 8760 hours. A1C: Lab Results  Component Value Date   HGBA1C 5.8 (H) 01/20/2021     Assessment/Plan 1. Essential hypertension Stable today on current regimen. bp improved to 146/96. Discussed importance of maintaining a healthy diet low in sodium. Will increase her physical activity. Continue current medication.   2. Mixed hyperlipidemia LDL remains unchanged since last visit. Plans to work on her diet modifications, will decrease fats/sugars in her diet. Will start drinking less sodas/sweet tea.  3. Class 2 severe obesity due to excess calories with serious comorbidity and body mass index (BMI) of 38.0 to 38.9 in adult [facility]) Discussed current diet and recommendations moving further. Will add more fruits and vegetables into diet. Will decreases sodas, fruit snacks, etc. Plans to start back exercising at the gym.  4. Hyperglycemia Diet modifications encouraged.  5. Arthritis Stable at this time, discussed strength training exercising for symptom relief  6. Estrogen deficiency Discussed benefits of bone density scan, will order today - DG Bone Density; Future  7. Need for vaccination against Streptococcus pneumoniae - Pneumococcal conjugate vaccine 13-valent  Discussed tdap at local pharmacy   Next appt: 2 months for in person AWV (initial visit), 6 months for routine follow up.  Carlos American. [place], [facility] [phone]   .

## 2021-01-31 ENCOUNTER — Other Ambulatory Visit: Payer: Self-pay | Admitting: Nurse Practitioner

## 2021-01-31 DIAGNOSIS — Z1231 Encounter for screening mammogram for malignant neoplasm of breast: Secondary | ICD-10-CM

## 2021-03-07 ENCOUNTER — Ambulatory Visit: Payer: Medicare Other | Admitting: Family

## 2021-03-07 ENCOUNTER — Other Ambulatory Visit: Payer: Self-pay

## 2021-03-07 ENCOUNTER — Ambulatory Visit (INDEPENDENT_AMBULATORY_CARE_PROVIDER_SITE_OTHER): Payer: BC Managed Care – PPO | Admitting: Nurse Practitioner

## 2021-03-07 ENCOUNTER — Encounter: Payer: Self-pay | Admitting: Nurse Practitioner

## 2021-03-07 VITALS — BP 138/94 | HR 87 | Temp 96.2°F | Ht 67.0 in | Wt 252.0 lb

## 2021-03-07 DIAGNOSIS — B029 Zoster without complications: Secondary | ICD-10-CM

## 2021-03-07 MED ORDER — VALACYCLOVIR HCL 500 MG PO TABS: 1000.0000 mg | ORAL_TABLET | Freq: Three times a day (TID) | ORAL | 0 refills | Status: DC

## 2021-03-07 NOTE — Patient Instructions (Signed)
Take medication with food   Shingles  Shingles, which is also known as herpes zoster, is an infection that causes a painful skin rash and fluid-filled blisters. It is caused by a virus. Shingles only develops in people who:  Have had chickenpox.  Have been given a medicine to protect against chickenpox (have been vaccinated). Shingles is rare in this group. What are the causes? Shingles is caused by varicella-zoster virus (VZV). This is the same virus that causes chickenpox. After a person is exposed to VZV, the virus stays in the body in an inactive (dormant) state. Shingles develops if the virus is reactivated. This can happen many years after the first (initial) exposure to VZV. It is not known what causes this virus to be reactivated. What increases the risk? People who have had chickenpox or received the chickenpox vaccine are at risk for shingles. Shingles infection is more common in people who:  Are older than age 72.  Have a weakened disease-fighting system (immune system), such as people with: ? HIV. ? AIDS. ? Cancer.  Are taking medicines that weaken the immune system, such as transplant medicines.  Are experiencing a lot of stress. What are the signs or symptoms? Early symptoms of this condition include itching, tingling, and pain in an area on your skin. Pain may be described as burning, stabbing, or throbbing. A few days or weeks after early symptoms start, a painful red rash appears. The rash is usually on one side of the body and has a band-like or belt-like pattern. The rash eventually turns into fluid-filled blisters that break open, change into scabs, and dry up in about 2-3 weeks. At any time during the infection, you may also develop:  A fever.  Chills.  A headache.  An upset stomach. How is this diagnosed? This condition is diagnosed with a skin exam. Skin or fluid samples may be taken from the blisters before a diagnosis is made. These samples are examined  under a microscope or sent to a lab for testing. How is this treated? The rash may last for several weeks. There is not a specific cure for this condition. Your health care provider will probably prescribe medicines to help you manage pain, recover more quickly, and avoid long-term problems. Medicines may include:  Antiviral drugs.  Anti-inflammatory drugs.  Pain medicines.  Anti-itching medicines (antihistamines). If the area involved is on your face, you may be referred to a specialist, such as an eye doctor (ophthalmologist) or an ear, nose, and throat (ENT) doctor (otolaryngologist) to help you avoid eye problems, chronic pain, or disability. Follow these instructions at home: Medicines  Take over-the-counter and prescription medicines only as told by your health care provider.  Apply an anti-itch cream or numbing cream to the affected area as told by your health care provider. Relieving itching and discomfort  Apply cold, wet cloths (cold compresses) to the area of the rash or blisters as told by your health care provider.  Cool baths can be soothing. Try adding baking soda or dry oatmeal to the water to reduce itching. Do not bathe in hot water.   Blister and rash care  Keep your rash covered with a loose bandage (dressing). Wear loose-fitting clothing to help ease the pain of material rubbing against the rash.  Keep your rash and blisters clean by washing the area with mild soap and cool water as told by your health care provider.  Check your rash every day for signs of infection. Check for: ?  More redness, swelling, or pain. ? Fluid or blood. ? Warmth. ? Pus or a bad smell.  Do not scratch your rash or pick at your blisters. To help avoid scratching: ? Keep your fingernails clean and cut short. ? Wear gloves or mittens while you sleep, if scratching is a problem. General instructions  Rest as told by your health care provider.  Keep all follow-up visits as told by  your health care provider. This is important.  Wash your hands often with soap and water. If soap and water are not available, use hand sanitizer. Doing this lowers your chance of getting a bacterial skin infection.  Before your blisters change into scabs, your shingles infection can cause chickenpox in people who have never had it or have never been vaccinated against it. To prevent this from happening, avoid contact with other people, especially: ? Babies. ? Pregnant women. ? Children who have eczema. ? Elderly people who have transplants. ? People who have chronic illnesses, such as cancer or AIDS. Contact a health care provider if:  Your pain is not relieved with prescribed medicines.  Your pain does not get better after the rash heals.  You have signs of infection in the rash area, such as: ? More redness, swelling, or pain around the rash. ? Fluid or blood coming from the rash. ? The rash area feeling warm to the touch. ? Pus or a bad smell coming from the rash. Get help right away if:  The rash is on your face or nose.  You have facial pain, pain around your eye area, or loss of feeling on one side of your face.  You have difficulty seeing.  You have ear pain or have ringing in your ear.  You have a loss of taste.  Your condition gets worse. Summary  Shingles, which is also known as herpes zoster, is an infection that causes a painful skin rash and fluid-filled blisters.  This condition is diagnosed with a skin exam. Skin or fluid samples may be taken from the blisters and examined before the diagnosis is made.  Keep your rash covered with a loose bandage (dressing). Wear loose-fitting clothing to help ease the pain of material rubbing against the rash.  Before your blisters change into scabs, your shingles infection can cause chickenpox in people who have never had it or have never been vaccinated against it. This information is not intended to replace advice given  to you by your health care provider. Make sure you discuss any questions you have with your health care provider. Document Revised: 03/10/2019 Document Reviewed: 07/21/2017 Elsevier Patient Education  2021 [employer].

## 2021-03-07 NOTE — Progress Notes (Signed)
Careteam: Patient Care Team: Lauree Chandler, NP as PCP - General (Geriatric Medicine)  PLACE OF SERVICE:  [place]  Advanced Directive information    Allergies  Allergen Reactions  . Skin Adhesives [Cyanoacrylate] Itching    Chief Complaint  Patient presents with  . Acute Visit    Rash on the back of neck itching and burning. Patient denies changing daily habits.      HPI: Patient is a 65 y.o. female due to rash with intense burning and itching. She has had hx of shingles and this feels similar.  Painful area started on her head then went to her neck and 2 more areas popped up today while at work Had to remove her work badge which sits around neck due to pain of touching area.  Has not had shingles vaccine   Review of Systems:  Review of Systems  Constitutional: Negative for chills, fever and malaise/fatigue.  Skin: Positive for itching and rash.  Neurological: Negative for dizziness and headaches.    Past Medical History:  Diagnosis Date  . Arthritis    "right knee" (05/27/2015)  . Family history of adverse reaction to anesthesia    "it's hard to bring my father back; more than once too" (05/27/2015)  . GERD (gastroesophageal reflux disease)   . Hypercalcemia    parathyroid gland removed  . Hypertension    controlled with medication  . Obesity (BMI 30-39.9)   . 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 Superficial fungus infection of skin    on chest  . Thyroid cancer ([place]) 2016  . Thyroid mass    "2"   Past Surgical History:  Procedure Laterality Date  . CESAREAN SECTION  1997  . IRRIGATION AND DEBRIDEMENT SEBACEOUS CYST  ~ 2010   on back  . PARATHYROIDECTOMY Right 05/27/2015  . PARATHYROIDECTOMY Right 05/27/2015   Procedure: RIGHT PARATHYROIDECTOMY WITH FROZEN SECTION;  Surgeon: Izora Gala, MD;  Location: [facility];  Service: ENT;  Laterality: Right;  . THYROID LOBECTOMY Right 05/27/2015  . THYROIDECTOMY Right 05/27/2015   Procedure: RIGHT THYROID LOBECTOMY WITH  FROZEN SECTION;  Surgeon: Izora Gala, MD;  Location: [facility];  Service: ENT;  Laterality: Right;  . TUBAL LIGATION  1997   Social History:   reports that she has quit smoking. Her smoking use included cigarettes. She has a 1.00 pack-year smoking history. She has never used smokeless tobacco. She reports current alcohol use. She reports that she does not use drugs.  Family History  Problem Relation Age of Onset  . Congestive Heart Failure Mother   . Diabetes Mother 47  . Hypertension Mother   . Congestive Heart Failure Father   . Multiple myeloma Brother 41  . Diabetes Sister 66  . Hypertension Sister   . Hypertension Brother   . Hypertension Brother   . Hyperlipidemia Brother   . Hypertension Brother   . Hyperlipidemia Brother     Medications: Patient's Medications  New Prescriptions   No medications on file  Previous Medications   AMLODIPINE (NORVASC) 10 MG TABLET    TAKE 1 TABLET DAILY   MULTIPLE VITAMINS-MINERALS (MULTIVITAMIN ADULTS 50+ PO)    Take by mouth daily.   ST JOHNS WORT 300 MG CAPS    Take 1 capsule by mouth daily.   TRIAMTERENE-HYDROCHLOROTHIAZIDE (MAXZIDE-25) 37.5-25 MG TABLET    TAKE 1 TABLET DAILY  Modified Medications   No medications on file  Discontinued Medications   No medications on file  Physical Exam:  Vitals:   03/07/21 1548  BP: (!) 138/94  Pulse: 87  Temp: (!) 96.2 F (35.7 C)  TempSrc: Temporal  SpO2: 97%  Weight: 252 lb (114.3 kg)  Height: $Remove'5\' 7"'bwqcJgz$  (1.702 m)   Body mass index is 39.47 kg/m. Wt Readings from Last 3 Encounters:  03/07/21 252 lb (114.3 kg)  01/24/21 248 lb 6.4 oz (112.7 kg)  07/19/20 248 lb (112.5 kg)    Physical Exam Constitutional:      Appearance: Normal appearance.  Skin:    General: Skin is warm and dry.     Findings: Lesion (cluster of vesicles noted to neck and right shoulder) present.  Neurological:     Mental Status: She is alert.    Labs reviewed: Basic Metabolic Panel: Recent Labs     07/17/20 0819 01/20/21 0810  NA 140 142  K 3.6 3.9  CL 103 104  CO2 28 32  GLUCOSE 98 102*  BUN 13 10  CREATININE 0.70 0.73  CALCIUM 9.6 9.6   Liver Function Tests: Recent Labs    07/17/20 0819 01/20/21 0810  AST 18 16  ALT 16 18  BILITOT 0.5 0.6  PROT 7.3 7.3   No results for input(s): LIPASE, AMYLASE in the last 8760 hours. No results for input(s): AMMONIA in the last 8760 hours. CBC: Recent Labs    07/17/20 0819 01/20/21 0810  WBC 3.9 3.8  NEUTROABS 2,145 1,881  HGB 14.3 13.6  HCT 45.0 41.8  MCV 84.7 83.6  PLT 274 255   Lipid Panel: Recent Labs    07/17/20 0819 01/20/21 0810  CHOL 178 165  HDL 44* 40*  LDLCALC 119* 110*  TRIG 63 66  CHOLHDL 4.0 4.1   TSH: No results for input(s): TSH in the last 8760 hours. A1C: Lab Results  Component Value Date   HGBA1C 5.8 (H) 01/20/2021     Assessment/Plan 1. Herpes zoster without complication - valACYclovir (VALTREX) 500 MG tablet; Take 2 tablets (1,000 mg total) by mouth 3 (three) times daily.  Dispense: 42 tablet; Refill: 0 - educated not to apply creams or lotions -educated on spread of zoster -to notify for worsening of symptoms, signs of infection, increase pain.  Carlos American. [place], [facility] [phone]

## 2021-03-26 ENCOUNTER — Ambulatory Visit: Payer: Medicare Other | Admitting: Nurse Practitioner

## 2021-06-03 ENCOUNTER — Other Ambulatory Visit: Payer: Self-pay

## 2021-06-03 ENCOUNTER — Encounter: Payer: Medicare Other | Admitting: Nurse Practitioner

## 2021-06-03 ENCOUNTER — Telehealth: Payer: Self-pay

## 2021-06-03 ENCOUNTER — Encounter: Payer: Self-pay | Admitting: Nurse Practitioner

## 2021-06-03 NOTE — Progress Notes (Signed)
This service is provided via telemedicine  No vital signs collected/recorded due to the encounter was a telemedicine visit.   Location of patient (ex: home, work):  Home  Patient consents to a telephone visit:  yes, see encounter dated 06/03/2021  Location of the provider (ex: office, home):  [place]  Name of any referring provider:  N/A  Names of all persons participating in the telemedicine service and their role in the encounter:  Sherrie Mustache, Nurse Practitioner, Carroll Kinds, CMA, and patient.    Time spent on call:  10 minutes with medical assistant

## 2021-06-03 NOTE — Telephone Encounter (Signed)
Ms. Michelle Campos, Michelle Campos are scheduled for a virtual visit with your provider today.    Just as we do with appointments in the office, we must obtain your consent to participate.  Your consent will be active for this visit and any virtual visit you may have with one of our providers in the next 365 days.    If you have a MyChart account, I can also send a copy of this consent to you electronically.  All virtual visits are billed to your insurance company just like a traditional visit in the office.  As this is a virtual visit, video technology does not allow for your provider to perform a traditional examination.  This may limit your provider's ability to fully assess your condition.  If your provider identifies any concerns that need to be evaluated in person or the need to arrange testing such as labs, EKG, etc, we will make arrangements to do so.    Although advances in technology are sophisticated, we cannot ensure that it will always work on either your end or our end.  If the connection with a video visit is poor, we may have to switch to a telephone visit.  With either a video or telephone visit, we are not always able to ensure that we have a secure connection.   I need to obtain your verbal consent now.   Are you willing to proceed with your visit today?   Michelle Campos has provided verbal consent on 06/03/2021 for a virtual visit (video or telephone).   Michelle Campos, [facility] 06/03/2021  9:39 AM

## 2021-06-03 NOTE — Progress Notes (Signed)
This encounter was created in error - please disregard.

## 2021-07-10 ENCOUNTER — Ambulatory Visit
Admission: RE | Admit: 2021-07-10 | Discharge: 2021-07-10 | Disposition: A | Discharge status: Home / Self Care | Payer: BC Managed Care – PPO | Source: Ambulatory Visit | Attending: Nurse Practitioner | Admitting: Nurse Practitioner

## 2021-07-10 ENCOUNTER — Other Ambulatory Visit: Payer: Self-pay

## 2021-07-10 ENCOUNTER — Other Ambulatory Visit: Payer: BC Managed Care – PPO

## 2021-07-10 ENCOUNTER — Ambulatory Visit
Admission: RE | Admit: 2021-07-10 | Discharge: 2021-07-10 | Disposition: A | Discharge status: Home / Self Care | Payer: TRICARE For Life (TFL) | Source: Ambulatory Visit | Attending: Nurse Practitioner | Admitting: Nurse Practitioner

## 2021-07-10 DIAGNOSIS — E2839 Other primary ovarian failure: Secondary | ICD-10-CM

## 2021-07-10 DIAGNOSIS — Z1231 Encounter for screening mammogram for malignant neoplasm of breast: Secondary | ICD-10-CM

## 2021-07-15 ENCOUNTER — Other Ambulatory Visit: Payer: Self-pay | Admitting: Nurse Practitioner

## 2021-07-16 ENCOUNTER — Encounter: Payer: Self-pay | Admitting: Nurse Practitioner

## 2021-07-16 ENCOUNTER — Ambulatory Visit (INDEPENDENT_AMBULATORY_CARE_PROVIDER_SITE_OTHER): Payer: Medicare PPO | Admitting: Nurse Practitioner

## 2021-07-16 ENCOUNTER — Other Ambulatory Visit: Payer: Self-pay

## 2021-07-16 VITALS — BP 134/82 | HR 90 | Temp 97.1°F | Ht 67.0 in | Wt 251.0 lb

## 2021-07-16 DIAGNOSIS — R739 Hyperglycemia, unspecified: Secondary | ICD-10-CM | POA: Diagnosis not present

## 2021-07-16 DIAGNOSIS — I10 Essential (primary) hypertension: Secondary | ICD-10-CM

## 2021-07-16 DIAGNOSIS — E782 Mixed hyperlipidemia: Secondary | ICD-10-CM

## 2021-07-16 NOTE — Progress Notes (Signed)
Careteam: Patient Care Team: Lauree Chandler, NP as PCP - General (Geriatric Medicine)  PLACE OF SERVICE:  [place] Directive information Does Patient Have a Medical Advance Directive?: No, Would patient like information on creating a medical advance directive?: Yes (MAU/Ambulatory/Procedural Areas - Information given)  Allergies  Allergen Reactions   Skin Adhesives [Cyanoacrylate] Itching    Chief Complaint  Patient presents with   Medical Management of Chronic Issues    6 month follow-up, discuss need for shingrix, pap smear, td/tdap, and flu vaccine or exclude      HPI: Patient is a 65 y.o. female for routine follow up.   She did not get her PAP but did get her bone density and mammogram. She would prefer to go to her GYN for PAP.   Has got back into the gym- going 3 days for 1 hour.  Tries to be active 5 days a week.  Reports she is doing better with her diet but could be better.  Eating a lot of chips.   Retired in June.   Blood pressure at goal today.    Review of Systems:  Review of Systems  Constitutional:  Negative for chills, fever and weight loss.  HENT:  Negative for tinnitus.   Respiratory:  Negative for cough, sputum production and shortness of breath.   Cardiovascular:  Negative for chest pain, palpitations and leg swelling.  Gastrointestinal:  Negative for abdominal pain, constipation, diarrhea and heartburn.  Genitourinary:  Negative for dysuria, frequency and urgency.  Musculoskeletal:  Negative for back pain, falls, joint pain and myalgias.  Skin: Negative.   Neurological:  Negative for dizziness and headaches.  Psychiatric/Behavioral:  Negative for depression and memory loss. The patient does not have insomnia.    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[place])    Superficial fungus infection of skin    on chest   Thyroid cancer ([place]) 2016   Thyroid mass    "2"   Past Surgical History:  Procedure Laterality Date   CESAREAN SECTION  1997   IRRIGATION AND DEBRIDEMENT SEBACEOUS CYST  ~ 2010   on back   PARATHYROIDECTOMY Right 05/27/2015   PARATHYROIDECTOMY Right 05/27/2015   Procedure: RIGHT PARATHYROIDECTOMY WITH FROZEN SECTION;  Surgeon: Izora Gala, MD;  Location: [facility];  Service: ENT;  Laterality: Right;   THYROID LOBECTOMY Right 05/27/2015   THYROIDECTOMY Right 05/27/2015   Procedure: RIGHT THYROID LOBECTOMY WITH FROZEN SECTION;  Surgeon: Izora Gala, MD;  Location: [facility];  Service: ENT;  Laterality: Right;   [place]   Social History:   reports that she has quit smoking. Her smoking use included cigarettes. She has a 1.00 pack-year smoking history. She has never used smokeless tobacco. She reports current alcohol use. She reports that she does not use drugs.  Family History  Problem Relation Age of Onset   Congestive Heart Failure Mother    Diabetes Mother 62   Hypertension Mother    Congestive Heart Failure Father    Multiple myeloma Brother 23   Diabetes Sister 57  Hypertension Sister    Hypertension Brother    Hypertension Brother    Hyperlipidemia Brother    Hypertension Brother    Hyperlipidemia Brother     Medications: Patient's Medications  New Prescriptions   No medications on file  Previous Medications   AMLODIPINE (NORVASC) 10 MG TABLET    TAKE 1 TABLET DAILY   MISC NATURAL PRODUCTS (OSTEO BI-FLEX JOINT SHIELD PO)    Take 1 tablet by mouth daily.   MULTIPLE VITAMINS-MINERALS (MULTIVITAMIN ADULTS 50+ PO)    Take by mouth daily.   ST JOHNS WORT 300 MG CAPS    Take 1 capsule by mouth daily.   TRIAMTERENE-HYDROCHLOROTHIAZIDE  (MAXZIDE-25) 37.5-25 MG TABLET    TAKE 1 TABLET DAILY  Modified Medications   No medications on file  Discontinued Medications   No medications on file    Physical Exam:  Vitals:   07/16/21 0833  BP: 134/82  Pulse: 90  Temp: (!) 97.1 F (36.2 C)  TempSrc: Temporal  SpO2: 99%  Weight: 251 lb (113.9 kg)  Height: _0  (1.702 m)   Body mass index is 39.31 kg/m. Wt Readings from Last 3 Encounters:  07/16/21 251 lb (113.9 kg)  03/07/21 252 lb (114.3 kg)  01/24/21 248 lb 6.4 oz (112.7 kg)    Physical Exam Constitutional:      General: She is not in acute distress.    Appearance: She is well-developed. She is not diaphoretic.  HENT:     Head: Normocephalic and atraumatic.     Mouth/Throat:     Pharynx: No oropharyngeal exudate.  Eyes:     Conjunctiva/sclera: Conjunctivae normal.     Pupils: Pupils are equal, round, and reactive to light.  Cardiovascular:     Rate and Rhythm: Normal rate and regular rhythm.     Heart sounds: Normal heart sounds.  Pulmonary:     Effort: Pulmonary effort is normal.     Breath sounds: Normal breath sounds.  Abdominal:     General: Bowel sounds are normal.     Palpations: Abdomen is soft.  Musculoskeletal:     Cervical back: Normal range of motion and neck supple.     Right lower leg: No edema.     Left lower leg: No edema.  Skin:    General: Skin is warm and dry.  Neurological:     Mental Status: She is alert.  Psychiatric:        Mood and Affect: Mood normal.    Labs reviewed: Basic Metabolic Panel: Recent Labs    07/17/20 0819 01/20/21 0810  NA 140 142  K 3.6 3.9  CL 103 104  CO2 28 32  GLUCOSE 98 102*  BUN 13 10  CREATININE 0.70 0.73  CALCIUM 9.6 9.6   Liver Function Tests: Recent Labs    07/17/20 0819 01/20/21 0810  AST 18 16  ALT 16 18  BILITOT 0.5 0.6  PROT 7.3 7.3   No results for input(s): LIPASE, AMYLASE in the last 8760 hours. No results for input(s): AMMONIA in the last 8760 hours. CBC: Recent  Labs    07/17/20 0819 01/20/21 0810  WBC 3.9 3.8  NEUTROABS 2,145 1,881  HGB 14.3 13.6  HCT 45.0 41.8  MCV 84.7 83.6  PLT 274 255   Lipid Panel: Recent Labs    07/17/20 0819 01/20/21 0810  CHOL 178 165  HDL 44* 40*  LDLCALC 119* 110*  TRIG 63 66  CHOLHDL 4.0 4.1   TSH: No results  for input(s): TSH in the last 8760 hours. A1C: Lab Results  Component Value Date   HGBA1C 5.8 (H) 01/20/2021     Assessment/Plan 1. Essential hypertension --stable. Goal bp <140/90. Continue on norvasc and triamterene-hctz with low sodium diet.  - CBC with Differential/Platelet - CMP with eGFR([facility])  2. Mixed hyperlipidemia -LDL mildly elevated. Continue dietary modifications if continues to elevate will need to consider medication management. - CMP with eGFR([facility]) - Lipid Panel  3. Hyperglycemia -dietary modifications encouraged.  - Hemoglobin A1c  4. Morbid obesity ([place]) -education provided on healthy weight loss through increase in physical activity and proper nutrition   Plans to get PAP with GYN and will get vaccine at pharmacy  Next appt: 6 months for routine follow up [place]. [place], [facility] [phone]

## 2021-07-16 NOTE — Patient Instructions (Addendum)
  Make appt for AWV- in office   VACCINES DUE -COVID booster -Flu vaccine -Shingrix  -tdap    REMEMBER to stay hydrated- make sure you drink more water when you are exercising and/or outside.   Take your time getting up and changing positions.

## 2021-07-17 ENCOUNTER — Other Ambulatory Visit: Payer: Self-pay

## 2021-07-17 DIAGNOSIS — E782 Mixed hyperlipidemia: Secondary | ICD-10-CM

## 2021-07-17 LAB — CBC WITH DIFFERENTIAL/PLATELET
Absolute Monocytes: 344 cells/uL (ref 200–950)
Basophils Absolute: 22 cells/uL (ref 0–200)
Basophils Relative: 0.5 %
Eosinophils Absolute: 292 cells/uL (ref 15–500)
Eosinophils Relative: 6.8 %
HCT: 43.8 % (ref 35.0–45.0)
Hemoglobin: 13.7 g/dL (ref 11.7–15.5)
Lymphs Abs: 1273 cells/uL (ref 850–3900)
MCH: 26.9 pg — ABNORMAL LOW (ref 27.0–33.0)
MCHC: 31.3 g/dL — ABNORMAL LOW (ref 32.0–36.0)
MCV: 85.9 fL (ref 80.0–100.0)
MPV: 12.4 fL (ref 7.5–12.5)
Monocytes Relative: 8 %
Neutro Abs: 2369 cells/uL (ref 1500–7800)
Neutrophils Relative %: 55.1 %
Platelets: 273 10*3/uL (ref 140–400)
RBC: 5.1 10*6/uL (ref 3.80–5.10)
RDW: 14.7 % (ref 11.0–15.0)
Total Lymphocyte: 29.6 %
WBC: 4.3 10*3/uL (ref 3.8–10.8)

## 2021-07-17 LAB — COMPLETE METABOLIC PANEL WITH GFR
AG Ratio: 1.4 (calc) (ref 1.0–2.5)
ALT: 18 U/L (ref 6–29)
AST: 16 U/L (ref 10–35)
Albumin: 4.3 g/dL (ref 3.6–5.1)
Alkaline phosphatase (APISO): 96 U/L (ref 37–153)
BUN: 14 mg/dL (ref 7–25)
CO2: 33 mmol/L — ABNORMAL HIGH (ref 20–32)
Calcium: 9.7 mg/dL (ref 8.6–10.4)
Chloride: 104 mmol/L (ref 98–110)
Creat: 0.78 mg/dL (ref 0.50–1.05)
Globulin: 3.1 g/dL (calc) (ref 1.9–3.7)
Glucose, Bld: 102 mg/dL — ABNORMAL HIGH (ref 65–99)
Potassium: 3.9 mmol/L (ref 3.5–5.3)
Sodium: 143 mmol/L (ref 135–146)
Total Bilirubin: 0.5 mg/dL (ref 0.2–1.2)
Total Protein: 7.4 g/dL (ref 6.1–8.1)
eGFR: 84 mL/min/{1.73_m2} (ref >=60)

## 2021-07-17 LAB — HEMOGLOBIN A1C
Hgb A1c MFr Bld: 5.7 % of total Hgb — ABNORMAL HIGH (ref <5.7)
Mean Plasma Glucose: 117 mg/dL
eAG (mmol/L): 6.5 mmol/L

## 2021-07-17 LAB — LIPID PANEL
Cholesterol: 185 mg/dL (ref <200)
HDL: 42 mg/dL — ABNORMAL LOW (ref >=50)
LDL Cholesterol (Calc): 123 mg/dL (calc) — ABNORMAL HIGH
Non-HDL Cholesterol (Calc): 143 mg/dL (calc) — ABNORMAL HIGH (ref <130)
Total CHOL/HDL Ratio: 4.4 (calc) (ref <5.0)
Triglycerides: 101 mg/dL (ref <150)

## 2021-07-21 ENCOUNTER — Encounter: Payer: Self-pay | Admitting: Family

## 2021-07-21 ENCOUNTER — Ambulatory Visit (INDEPENDENT_AMBULATORY_CARE_PROVIDER_SITE_OTHER): Payer: Medicare PPO | Admitting: Family

## 2021-07-21 ENCOUNTER — Other Ambulatory Visit: Payer: Self-pay

## 2021-07-21 VITALS — BP 150/100 | HR 95 | Temp 97.5°F | Resp 16 | Ht 67.0 in | Wt 250.4 lb

## 2021-07-21 DIAGNOSIS — I10 Essential (primary) hypertension: Secondary | ICD-10-CM

## 2021-07-21 DIAGNOSIS — R42 Dizziness and giddiness: Secondary | ICD-10-CM

## 2021-07-21 MED ORDER — CLONIDINE HCL 0.1 MG PO TABS
0.1000 mg | ORAL_TABLET | Freq: Once | ORAL | Status: AC
  Administered 2021-07-21: 0.1 mg via ORAL

## 2021-07-21 MED ORDER — MECLIZINE HCL 12.5 MG PO TABS: 12.5000 mg | ORAL_TABLET | Freq: Three times a day (TID) | ORAL | 0 refills | Status: DC | PRN

## 2021-07-21 NOTE — Progress Notes (Signed)
Provider: Kanai Berrios FNP-C  Lauree Chandler, NP  Patient Care Team: Lauree Chandler, NP as PCP - General (Geriatric Medicine)  Extended Emergency Contact Information Primary Emergency Contact: [facility] Address: [place], [place] [address] Johnnette Litter of [place] Phone: [phone] Mobile Phone: [phone] Relation: Sister  Code Status:  Full code  Goals of care: Advanced Directive information Advanced Directives 07/21/2021  Does Patient Have a Medical Advance Directive? No  Does patient want to make changes to medical advance directive? -  Would patient like information on creating a medical advance directive? No - Patient declined     Chief Complaint  Patient presents with   Acute Visit    Complains of dizziness.     HPI:  Pt is a 65 y.o. female seen today for an acute visit for evaluation of dizziness Rooming was spinning.All this weekend has been feeling dizziness worst when lying.dizziness began 07/15/2021 prior to seeing PCP E[place],NP on 07/16/2021.states was told by NP to increase water intake since she was not drinking enough fluids.Has increased water intake but dizziness persist. Had recent change in eye glasses but no vision changes.Feels dizzy even at night when lying down so does not think it her vision.  Has had some slight headache.   Her blood pressure is very high today but things stressed out due to not knowing what is causing the dizziness.  Blood pressure goes up and down at home in the 130's /90's. Hard ringing in left ear that came and left but was very loud then dizziness happen.   [place] which helps her calm her down and focus.Took st John's wort especially when she was still working.Has been on it for many years.Has not had dizziness with it.    She denies any denies any fatigue,chest tightness,palpitation,chest pain or shortness of breath.      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[place])    Superficial fungus infection of skin    on chest   Thyroid cancer ([place]) 2016   Thyroid mass    "2"   Past Surgical History:  Procedure Laterality Date   CESAREAN SECTION  1997   IRRIGATION AND DEBRIDEMENT SEBACEOUS CYST  ~ 2010   on back   PARATHYROIDECTOMY Right 05/27/2015   PARATHYROIDECTOMY Right 05/27/2015   Procedure: RIGHT PARATHYROIDECTOMY WITH FROZEN SECTION;  Surgeon: Izora Gala, MD;  Location: [facility];  Service: ENT;  Laterality: Right;   THYROID LOBECTOMY Right 05/27/2015   THYROIDECTOMY Right 05/27/2015   Procedure: RIGHT THYROID LOBECTOMY WITH FROZEN SECTION;  Surgeon: Izora Gala, MD;  Location: [facility];  Service: ENT;  Laterality: Right;   TUBAL LIGATION  1997    Allergies  Allergen Reactions   Skin Adhesives [Cyanoacrylate] Itching    Outpatient Encounter Medications as of 07/21/2021  Medication Sig   amLODipine (NORVASC) 10 MG tablet TAKE 1 TABLET DAILY   Misc Natural Products (OSTEO BI-FLEX JOINT SHIELD PO) Take 1 tablet by mouth daily.   Multiple Vitamins-Minerals (MULTIVITAMIN ADULTS 50+ PO) Take  by mouth daily.   St Johns Wort 300 MG CAPS Take 1 capsule by mouth daily.   triamterene-hydrochlorothiazide (MAXZIDE-25) 37.5-25 MG tablet TAKE 1 TABLET DAILY   No facility-administered encounter medications on file as of 07/21/2021.    Review of Systems  Constitutional:  Negative for appetite change, chills, fatigue, fever and unexpected weight change.  HENT:  Negative for congestion, dental problem, ear discharge, ear pain, facial swelling, hearing loss, nosebleeds, postnasal drip, rhinorrhea, sinus pressure, sinus pain,  sneezing, sore throat, tinnitus and trouble swallowing.   Eyes:  Negative for pain, discharge, redness, itching and visual disturbance.  Respiratory:  Negative for cough, chest tightness, shortness of breath and wheezing.   Cardiovascular:  Negative for chest pain, palpitations and leg swelling.  Gastrointestinal:  Negative for abdominal distention, abdominal pain, blood in stool, constipation, diarrhea, nausea and vomiting.  Endocrine: Negative for cold intolerance, heat intolerance, polydipsia, polyphagia and polyuria.  Genitourinary:  Negative for difficulty urinating, dysuria, flank pain, frequency and urgency.  Musculoskeletal:  Negative for arthralgias, back pain, gait problem, joint swelling, myalgias, neck pain and neck stiffness.  Skin:  Negative for color change, pallor, rash and wound.  Neurological:  Positive for dizziness. Negative for syncope, speech difficulty, weakness, light-headedness, numbness and headaches.  Hematological:  Does not bruise/bleed easily.  Psychiatric/Behavioral:  Negative for agitation, behavioral problems, confusion, hallucinations, self-injury, sleep disturbance and suicidal ideas. The patient is not nervous/anxious.    Immunization History  Administered Date(s) Administered   Influenza, Quadrivalent, Recombinant, Inj, Pf 08/24/2018   Influenza,inj,Quad PF,6+ Mos 07/25/2019, 07/19/2020   Moderna Sars-Covid-2 Vaccination 11/28/2020   PFIZER(Purple Top)SARS-COV-2 Vaccination 01/26/2020, 02/17/2020   PPD Test 08/07/2019   Pneumococcal Conjugate-13 01/24/2021   Pertinent  Health Maintenance Due  Topic Date Due   PAP SMEAR-Modifier  01/14/2021   INFLUENZA VACCINE  06/30/2021   PNA vac Low Risk Adult (2 of 2 - PPSV23) 01/24/2022   MAMMOGRAM  07/11/2023   DEXA SCAN  Completed   Fall Risk  07/21/2021 06/03/2021 01/17/2020 07/11/2019 01/10/2019  Falls in the past year? 0 0 0 0 0  Number falls in past yr: 0 0 0 0 0  Injury with Fall? 0 0 0 0 0  Risk for fall  due to : No Fall Risks No Fall Risks - - -  Follow up Falls evaluation completed Falls evaluation completed - - -   Functional Status Survey:    Vitals:   07/21/21 1023  BP: (!) 150/100  Pulse: 95  Resp: 16  Temp: (!) 97.5 F (36.4 C)  SpO2: 97%  Weight: 250 lb 6.4 oz (113.6 kg)  Height: '5\' 7"'$  (1.702 m)   Body mass index is 39.22 kg/m. Physical Exam Vitals reviewed.  Constitutional:      General: She is not in acute distress.    Appearance: Normal appearance. She is obese. She is not ill-appearing or diaphoretic.  HENT:     Head: Normocephalic.     Right Ear: Tympanic membrane, ear canal and external ear normal. There is no impacted cerumen.     Left Ear: Tympanic membrane, ear canal and external ear normal. There is no impacted cerumen.     Nose: Nose normal. No congestion or rhinorrhea.     Mouth/Throat:     Mouth: Mucous membranes are moist.     Pharynx: Oropharynx is clear. No oropharyngeal exudate or posterior oropharyngeal erythema.  Eyes:     General: No scleral icterus.       Right eye:  No discharge.        Left eye: No discharge.     Extraocular Movements: Extraocular movements intact.     Conjunctiva/sclera: Conjunctivae normal.     Pupils: Pupils are equal, round, and reactive to light.  Neck:     Vascular: No carotid bruit.  Cardiovascular:     Rate and Rhythm: Normal rate and regular rhythm.     Pulses: Normal pulses.     Heart sounds: Normal heart sounds. No murmur heard.   No friction rub. No gallop.  Pulmonary:     Effort: Pulmonary effort is normal. No respiratory distress.     Breath sounds: Normal breath sounds. No wheezing, rhonchi or rales.  Chest:     Chest wall: No tenderness.  Abdominal:     General: Bowel sounds are normal. There is no distension.     Palpations: Abdomen is soft. There is no mass.     Tenderness: There is no abdominal tenderness. There is no right CVA tenderness, left CVA tenderness, guarding or rebound.   Musculoskeletal:        General: No swelling or tenderness. Normal range of motion.     Cervical back: Normal range of motion. No rigidity or tenderness.     Right lower leg: No edema.     Left lower leg: No edema.  Lymphadenopathy:     Cervical: No cervical adenopathy.  Skin:    General: Skin is warm and dry.     Coloration: Skin is not pale.     Findings: No bruising, erythema, lesion or rash.  Neurological:     Mental Status: She is alert and oriented to person, place, and time.     Cranial Nerves: No cranial nerve deficit.     Sensory: No sensory deficit.     Motor: No weakness.     Coordination: Coordination normal.     Gait: Gait normal.  Psychiatric:        Mood and Affect: Mood normal.        Speech: Speech normal.        Behavior: Behavior normal.        Thought Content: Thought content normal.        Judgment: Judgment normal.    Labs reviewed: Recent Labs    01/20/21 0810 07/16/21 0904  NA 142 143  K 3.9 3.9  CL 104 104  CO2 32 33*  GLUCOSE 102* 102*  BUN 10 14  CREATININE 0.73 0.78  CALCIUM 9.6 9.7   Recent Labs    01/20/21 0810 07/16/21 0904  AST 16 16  ALT 18 18  BILITOT 0.6 0.5  PROT 7.3 7.4   Recent Labs    01/20/21 0810 07/16/21 0904  WBC 3.8 4.3  NEUTROABS 1,881 2,369  HGB 13.6 13.7  HCT 41.8 43.8  MCV 83.6 85.9  PLT 255 273   Lab Results  Component Value Date   TSH 1.96 02/13/2019   Lab Results  Component Value Date   HGBA1C 5.7 (H) 07/16/2021   Lab Results  Component Value Date   CHOL 185 07/16/2021   HDL 42 (L) 07/16/2021   LDLCALC 123 (H) 07/16/2021   TRIG 101 07/16/2021   CHOLHDL 4.4 07/16/2021    Significant Diagnostic Results in last 30 days:  DG Bone Density  Result Date: 07/10/2021 EXAM: DUAL X-RAY ABSORPTIOMETRY (DXA) FOR BONE MINERAL DENSITY IMPRESSION: Referring Physician:  Lauree Chandler Your patient completed a bone mineral density test using GE Lunar iDXA  system (analysis version: 16). Technologist:  Cherry Log PATIENT: Name: Emary, Fragoso Patient ID: [id number] Birth Date: 1956/08/17 Height: 67.5 in. Sex: Female Measured: 07/10/2021 Weight: 252.6 lbs. Indications: Estrogen Deficient, Postmenopausal, Thyroidectomy Fractures: None Treatments: Multivitamin ASSESSMENT: The BMD measured at AP Spine L1-L4 is 1.072 g/cm2 with a T-score of -0.9. This patient is considered normal according to [facility]) criteria. The quality of the exam is good. Site Region Measured Date Measured Age YA BMD Significant CHANGE T-score AP Spine  L1-L4      07/10/2021    [age]         -0.9    1.072 g/cm2 DualFemur Neck Right 07/10/2021    65.4         0.1     1.049 g/cm2 DualFemur Total Mean 07[age]10/2021    65.4         0.8     1.104 g/cm2 World Health Organization [facility]) criteria for post-menopausal, Caucasian Women: Normal       T-score at or above -1 SD Osteopenia   T-score between -1 and -2.5 SD Osteoporosis T-score at or below -2.5 SD RECOMMENDATION: 1. All patients should optimize calcium and vitamin D intake. 2. Consider FDA-approved medical therapies in postmenopausal women and men aged 48 years and older, based on the following: a. A hip or vertebral (clinical or morphometric) fracture. b. T-score = -2.5 at the femoral neck or spine after appropriate evaluation to exclude secondary causes. c. Low bone mass (T-score between -1.0 and -2.5 at the femoral neck or spine) and a 10-year probability of a hip fracture = 3% or a 10-year probability of a major osteoporosis-related fracture = 20% based on the [place]-adapted WHO algorithm. d. Clinician judgment and/or patient preferences may indicate treatment for people with 10-year fracture probabilities above or below these levels. FOLLOW-UP: Patients with diagnosis of osteoporosis or at high risk for fracture should have regular bone mineral density tests.? Patients eligible for Medicare are allowed routine testing every 2 years.? The testing frequency can be increased to one  year for patients who have rapidly progressing disease, are receiving or discontinuing medical therapy to restore bone mass, or have additional risk factors. I have reviewed this study and agree with the findings. Mark A. Thornton Papas, M.D. [facility] Electronically Signed   By: Lavonia Dana M.D.   On: 07/10/2021 15:27   MM 3D SCREEN BREAST BILATERAL  Result Date: 07/11/2021 CLINICAL DATA:  Screening. EXAM: DIGITAL SCREENING BILATERAL MAMMOGRAM WITH TOMOSYNTHESIS AND CAD TECHNIQUE: Bilateral screening digital craniocaudal and mediolateral oblique mammograms were obtained. Bilateral screening digital breast tomosynthesis was performed. The images were evaluated with computer-aided detection. COMPARISON:  Previous exam(s). ACR Breast Density Category b: There are scattered areas of fibroglandular density. FINDINGS: There are no findings suspicious for malignancy. IMPRESSION: No mammographic evidence of malignancy. A result letter of this screening mammogram will be mailed directly to the patient. RECOMMENDATION: Screening mammogram in one year. (Code:SM-B-01Y) BI-RADS CATEGORY  1: Negative. Electronically Signed   By: Lillia Mountain M.D.   On: 07/11/2021 13:41    Assessment/Plan  Dizziness Ongoing since 07/14/2021 has tried increasing water intake without any relief.initially had loud ringing in the left ear which resolved then dizziness started. - EKG 12-Lead done showed  Normal sinus Rhythm with HR 91 b/min no changes compared to EKG done 01/10/2019.  - Negative exam findings.suspect possible related to high blood pressure but home readings are lower so possible viral verse ear related.will start on Meclizine as below.side  effects discussed and additional education information provided on AVS - Advised to notify provider if symptoms worsen. - will follow up in two weeks to re-evaluate if meclizine ineffective consider referral to Neurology.  - meclizine (ANTIVERT) 12.5 MG tablet; Take 1 tablet (12.5  mg total) by mouth 3 (three) times daily as needed for dizziness.  Dispense: 30 tablet; Refill: 0  2. Essential hypertension B/p reading elevated today though reports stressed out due to dizziness not knowing what's causing her to be dizzy. Home B/p readings are normal. - clonidine administered during visit but left prior to CMA rechecking B/p  - Advised to check Blood pressure at home and record on log provided and notify provider if B/p > 140/90  - cloNIDine (CATAPRES) tablet 0.1 mg - EKG 12-Lead showed  Normal sinus Rhythm with HR 91 b/min no changes compared to EKG done 01/10/2019.  - follow up in 2 weeks to recheck B/p.   Family/ staff Communication: Reviewed plan of care with patient verbalized understanding.   Labs/tests ordered: None   Next Appointment: 2 weeks for follow up Dizziness and high blood pressure.  Sandrea Hughs, NP

## 2021-07-21 NOTE — Patient Instructions (Addendum)
- Please check Blood pressure at home and record on log provided and notify provider if B/p > 140/90   Dizziness Dizziness is a common problem. It makes you feel unsteady or light-headed. You may feel like you are about to pass out (faint). Dizziness can lead to getting hurt if you stumble or fall. Dizziness can be caused by many things, including: Medicines. Not having enough water in your body (dehydration). Illness. Follow these instructions at home: Eating and drinking  Drink enough fluid to keep your pee (urine) pale yellow. This helps to keep you from getting dehydrated. Try to drink more clear fluids, such as water. Do not drink alcohol. Limit how much caffeine you drink or eat, if your doctor tells you to do that. Limit how much salt (sodium) you drink or eat, if your doctor tells you to do that.  Activity  Avoid making quick movements. Stand up slowly from sitting in a chair, and steady yourself until you feel okay. In the morning, first sit up on the side of the bed. When you feel okay, stand up slowly while you hold onto something. Do this until you know that your balance is okay. If you need to stand in one place for a long time, move your legs often. Tighten and relax the muscles in your legs while you are standing. Do not drive or use machinery if you feel dizzy. Avoid bending down if you feel dizzy. Place items in your home so you can reach them easily without leaning over.  Lifestyle Do not smoke or use any products that contain nicotine or tobacco. If you need help quitting, ask your doctor. Try to lower your stress level. You can do this by using methods such as yoga or meditation. Talk with your doctor if you need help. General instructions Watch your dizziness for any changes. Take over-the-counter and prescription medicines only as told by your doctor. Talk with your doctor if you think that you are dizzy because of a medicine that you are taking. Tell a friend or a  family member that you are feeling dizzy. If he or she notices any changes in your behavior, have this person call your doctor. Keep all follow-up visits. Contact a doctor if: Your dizziness does not go away. Your dizziness or light-headedness gets worse. You feel like you may vomit (are nauseous). You have trouble hearing. You have new symptoms. You are unsteady on your feet. You feel like the room is spinning. You have neck pain or a stiff neck. You have a fever. Get help right away if: You vomit or have watery poop (diarrhea), and you cannot eat or drink anything. You have trouble: Talking. Walking. Swallowing. Using your arms, hands, or legs. You feel generally weak. You are not thinking clearly, or you have trouble forming sentences. A friend or family member may notice this. You have: Chest pain. Pain in your belly (abdomen). Shortness of breath. Sweating. Your vision changes. You are bleeding. You have a very bad headache. These symptoms may be an emergency. Get help right away. Call your local emergency services (911 in the [place] Do not wait to see if the symptoms will go away. Do not drive yourself to the hospital. Summary Dizziness makes you feel unsteady or light-headed. You may feel like you are about to pass out (faint). Drink enough fluid to keep your pee (urine) pale yellow. Do not drink alcohol. Avoid making quick movements if you feel dizzy. Watch your dizziness for any  changes. This information is not intended to replace advice given to you by your health care provider. Make sure you discuss any questions you have with your healthcare provider. Document Revised: 10/21/2020 Document Reviewed: 10/21/2020 Elsevier Patient Education  2022 [place] chewable tablets What is this medication? MECLIZINE (MEK li zeen) is an antihistamine. It is used to prevent nausea, vomiting, or dizziness caused by motion sickness. It is also used to prevent and  treat vertigo (extreme dizziness or a feeling that you or your surroundingsare tilting or spinning around). This medicine may be used for other purposes; ask your health care provider orpharmacist if you have questions. COMMON BRAND NAME(S): Antivert, Bonine, Travel Sickness What should I tell my care team before I take this medication? They need to know if you have any of these conditions: glaucoma lung or breathing disease, like asthma problems urinating prostate disease stomach or intestine problems an unusual or allergic reaction to meclizine, other medicines, foods, dyes, or preservatives pregnant or trying to get pregnant breast-feeding How should I use this medication? Take this medicine by mouth with a glass of water. Chew it completely or swallow whole. Follow the directions on the prescription label. If you are using this medicine to prevent motion sickness, take the dose at least 1 hour before travel. If it upsets your stomach, take it with food or milk. Take yourdoses at regular intervals. Do not take your medicine more often than directed. Talk to your pediatrician regarding the use of this medicine in children. Whilethis drug may be prescribed for selected conditions, precautions do apply. Overdosage: If you think you have taken too much of this medicine contact apoison control center or emergency room at once. NOTE: This medicine is only for you. Do not share this medicine with others. What if I miss a dose? If you miss a dose, take it as soon as you can. If it is almost time for yournext dose, take only that dose. Do not take double or extra doses. What may interact with this medication? Do not take this medicine with any of the following medications: MAOIs like Carbex, Eldepryl, Marplan, Nardil, and Parnate This medicine may also interact with the following medications: alcohol antihistamines for allergy, cough and cold certain medicines for anxiety or sleep certain  medicines for depression, like amitriptyline, fluoxetine, sertraline certain medicines for seizures like phenobarbital, primidone general anesthetics like halothane, isoflurane, methoxyflurane, propofol local anesthetics like lidocaine, pramoxine, tetracaine medicines that relax muscles for surgery narcotic medicines for pain phenothiazines like chlorpromazine, mesoridazine, prochlorperazine, thioridazine This list may not describe all possible interactions. Give your health care provider a list of all the medicines, herbs, non-prescription drugs, or dietary supplements you use. Also tell them if you smoke, drink alcohol, or use illegaldrugs. Some items may interact with your medicine. What should I watch for while using this medication? Tell your doctor or healthcare professional if your symptoms do not start toget better or if they get worse. You may get drowsy or dizzy. Do not drive, use machinery, or do anything that needs mental alertness until you know how this medicine affects you. Do not stand or sit up quickly, especially if you are an older patient. This reduces the risk of dizzy or fainting spells. Alcohol may interfere with the effect ofthis medicine. Avoid alcoholic drinks. Your mouth may get dry. Chewing sugarless gum or sucking hard candy, and drinking plenty of water may help. Contact your doctor if the problem does notgo away or is  severe. This medicine may cause dry eyes and blurred vision. If you wear contact lenses you may feel some discomfort. Lubricating drops may help. See your eye doctorif the problem does not go away or is severe. What side effects may I notice from receiving this medication? Side effects that you should report to your doctor or health care professionalas soon as possible: feeling faint or lightheaded, falls fast, irregular heartbeat Side effects that usually do not require medical attention (report to yourdoctor or health care professional if they continue  or are bothersome): constipation headache trouble passing urine or change in the amount of urine trouble sleeping upset stomach This list may not describe all possible side effects. Call your doctor for medical advice about side effects. You may report side effects to FDA at1-800-FDA-1088. Where should I keep my medication? Keep out of the reach of children. Store at room temperature between 15 and 30 degrees C (59 and 86 degrees F). Keep container tightly closed. Throw away any unused medicine after theexpiration date. NOTE: This sheet is a summary. It may not cover all possible information. If you have questions about this medicine, talk to your doctor, pharmacist, orhealth care provider.  2022 Elsevier/Gold Standard (2015-12-19 10:44:29)

## 2021-08-05 ENCOUNTER — Encounter: Payer: Medicare Other | Admitting: Nurse Practitioner

## 2021-08-07 ENCOUNTER — Encounter: Payer: Self-pay | Admitting: Family

## 2021-08-08 ENCOUNTER — Ambulatory Visit (INDEPENDENT_AMBULATORY_CARE_PROVIDER_SITE_OTHER): Payer: Medicare PPO | Admitting: Family

## 2021-08-08 ENCOUNTER — Encounter: Payer: Self-pay | Admitting: Family

## 2021-08-08 ENCOUNTER — Other Ambulatory Visit: Payer: Self-pay

## 2021-08-08 VITALS — BP 142/98 | HR 95 | Temp 97.7°F | Resp 16 | Ht 67.0 in | Wt 251.4 lb

## 2021-08-08 DIAGNOSIS — I10 Essential (primary) hypertension: Secondary | ICD-10-CM

## 2021-08-08 DIAGNOSIS — Z6838 Body mass index (BMI) 38.0-38.9, adult: Secondary | ICD-10-CM | POA: Diagnosis not present

## 2021-08-08 DIAGNOSIS — R42 Dizziness and giddiness: Secondary | ICD-10-CM | POA: Diagnosis not present

## 2021-08-08 NOTE — Progress Notes (Signed)
Provider: Tannar Broker FNP-C  Lauree Chandler, NP  Patient Care Team: Lauree Chandler, NP as PCP - General (Geriatric Medicine)  Extended Emergency Contact Information Primary Emergency Contact: [facility] Address: [place], [place] [address] [address]net[address] Litter of [place] Phone: [phone] Mobile Phone: [phone] Relation: Sister  Code Status:  Full Code  Goals of care: Advanced Directive information Advanced Directives 08/07/2021  Does Patient Have a Medical Advance Directive? No  Does patient want to make changes to medical advance directive? -  Would patient like information on creating a medical advance directive? No - Patient declined     Chief Complaint  Patient presents with   Medical Management of Chronic Issues    2 week follow up on Blood Pressure.    HPI:  Pt is a 65 y.o. female seen today for an acute visit for 2 weeks follow up for high blood pressure.she was here 07/21/2021 B/p 150/100 and complains of dizziness.clonidine was given with improved B/p to 140/90.EKG was normal.Meclizine 12.5 mg was ordered for dizziness. Advised to follow up today for B/p and to bring her home log.  States symptoms have improved still gets dizzy every once in a while. Plans to start aerobic exercise and modify her diet. denies any headache,vision changes,fatigue,chest tightness,palpitation,chest pain or shortness of breath.       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[place])    Superficial fungus infection of skin    on chest   Thyroid cancer ([place]) 2016   Thyroid mass    "2"   Past Surgical History:   Procedure Laterality Date   CESAREAN SECTION  1997   IRRIGATION AND DEBRIDEMENT SEBACEOUS CYST  ~ 2010   on back   PARATHYROIDECTOMY Right 05/27/2015   PARATHYROIDECTOMY Right 05/27/2015   Procedure: RIGHT PARATHYROIDECTOMY WITH FROZEN SECTION;  Surgeon: Izora Gala, MD;  Location: [facility];  Service: ENT;  Laterality: Right;   THYROID LOBECTOMY Right 05/27/2015   THYROIDECTOMY Right 05/27/2015   Procedure: RIGHT THYROID LOBECTOMY WITH FROZEN SECTION;  Surgeon: Izora Gala, MD;  Location: [facility];  Service: ENT;  Laterality: Right;   TUBAL LIGATION  1997    Allergies  Allergen Reactions   Skin Adhesives [Cyanoacrylate] Itching    Outpatient Encounter Medications as of 08/08/2021  Medication Sig   amLODipine (NORVASC) 10 MG tablet TAKE 1 TABLET DAILY   meclizine (ANTIVERT) 12.5 MG tablet Take 1 tablet (12.5 mg total) by mouth 3 (three) times daily as needed for dizziness.   Misc Natural Products (OSTEO BI-FLEX JOINT SHIELD PO) Take 1 tablet by mouth daily.   Multiple Vitamins-Minerals (MULTIVITAMIN ADULTS 50+ PO) Take by mouth daily.   St Johns Wort 300 MG CAPS Take 1 capsule by mouth daily.   triamterene-hydrochlorothiazide (MAXZIDE-25) 37.5-25 MG tablet TAKE 1 TABLET DAILY   No facility-administered encounter medications on file as of 08/08/2021.    Review of Systems  Constitutional:  Negative for appetite change, chills, fatigue, fever and unexpected weight change.  Eyes:  Negative for pain, discharge, redness, itching  and visual disturbance.  Respiratory:  Negative for cough, chest tightness, shortness of breath and wheezing.   Cardiovascular:  Negative for chest pain, palpitations and leg swelling.  Gastrointestinal:  Negative for abdominal distention, abdominal pain, blood in stool, constipation, diarrhea, nausea and vomiting.  Genitourinary:  Negative for difficulty urinating, dysuria, flank pain, frequency and urgency.  Musculoskeletal:  Negative for arthralgias, back pain, gait  problem, joint swelling, myalgias, neck pain and neck stiffness.  Skin:  Negative for color change, pallor and rash.  Neurological:  Negative for syncope, speech difficulty, weakness, light-headedness, numbness and headaches.       Occasional dizziness but has improved   Hematological:  Does not bruise/bleed easily.  Psychiatric/Behavioral:  Negative for agitation, behavioral problems, confusion, hallucinations, self-injury, sleep disturbance and suicidal ideas. The patient is not nervous/anxious.    Immunization History  Administered Date(s) Administered   Influenza, Quadrivalent, Recombinant, Inj, Pf 08/24/2018   Influenza,inj,Quad PF,6+ Mos 07/25/2019, 07/19/2020   Moderna Sars-Covid-2 Vaccination 11/28/2020   PFIZER(Purple Top)SARS-COV-2 Vaccination 01/26/2020, 02/17/2020   PPD Test 08/07/2019   Pneumococcal Conjugate-13 01/24/2021   Pertinent  Health Maintenance Due  Topic Date Due   PAP SMEAR-Modifier  01/14/2021   INFLUENZA VACCINE  06/30/2021   PNA vac Low Risk Adult (2 of 2 - PPSV23) 01/24/2022   MAMMOGRAM  07/11/2023   DEXA SCAN  Completed   Fall Risk  08/07/2021 07/21/2021 06/03/2021 01/17/2020 07/11/2019  Falls in the past year? 0 0 0 0 0  Number falls in past yr: 0 0 0 0 0  Injury with Fall? 0 0 0 0 0  Risk for fall due to : No Fall Risks No Fall Risks No Fall Risks - -  Follow up Falls evaluation completed Falls evaluation completed Falls evaluation completed - -   Functional Status Survey:    Vitals:   08/08/21 0909  BP: (!) 142/98  Pulse: 95  Resp: 16  Temp: 97.7 F (36.5 C)  SpO2: 98%  Weight: 251 lb 6.4 oz (114 kg)  Height: '5\' 7"'$  (1.702 m)   Body mass index is 39.37 kg/m. Physical Exam Vitals reviewed.  Constitutional:      General: She is not in acute distress.    Appearance: Normal appearance. She is normal weight. She is not ill-appearing or diaphoretic.  HENT:     Head: Normocephalic.  Eyes:     General: No scleral icterus.       Right eye: No  discharge.        Left eye: No discharge.     Conjunctiva/sclera: Conjunctivae normal.     Pupils: Pupils are equal, round, and reactive to light.  Cardiovascular:     Rate and Rhythm: Normal rate and regular rhythm.     Pulses: Normal pulses.     Heart sounds: Normal heart sounds. No murmur heard.   No friction rub. No gallop.  Pulmonary:     Effort: Pulmonary effort is normal. No respiratory distress.     Breath sounds: Normal breath sounds. No wheezing, rhonchi or rales.  Chest:     Chest wall: No tenderness.  Abdominal:     General: Bowel sounds are normal. There is no distension.     Palpations: Abdomen is soft. There is no mass.     Tenderness: There is no abdominal tenderness. There is no right CVA tenderness, left CVA tenderness, guarding or rebound.  Musculoskeletal:        General: No swelling or tenderness. Normal range of motion.  Right lower leg: No edema.     Left lower leg: No edema.  Skin:    General: Skin is warm and dry.     Coloration: Skin is not pale.     Findings: No bruising or erythema.  Neurological:     Mental Status: She is alert and oriented to person, place, and time.     Motor: No weakness.     Gait: Gait normal.  Psychiatric:        Mood and Affect: Mood normal.        Speech: Speech normal.        Behavior: Behavior normal.        Thought Content: Thought content normal.        Judgment: Judgment normal.    Labs reviewed: Recent Labs    01/20/21 0810 07/16/21 0904  NA 142 143  K 3.9 3.9  CL 104 104  CO2 32 33*  GLUCOSE 102* 102*  BUN 10 14  CREATININE 0.73 0.78  CALCIUM 9.6 9.7   Recent Labs    01/20/21 0810 07/16/21 0904  AST 16 16  ALT 18 18  BILITOT 0.6 0.5  PROT 7.3 7.4   Recent Labs    01/20/21 0810 07/16/21 0904  WBC 3.8 4.3  NEUTROABS 1,881 2,369  HGB 13.6 13.7  HCT 41.8 43.8  MCV 83.6 85.9  PLT 255 273   Lab Results  Component Value Date   TSH 1.96 02/13/2019   Lab Results  Component Value Date    HGBA1C 5.7 (H) 07/16/2021   Lab Results  Component Value Date   CHOL 185 07/16/2021   HDL 42 (L) 07/16/2021   LDLCALC 123 (H) 07/16/2021   TRIG 101 07/16/2021   CHOLHDL 4.4 07/16/2021    Significant Diagnostic Results in last 30 days:  DG Bone Density  Result Date: 07/10/2021 EXAM: DUAL X-RAY ABSORPTIOMETRY (DXA) FOR BONE MINERAL DENSITY IMPRESSION: Referring Physician:  Lauree Chandler Your patient completed a bone mineral density test using GE Lunar iDXA system (analysis version: 16). Technologist: [place] PATIENT: Name: Dianey, Belarde Patient ID: [id number] Birth Date: 1956-04-30 Height: 67.5 in. Sex: Female Measured: 07/10/2021 Weight: 252.6 lbs. Indications: Estrogen Deficient, Postmenopausal, Thyroidectomy Fractures: None Treatments: Multivitamin ASSESSMENT: The BMD measured at AP Spine L1-L4 is 1.072 g/cm2 with a T-score of -0.9. This patient is considered normal according to [facility]) criteria. The quality of the exam is good. Site Region Measured Date Measured Age YA BMD Significant CHANGE T-score AP Spine  L1-L4      07[age]10/2021    65.4         -0.9    1.072 g/cm2 DualFemur Neck Right 07/10/2021    65.4         0.1     1.049 g/cm2 DualFemur Total Mean 07[age]10/2021    65.4         0.8     1.104 g/cm2 World Health Organization [facility]) criteria for post-menopausal, Caucasian Women: Normal       T-score at or above -1 SD Osteopenia   T-score between -1 and -2.5 SD Osteoporosis T-score at or below -2.5 SD RECOMMENDATION: 1. All patients should optimize calcium and vitamin D intake. 2. Consider FDA-approved medical therapies in postmenopausal women and men aged 88 years and older, based on the following: a. A hip or vertebral (clinical or morphometric) fracture. b. T-score = -2.5 at the femoral neck or spine after appropriate evaluation to exclude secondary causes. c. Low bone  mass (T-score between -1.0 and -2.5 at the femoral neck or spine) and a 10-year probability of a hip  fracture = 3% or a 10-year probability of a major osteoporosis-related fracture = 20% based on the [place]-adapted WHO algorithm. d. Clinician judgment and/or patient preferences may indicate treatment for people with 10-year fracture probabilities above or below these levels. FOLLOW-UP: Patients with diagnosis of osteoporosis or at high risk for fracture should have regular bone mineral density tests.? Patients eligible for Medicare are allowed routine testing every 2 years.? The testing frequency can be increased to one year for patients who have rapidly progressing disease, are receiving or discontinuing medical therapy to restore bone mass, or have additional risk factors. I have reviewed this study and agree with the findings. Mark A. Thornton Papas, M.D. [facility] Electronically Signed   By: Lavonia Dana M.D.   On: 07/10/2021 15:27   MM 3D SCREEN BREAST BILATERAL  Result Date: 07/11/2021 CLINICAL DATA:  Screening. EXAM: DIGITAL SCREENING BILATERAL MAMMOGRAM WITH TOMOSYNTHESIS AND CAD TECHNIQUE: Bilateral screening digital craniocaudal and mediolateral oblique mammograms were obtained. Bilateral screening digital breast tomosynthesis was performed. The images were evaluated with computer-aided detection. COMPARISON:  Previous exam(s). ACR Breast Density Category b: There are scattered areas of fibroglandular density. FINDINGS: There are no findings suspicious for malignancy. IMPRESSION: No mammographic evidence of malignancy. A result letter of this screening mammogram will be mailed directly to the patient. RECOMMENDATION: Screening mammogram in one year. (Code:SM-B-01Y) BI-RADS CATEGORY  1: Negative. Electronically Signed   By: Lillia Mountain M.D.   On: 07/11/2021 13:41    Assessment/Plan 1. Essential hypertension B/p has improved from last visit. Continue on Amlodipine and Maxzide  - Dietary modification and exercise at least 3 times per week for 30 minutes advised. - DASH Eating plan discussed 2.  Dizziness Has improved.  3. Class 2 severe obesity due to excess calories with serious comorbidity and body mass index (BMI) of 38.0 to 38.9 in adult (HCC) BMI 39.37  - Dietary modification and exercise at least 3 times per week for 30 minutes advised.   Family/ staff Communication: Reviewed plan of care with patient verbalized understanding  Labs/tests ordered: None   Next Appointment: Has appointment with PCP   Sandrea Hughs, NP

## 2021-08-13 ENCOUNTER — Ambulatory Visit: Payer: Medicare Other | Admitting: Nurse Practitioner

## 2021-08-18 ENCOUNTER — Other Ambulatory Visit: Payer: Self-pay

## 2021-08-18 ENCOUNTER — Encounter: Payer: Self-pay | Admitting: Nurse Practitioner

## 2021-08-18 ENCOUNTER — Ambulatory Visit (INDEPENDENT_AMBULATORY_CARE_PROVIDER_SITE_OTHER): Payer: Medicare PPO | Admitting: Nurse Practitioner

## 2021-08-18 VITALS — BP 118/76 | HR 92 | Temp 97.3°F | Ht 67.5 in | Wt 250.0 lb

## 2021-08-18 DIAGNOSIS — Z0000 Encounter for general adult medical examination without abnormal findings: Secondary | ICD-10-CM

## 2021-08-18 DIAGNOSIS — Z23 Encounter for immunization: Secondary | ICD-10-CM | POA: Diagnosis not present

## 2021-08-18 NOTE — Patient Instructions (Signed)
Michelle Campos , Thank you for taking time to come for your Medicare Wellness Visit. I appreciate your ongoing commitment to your health goals. Please review the following plan we discussed and let me know if I can assist you in the future.   Screening recommendations/referrals: Colonoscopy up to date Mammogram up to date Bone Density up to date Recommended yearly ophthalmology/optometry visit for glaucoma screening and checkup Recommended yearly dental visit for hygiene and checkup  Vaccinations: Influenza vaccine Due at this time Pneumococcal vaccine - to get 2nd Vaccine feb 2023. Tdap vaccine Due at this time- can get at local pharmacy Shingles vaccine Due at this time- can get at your local pharmacy COVID- booster recommended- can get at local pharmacy    Advanced directives: recommend to complete and bring back to office to place on file.   Conditions/risks identified: obesity, advanced age (65)  Next appointment: 1 year for AWV   Preventive Care 63 Years and Older, Female Preventive care refers to lifestyle choices and visits with your health care provider that can promote health and wellness. What does preventive care include? A yearly physical exam. This is also called an annual well check. Dental exams once or twice a year. Routine eye exams. Ask your health care provider how often you should have your eyes checked. Personal lifestyle choices, including: Daily care of your teeth and gums. Regular physical activity. Eating a healthy diet. Avoiding tobacco and drug use. Limiting alcohol use. Practicing safe sex. Taking low-dose aspirin every day. Taking vitamin and mineral supplements as recommended by your health care provider. What happens during an annual well check? The services and screenings done by your health care provider during your annual well check will depend on your age, overall health, lifestyle risk factors, and family history of disease. Counseling   Your health care provider may ask you questions about your: Alcohol use. Tobacco use. Drug use. Emotional well-being. Home and relationship well-being. Sexual activity. Eating habits. History of falls. Memory and ability to understand (cognition). Work and work Statistician. Reproductive health. Screening  You may have the following tests or measurements: Height, weight, and BMI. Blood pressure. Lipid and cholesterol levels. These may be checked every 5 years, or more frequently if you are over 71 years old. Skin check. Lung cancer screening. You may have this screening every year starting at age 74 if you have a 30-pack-year history of smoking and currently smoke or have quit within the past 15 years. Fecal occult blood test (FOBT) of the stool. You may have this test every year starting at age 52. Flexible sigmoidoscopy or colonoscopy. You may have a sigmoidoscopy every 5 years or a colonoscopy every 10 years starting at age 81. Hepatitis C blood test. Hepatitis B blood test. Sexually transmitted disease (STD) testing. Diabetes screening. This is done by checking your blood sugar (glucose) after you have not eaten for a while (fasting). You may have this done every 1-3 years. Bone density scan. This is done to screen for osteoporosis. You may have this done starting at age 18. Mammogram. This may be done every 1-2 years. Talk to your health care provider about how often you should have regular mammograms. Talk with your health care provider about your test results, treatment options, and if necessary, the need for more tests. Vaccines  Your health care provider may recommend certain vaccines, such as: Influenza vaccine. This is recommended every year. Tetanus, diphtheria, and acellular pertussis (Tdap, Td) vaccine. You may need a Td booster  every 10 years. Zoster vaccine. You may need this after age 64. Pneumococcal 13-valent conjugate (PCV13) vaccine. One dose is recommended  after age 54. Pneumococcal polysaccharide (PPSV23) vaccine. One dose is recommended after age 68. Talk to your health care provider about which screenings and vaccines you need and how often you need them. This information is not intended to replace advice given to you by your health care provider. Make sure you discuss any questions you have with your health care provider. Document Released: 12/13/2015 Document Revised: 08/05/2016 Document Reviewed: 09/17/2015 Elsevier Interactive Patient Education  2017 [employer] Prevention in the Home Falls can cause injuries. They can happen to people of all ages. There are many things you can do to make your home safe and to help prevent falls. What can I do on the outside of my home? Regularly fix the edges of walkways and driveways and fix any cracks. Remove anything that might make you trip as you walk through a door, such as a raised step or threshold. Trim any bushes or trees on the path to your home. Use bright outdoor lighting. Clear any walking paths of anything that might make someone trip, such as rocks or tools. Regularly check to see if handrails are loose or broken. Make sure that both sides of any steps have handrails. Any raised decks and porches should have guardrails on the edges. Have any leaves, snow, or ice cleared regularly. Use sand or salt on walking paths during winter. Clean up any spills in your garage right away. This includes oil or grease spills. What can I do in the bathroom? Use night lights. Install grab bars by the toilet and in the tub and shower. Do not use towel bars as grab bars. Use non-skid mats or decals in the tub or shower. If you need to sit down in the shower, use a plastic, non-slip stool. Keep the floor dry. Clean up any water that spills on the floor as soon as it happens. Remove soap buildup in the tub or shower regularly. Attach bath mats securely with double-sided non-slip rug tape. Do not  have throw rugs and other things on the floor that can make you trip. What can I do in the bedroom? Use night lights. Make sure that you have a light by your bed that is easy to reach. Do not use any sheets or blankets that are too big for your bed. They should not hang down onto the floor. Have a firm chair that has side arms. You can use this for support while you get dressed. Do not have throw rugs and other things on the floor that can make you trip. What can I do in the kitchen? Clean up any spills right away. Avoid walking on wet floors. Keep items that you use a lot in easy-to-reach places. If you need to reach something above you, use a strong step stool that has a grab bar. Keep electrical cords out of the way. Do not use floor polish or wax that makes floors slippery. If you must use wax, use non-skid floor wax. Do not have throw rugs and other things on the floor that can make you trip. What can I do with my stairs? Do not leave any items on the stairs. Make sure that there are handrails on both sides of the stairs and use them. Fix handrails that are broken or loose. Make sure that handrails are as long as the stairways. Check any carpeting to  make sure that it is firmly attached to the stairs. Fix any carpet that is loose or worn. Avoid having throw rugs at the top or bottom of the stairs. If you do have throw rugs, attach them to the floor with carpet tape. Make sure that you have a light switch at the top of the stairs and the bottom of the stairs. If you do not have them, ask someone to add them for you. What else can I do to help prevent falls? Wear shoes that: Do not have high heels. Have rubber bottoms. Are comfortable and fit you well. Are closed at the toe. Do not wear sandals. If you use a stepladder: Make sure that it is fully opened. Do not climb a closed stepladder. Make sure that both sides of the stepladder are locked into place. Ask someone to hold it for  you, if possible. Clearly mark and make sure that you can see: Any grab bars or handrails. First and last steps. Where the edge of each step is. Use tools that help you move around (mobility aids) if they are needed. These include: Canes. Walkers. Scooters. Crutches. Turn on the lights when you go into a dark area. Replace any light bulbs as soon as they burn out. Set up your furniture so you have a clear path. Avoid moving your furniture around. If any of your floors are uneven, fix them. If there are any pets around you, be aware of where they are. Review your medicines with your doctor. Some medicines can make you feel dizzy. This can increase your chance of falling. Ask your doctor what other things that you can do to help prevent falls. This information is not intended to replace advice given to you by your health care provider. Make sure you discuss any questions you have with your health care provider. Document Released: 09/12/2009 Document Revised: 04/23/2016 Document Reviewed: 12/21/2014 Elsevier Interactive Patient Education  2017 [employer].

## 2021-08-18 NOTE — Progress Notes (Signed)
Subjective:    Michelle Campos is a 65 y.o. female who presents for a Welcome to Medicare exam.   Review of Systems         Objective:    Today's Vitals   08/18/21 0843  BP: 118/76  Pulse: 92  Temp: (!) 97.3 F (36.3 C)  SpO2: 98%  Weight: 250 lb (113.4 kg)  Height: 5' 7.5" (1.715 m)  Body mass index is 38.58 kg/m.  Medications Outpatient Encounter Medications as of 08/18/2021  Medication Sig   amLODipine (NORVASC) 10 MG tablet TAKE 1 TABLET DAILY   meclizine (ANTIVERT) 12.5 MG tablet Take 1 tablet (12.5 mg total) by mouth 3 (three) times daily as needed for dizziness.   Misc Natural Products (OSTEO BI-FLEX JOINT SHIELD PO) Take 1 tablet by mouth daily.   Multiple Vitamins-Minerals (MULTIVITAMIN ADULTS 50+ PO) Take by mouth daily.   St Johns Wort 300 MG CAPS Take 1 capsule by mouth daily.   triamterene-hydrochlorothiazide (MAXZIDE-25) 37.5-25 MG tablet TAKE 1 TABLET DAILY   No facility-administered encounter medications on file as of 08/18/2021.     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[place])    Superficial fungus infection of skin    on chest   Thyroid cancer ([place]) 2016   Thyroid mass    "2"   Past Surgical History:  Procedure Laterality Date   CESAREAN SECTION  1997   IRRIGATION AND DEBRIDEMENT SEBACEOUS CYST  ~ 2010   on back   PARATHYROIDECTOMY Right 05/27/2015   PARATHYROIDECTOMY Right 05/27/2015   Procedure: RIGHT PARATHYROIDECTOMY WITH FROZEN SECTION;  Surgeon: Izora Gala, MD;  Location: [facility];  Service: ENT;  Laterality: Right;   THYROID LOBECTOMY Right 05/27/2015   THYROIDECTOMY Right 05/27/2015    Procedure: RIGHT THYROID LOBECTOMY WITH FROZEN SECTION;  Surgeon: Izora Gala, MD;  Location: [facility] OR;  Service: ENT;  Laterality: Right;   TUBAL LIGATION  1997    Family History  Problem Relation Age of Onset   Congestive Heart Failure Mother    Diabetes Mother 77   Hypertension Mother    Congestive Heart Failure Father    Multiple myeloma Brother 13   Diabetes Sister 40   Hypertension Sister    Hypertension Brother    Hypertension Brother    Hyperlipidemia Brother    Hypertension Brother    Hyperlipidemia Brother    Social History   Occupational History   Not on file  Tobacco Use   Smoking status: Former    Packs/day: 0.25    Years: 4.00    Pack years: 1.00    Types: Cigarettes   Smokeless tobacco: Never   Tobacco comments:    quit smoking cigarettes in the 1970's  Vaping Use   Vaping Use: Never used  Substance and Sexual Activity   Alcohol use: Yes    Comment: maybe once or twice a year   Drug use: No   Sexual activity: Yes    Tobacco Counseling Counseling given: Not Answered Tobacco comments: quit smoking cigarettes in the 1970's   Immunizations and Health Maintenance Immunization History  Administered  Date(s) Administered   Influenza, Quadrivalent, Recombinant, Inj, Pf 08/24/2018   Influenza,inj,Quad PF,6+ Mos 07/25/2019, 07/19/2020   Moderna Sars-Covid-2 Vaccination 11/28/2020   PFIZER(Purple Top)SARS-COV-2 Vaccination 01/26/2020, 02/17/2020   PPD Test 08/07/2019   Pneumococcal Conjugate-13 01/24/2021   Health Maintenance Due  Topic Date Due   TETANUS/TDAP  Never done   Zoster Vaccines- Shingrix (1 of 2) Never done   PAP SMEAR-Modifier  01/14/2021   COVID-19 Vaccine (4 - Booster) 02/20/2021   INFLUENZA VACCINE  06/30/2021    Activities of Daily Living No flowsheet data found.   Advanced Directives: Does Patient Have a Medical Advance Directive?: No Would patient like information on creating a medical advance directive?: Yes  (MAU/Ambulatory/Procedural Areas - Information given)    Assessment:    This is a routine wellness examination for this patient .  Vision/Hearing screen Hearing Screening - Comments:: No hearing issues  Vision Screening - Comments:: Last eye exam less than 12 months ago. Doctors name unknown, patient was not able to recall.   Dietary issues and exercise activities discussed:      Goals   None    Depression Screen PHQ 2/9 Scores 08/18/2021 06/03/2021 01/17/2020 10/06/2018  PHQ - 2 Score 0 0 0 0     Fall Risk Fall Risk  08/18/2021  Falls in the past year? 0  Number falls in past yr: 0  Injury with Fall? 0  Risk for fall due to : No Fall Risks  Follow up Falls evaluation completed    Cognitive Function:     6CIT Screen 06/03/2021  What Year? 0 points  What month? 0 points  What time? 0 points  Count back from 20 0 points  Months in reverse 0 points  Repeat phrase 2 points  Total Score 2    Patient Care Team: Lauree Chandler, NP as PCP - General (Geriatric Medicine)     Plan:     I have personally reviewed and noted the following in the patient's chart:   Medical and social history Use of alcohol, tobacco or illicit drugs  Current medications and supplements Functional ability and status Nutritional status Physical activity Advanced directives List of other physicians Hospitalizations, surgeries, and ER visits in previous 12 months Vitals Screenings to include cognitive, depression, and falls Referrals and appointments  In addition, I have reviewed and discussed with patient certain preventive protocols, quality metrics, and best practice recommendations. A written personalized care plan for preventive services as well as general preventive health recommendations were provided to patient.     Lauree Chandler, NP 08/18/2021

## 2021-12-18 DIAGNOSIS — H2513 Age-related nuclear cataract, bilateral: Secondary | ICD-10-CM | POA: Diagnosis not present

## 2022-01-08 DIAGNOSIS — H25811 Combined forms of age-related cataract, right eye: Secondary | ICD-10-CM | POA: Diagnosis not present

## 2022-01-15 ENCOUNTER — Other Ambulatory Visit: Payer: Medicare PPO

## 2022-01-19 ENCOUNTER — Ambulatory Visit: Payer: Medicare PPO | Admitting: Nurse Practitioner

## 2022-01-19 DIAGNOSIS — H25811 Combined forms of age-related cataract, right eye: Secondary | ICD-10-CM | POA: Diagnosis not present

## 2022-01-30 DIAGNOSIS — Z961 Presence of intraocular lens: Secondary | ICD-10-CM | POA: Diagnosis not present

## 2022-01-30 DIAGNOSIS — H25812 Combined forms of age-related cataract, left eye: Secondary | ICD-10-CM | POA: Diagnosis not present

## 2022-01-30 DIAGNOSIS — H2513 Age-related nuclear cataract, bilateral: Secondary | ICD-10-CM | POA: Diagnosis not present

## 2022-03-23 ENCOUNTER — Other Ambulatory Visit: Payer: Self-pay | Admitting: Nurse Practitioner

## 2022-03-23 DIAGNOSIS — I10 Essential (primary) hypertension: Secondary | ICD-10-CM

## 2022-04-14 ENCOUNTER — Telehealth: Payer: Self-pay

## 2022-04-14 DIAGNOSIS — Z1211 Encounter for screening for malignant neoplasm of colon: Secondary | ICD-10-CM

## 2022-04-14 NOTE — Telephone Encounter (Signed)
Spoke with patient she agreed to do the Cologuard again attaching orders ?

## 2022-04-28 DIAGNOSIS — Z1211 Encounter for screening for malignant neoplasm of colon: Secondary | ICD-10-CM | POA: Diagnosis not present

## 2022-05-06 LAB — COLOGUARD: COLOGUARD: NEGATIVE

## 2022-08-12 ENCOUNTER — Other Ambulatory Visit: Payer: Self-pay | Admitting: Nurse Practitioner

## 2022-08-13 NOTE — Telephone Encounter (Signed)
Has upcoming appointment

## 2022-08-24 ENCOUNTER — Encounter: Payer: Self-pay | Admitting: Nurse Practitioner

## 2022-08-24 ENCOUNTER — Ambulatory Visit (INDEPENDENT_AMBULATORY_CARE_PROVIDER_SITE_OTHER): Payer: Medicare PPO | Admitting: Nurse Practitioner

## 2022-08-24 DIAGNOSIS — Z0000 Encounter for general adult medical examination without abnormal findings: Secondary | ICD-10-CM | POA: Diagnosis not present

## 2022-08-24 NOTE — Patient Instructions (Signed)
Michelle Campos , Thank you for taking time to come for your Medicare Wellness Visit. I appreciate your ongoing commitment to your health goals. Please review the following plan we discussed and let me know if I can assist you in the future.   Screening recommendations/referrals: Colonoscopy up to date- cologuard Mammogram recommend yearly Bone Density up to date Recommended yearly ophthalmology/optometry visit for glaucoma screening and checkup Recommended yearly dental visit for hygiene and checkup  Vaccinations: Influenza vaccine- due annually in September/October Pneumococcal vaccine DUE for 2nd pneumonia vaccine  Tdap vaccine DUE- recommend to get at your local pharmacy Shingles vaccine DUE- recommend to get at your local pharmacy    Advanced directives: encouraged to complete  Conditions/risks identified: advanced age, hypertension, obesity  Next appointment: yearly- in person next year.    Preventive Care 66 Years and Older, Female Preventive care refers to lifestyle choices and visits with your health care provider that can promote health and wellness. What does preventive care include? A yearly physical exam. This is also called an annual well check. Dental exams once or twice a year. Routine eye exams. Ask your health care provider how often you should have your eyes checked. Personal lifestyle choices, including: Daily care of your teeth and gums. Regular physical activity. Eating a healthy diet. Avoiding tobacco and drug use. Limiting alcohol use. Practicing safe sex. Taking low-dose aspirin every day. Taking vitamin and mineral supplements as recommended by your health care provider. What happens during an annual well check? The services and screenings done by your health care provider during your annual well check will depend on your age, overall health, lifestyle risk factors, and family history of disease. Counseling  Your health care provider may ask you  questions about your: Alcohol use. Tobacco use. Drug use. Emotional well-being. Home and relationship well-being. Sexual activity. Eating habits. History of falls. Memory and ability to understand (cognition). Work and work Statistician. Reproductive health. Screening  You may have the following tests or measurements: Height, weight, and BMI. Blood pressure. Lipid and cholesterol levels. These may be checked every 5 years, or more frequently if you are over 64 years old. Skin check. Lung cancer screening. You may have this screening every year starting at age 69 if you have a 30-pack-year history of smoking and currently smoke or have quit within the past 15 years. Fecal occult blood test (FOBT) of the stool. You may have this test every year starting at age 74. Flexible sigmoidoscopy or colonoscopy. You may have a sigmoidoscopy every 5 years or a colonoscopy every 10 years starting at age 7. Hepatitis C blood test. Hepatitis B blood test. Sexually transmitted disease (STD) testing. Diabetes screening. This is done by checking your blood sugar (glucose) after you have not eaten for a while (fasting). You may have this done every 1-3 years. Bone density scan. This is done to screen for osteoporosis. You may have this done starting at age 69. Mammogram. This may be done every 1-2 years. Talk to your health care provider about how often you should have regular mammograms. Talk with your health care provider about your test results, treatment options, and if necessary, the need for more tests. Vaccines  Your health care provider may recommend certain vaccines, such as: Influenza vaccine. This is recommended every year. Tetanus, diphtheria, and acellular pertussis (Tdap, Td) vaccine. You may need a Td booster every 10 years. Zoster vaccine. You may need this after age 9. Pneumococcal 13-valent conjugate (PCV13) vaccine. One dose is  recommended after age 27. Pneumococcal polysaccharide  (PPSV23) vaccine. One dose is recommended after age 24. Talk to your health care provider about which screenings and vaccines you need and how often you need them. This information is not intended to replace advice given to you by your health care provider. Make sure you discuss any questions you have with your health care provider. Document Released: 12/13/2015 Document Revised: 08/05/2016 Document Reviewed: 09/17/2015 Elsevier Interactive Patient Education  2017 [employer] Prevention in the Home Falls can cause injuries. They can happen to people of all ages. There are many things you can do to make your home safe and to help prevent falls. What can I do on the outside of my home? Regularly fix the edges of walkways and driveways and fix any cracks. Remove anything that might make you trip as you walk through a door, such as a raised step or threshold. Trim any bushes or trees on the path to your home. Use bright outdoor lighting. Clear any walking paths of anything that might make someone trip, such as rocks or tools. Regularly check to see if handrails are loose or broken. Make sure that both sides of any steps have handrails. Any raised decks and porches should have guardrails on the edges. Have any leaves, snow, or ice cleared regularly. Use sand or salt on walking paths during winter. Clean up any spills in your garage right away. This includes oil or grease spills. What can I do in the bathroom? Use night lights. Install grab bars by the toilet and in the tub and shower. Do not use towel bars as grab bars. Use non-skid mats or decals in the tub or shower. If you need to sit down in the shower, use a plastic, non-slip stool. Keep the floor dry. Clean up any water that spills on the floor as soon as it happens. Remove soap buildup in the tub or shower regularly. Attach bath mats securely with double-sided non-slip rug tape. Do not have throw rugs and other things on the  floor that can make you trip. What can I do in the bedroom? Use night lights. Make sure that you have a light by your bed that is easy to reach. Do not use any sheets or blankets that are too big for your bed. They should not hang down onto the floor. Have a firm chair that has side arms. You can use this for support while you get dressed. Do not have throw rugs and other things on the floor that can make you trip. What can I do in the kitchen? Clean up any spills right away. Avoid walking on wet floors. Keep items that you use a lot in easy-to-reach places. If you need to reach something above you, use a strong step stool that has a grab bar. Keep electrical cords out of the way. Do not use floor polish or wax that makes floors slippery. If you must use wax, use non-skid floor wax. Do not have throw rugs and other things on the floor that can make you trip. What can I do with my stairs? Do not leave any items on the stairs. Make sure that there are handrails on both sides of the stairs and use them. Fix handrails that are broken or loose. Make sure that handrails are as long as the stairways. Check any carpeting to make sure that it is firmly attached to the stairs. Fix any carpet that is loose or worn. Avoid having  throw rugs at the top or bottom of the stairs. If you do have throw rugs, attach them to the floor with carpet tape. Make sure that you have a light switch at the top of the stairs and the bottom of the stairs. If you do not have them, ask someone to add them for you. What else can I do to help prevent falls? Wear shoes that: Do not have high heels. Have rubber bottoms. Are comfortable and fit you well. Are closed at the toe. Do not wear sandals. If you use a stepladder: Make sure that it is fully opened. Do not climb a closed stepladder. Make sure that both sides of the stepladder are locked into place. Ask someone to hold it for you, if possible. Clearly mark and make  sure that you can see: Any grab bars or handrails. First and last steps. Where the edge of each step is. Use tools that help you move around (mobility aids) if they are needed. These include: Canes. Walkers. Scooters. Crutches. Turn on the lights when you go into a dark area. Replace any light bulbs as soon as they burn out. Set up your furniture so you have a clear path. Avoid moving your furniture around. If any of your floors are uneven, fix them. If there are any pets around you, be aware of where they are. Review your medicines with your doctor. Some medicines can make you feel dizzy. This can increase your chance of falling. Ask your doctor what other things that you can do to help prevent falls. This information is not intended to replace advice given to you by your health care provider. Make sure you discuss any questions you have with your health care provider. Document Released: 09/12/2009 Document Revised: 04/23/2016 Document Reviewed: 12/21/2014 Elsevier Interactive Patient Education  2017 [employer].

## 2022-08-24 NOTE — Progress Notes (Signed)
Subjective:   Michelle Campos is a 66 y.o. female who presents for Medicare Annual (Subsequent) preventive examination.  Review of Systems     Cardiac Risk Factors include: advanced age (>30men, >74 women);hypertension     Objective:    There were no vitals filed for this visit. There is no height or weight on file to calculate BMI.     08/24/2022    3:01 PM 08/18/2021    8:42 AM 08/07/2021    4:55 PM 07/21/2021   10:07 AM 07/16/2021    8:37 AM 06/03/2021    9:31 AM 01/24/2021    8:41 AM  Advanced Directives  Does Patient Have a Medical Advance Directive? No No No No No No No  Does patient want to make changes to medical advance directive? No - Patient declined        Would patient like information on creating a medical advance directive?  Yes (MAU/Ambulatory/Procedural Areas - Information given) No - Patient declined No - Patient declined Yes (MAU/Ambulatory/Procedural Areas - Information given)  Yes (MAU/Ambulatory/Procedural Areas - Information given)    Current Medications (verified) Outpatient Encounter Medications as of 08/24/2022  Medication Sig   amLODipine (NORVASC) 10 MG tablet TAKE 1 TABLET DAILY   meclizine (ANTIVERT) 12.5 MG tablet Take 1 tablet (12.5 mg total) by mouth 3 (three) times daily as needed for dizziness.   Misc Natural Products (OSTEO BI-FLEX JOINT SHIELD PO) Take 1 tablet by mouth daily.   Multiple Vitamins-Minerals (MULTIVITAMIN ADULTS 50+ PO) Take by mouth daily.   St Johns Wort 300 MG CAPS Take 1 capsule by mouth daily.   triamterene-hydrochlorothiazide (MAXZIDE-25) 37.5-25 MG tablet TAKE 1 TABLET DAILY   No facility-administered encounter medications on file as of 08/24/2022.    Allergies (verified) Skin adhesives [cyanoacrylate]   History: Past Medical History:  Diagnosis Date   Arthritis    "right knee" (05/27/2015)   Family history of adverse reaction to anesthesia    "it's hard to bring my father back; more than once too" (05/27/2015)    GERD (gastroesophageal reflux disease)    Hypercalcemia    parathyroid gland removed   Hypertension    controlled with medication   Obesity (BMI 30-39.9)    Parathyroid cyst (HCC)    Superficial fungus infection of skin    on chest   Thyroid cancer (HCC) 2016   Thyroid mass    "2"   Past Surgical History:  Procedure Laterality Date   CESAREAN SECTION  1997   IRRIGATION AND DEBRIDEMENT SEBACEOUS CYST  ~ 2010   on back   PARATHYROIDECTOMY Right 05/27/2015   PARATHYROIDECTOMY Right 05/27/2015   Procedure: RIGHT PARATHYROIDECTOMY WITH FROZEN SECTION;  Surgeon: Serena Colonel, MD;  Location: [facility] OR;  Service: ENT;  Laterality: Right;   THYROID LOBECTOMY Right 05/27/2015   THYROIDECTOMY Right 05/27/2015   Procedure: RIGHT THYROID LOBECTOMY WITH FROZEN SECTION;  Surgeon: Serena Colonel, MD;  Location: [facility] OR;  Service: ENT;  Laterality: Right;   TUBAL LIGATION  1997   Family History  Problem Relation Age of Onset   Congestive Heart Failure Mother    Diabetes Mother 90   Hypertension Mother    Congestive Heart Failure Father    Multiple myeloma Brother 45   Diabetes Sister 30   Hypertension Sister    Hypertension Brother    Hypertension Brother    Hyperlipidemia Brother    Hypertension Brother    Hyperlipidemia Brother    Social History   Socioeconomic History  Marital status: Divorced    Spouse name: Not on file   Number of children: Not on file   Years of education: Not on file   Highest education level: Not on file  Occupational History   Not on file  Tobacco Use   Smoking status: Former    Packs/day: 0.25    Years: 4.00    Total pack years: 1.00    Types: Cigarettes   Smokeless tobacco: Never   Tobacco comments:    quit smoking cigarettes in the 1970's  Vaping Use   Vaping Use: Never used  Substance and Sexual Activity   Alcohol use: Yes    Comment: maybe once or twice a year   Drug use: No   Sexual activity: Yes  Other Topics Concern   Not on file  Social  History Narrative   Social History      Diet?       Do you drink/eat things with caffeine? Yes, soft drinks, coffee (occasionally)      Marital status?           divorced                         What year were you married? 1992      Do you live in a house, apartment, assisted living, condo, trailer, etc.? house      Is it one or more stories? 2 stories      How many persons live in your home? 2      Do you have any pets in your home? (please list) no      Highest level of education completed? Some graduate studies      Current or past profession: Web designer      Do you exercise?        yes                              Type & how often? 3 x week- bike, treadmill      Advanced Directives      Do you have a living will? no      Do you have a DNR form?    no                              If not, do you want to discuss one? no      Do you have signed POA/HPOA for forms? no      Functional Status      Do you have difficulty bathing or dressing yourself? no      Do you have difficulty preparing food or eating? no      Do you have difficulty managing your medications? no      Do you have difficulty managing your finances?  no      Do you have difficulty affording your medications?   Social Determinants of Health   Financial Resource Strain: Not on file  Food Insecurity: Not on file  Transportation Needs: Not on file  Physical Activity: Not on file  Stress: Not on file  Social Connections: Not on file    Tobacco Counseling Counseling given: Not Answered Tobacco comments: quit smoking cigarettes in the 1970's   Clinical Intake:  Pre-visit preparation completed: Yes  Pain : No/denies pain     BMI - recorded: 39.37 Nutritional Status:  BMI > 30  Obese Nutritional Risks: None Diabetes: No  How often do you need to have someone help you when you read instructions, pamphlets, or other written materials from your doctor or pharmacy?: 1 - Never What  is the last grade level you completed in school?: [place] entered by :: Porsha McClurkin,CMA   Activities of Daily Living    08/24/2022    3:03 PM  In your present state of health, do you have any difficulty performing the following activities:  Hearing? 0  Vision? 0  Difficulty concentrating or making decisions? 0  Walking or climbing stairs? 0  Dressing or bathing? 0  Doing errands, shopping? 0  Preparing Food and eating ? N  Using the Toilet? N  In the past six months, have you accidently leaked urine? N  Do you have problems with loss of bowel control? N  Managing your Medications? N  Managing your Finances? N  Housekeeping or managing your Housekeeping? N    Patient Care Team: Lauree Chandler, NP as PCP - General (Geriatric Medicine)  Indicate any recent Medical Services you may have received from other than [facility] providers in the past year (date may be approximate).     Assessment:   This is a routine wellness examination for Raejean.  Hearing/Vision screen Hearing Screening - Comments:: No concerns  Vision Screening - Comments:: No concerns/ reading readers only  Dietary issues and exercise activities discussed: Current Exercise Habits: Structured exercise class, Type of exercise: strength training/weights;treadmill;walking, Time (Minutes): 60, Frequency (Times/Week): 4, Weekly Exercise (Minutes/Week): 240, Intensity: Mild, Exercise limited by: None identified   Goals Addressed   None    Depression Screen    08/24/2022    3:03 PM 08/18/2021    8:40 AM 06/03/2021    9:28 AM 01/17/2020    9:40 AM 10/06/2018    8:55 AM  PHQ 2/9 Scores  PHQ - 2 Score 0 0 0 0 0    Fall Risk    08/24/2022    3:02 PM 08/18/2021    8:40 AM 08/07/2021    4:54 PM 07/21/2021   10:07 AM 06/03/2021    9:29 AM  Fall Risk   Falls in the past year? 0 0 0 0 0  Number falls in past yr: 0 0 0 0 0  Injury with Fall? 0 0 0 0 0  Risk for fall due to : No Fall  Risks No Fall Risks No Fall Risks No Fall Risks No Fall Risks  Follow up  Falls evaluation completed Falls evaluation completed Falls evaluation completed Falls evaluation completed    FALL RISK PREVENTION PERTAINING TO THE HOME:  Any stairs in or around the home? Yes  If so, are there any without handrails? No  Home free of loose throw rugs in walkways, pet beds, electrical cords, etc? Yes  Adequate lighting in your home to reduce risk of falls? Yes   ASSISTIVE DEVICES UTILIZED TO PREVENT FALLS:  Life alert? No  Use of a cane, walker or w/c? No  Grab bars in the bathroom? Yes  Shower chair or bench in shower? No  Elevated toilet seat or a handicapped toilet? Yes   TIMED UP AND GO:  Was the test performed? No .   Cognitive Function:        08/24/2022    3:05 PM 06/03/2021    9:30 AM  6CIT Screen  What Year? 0 points 0 points  What  month? 0 points 0 points  What time? 0 points 0 points  Count back from 20 0 points 0 points  Months in reverse 0 points 0 points  Repeat phrase 0 points 2 points  Total Score 0 points 2 points    Immunizations Immunization History  Administered Date(s) Administered   Fluad Quad(high Dose 65+) 08/18/2021   Influenza, Quadrivalent, Recombinant, Inj, Pf 08/24/2018   Influenza,inj,Quad PF,6+ Mos 07/25/2019, 07/19/2020   Moderna Sars-Covid-2 Vaccination 11/28/2020   PFIZER(Purple Top)SARS-COV-2 Vaccination 01/26/2020, 02/17/2020   PPD Test 08/07/2019   Pneumococcal Conjugate-13 01/24/2021    TDAP status: Due, Education has been provided regarding the importance of this vaccine. Advised may receive this vaccine at local pharmacy or Health Dept. Aware to provide a copy of the vaccination record if obtained from local pharmacy or Health Dept. Verbalized acceptance and understanding.  Flu Vaccine status: Due, Education has been provided regarding the importance of this vaccine. Advised may receive this vaccine at local pharmacy or Health Dept.  Aware to provide a copy of the vaccination record if obtained from local pharmacy or Health Dept. Verbalized acceptance and understanding.  Pneumococcal vaccine status: Due, Education has been provided regarding the importance of this vaccine. Advised may receive this vaccine at local pharmacy or Health Dept. Aware to provide a copy of the vaccination record if obtained from local pharmacy or Health Dept. Verbalized acceptance and understanding.  Covid-19 vaccine status: Information provided on how to obtain vaccines.   Qualifies for Shingles Vaccine? Yes   Zostavax completed No   Shingrix Completed?: No.    Education has been provided regarding the importance of this vaccine. Patient has been advised to call insurance company to determine out of pocket expense if they have not yet received this vaccine. Advised may also receive vaccine at local pharmacy or Health Dept. Verbalized acceptance and understanding.  Screening Tests Health Maintenance  Topic Date Due   TETANUS/TDAP  Never done   Zoster Vaccines- Shingrix (1 of 2) Never done   COVID-19 Vaccine (4 - Mixed Product risk series) 01/23/2021   Pneumonia Vaccine 56+ Years old (2 - PPSV23 or PCV20) 01/24/2022   INFLUENZA VACCINE  06/30/2022   MAMMOGRAM  07/11/2023   Fecal DNA (Cologuard)  04/28/2025   DEXA SCAN  Completed   Hepatitis C Screening  Completed   HPV VACCINES  Aged Out    Health Maintenance  Health Maintenance Due  Topic Date Due   TETANUS/TDAP  Never done   Zoster Vaccines- Shingrix (1 of 2) Never done   COVID-19 Vaccine (4 - Mixed Product risk series) 01/23/2021   Pneumonia Vaccine 91+ Years old (2 - PPSV23 or PCV20) 01/24/2022   INFLUENZA VACCINE  06/30/2022    Colorectal cancer screening: Type of screening: Cologuard. Completed 03/2022. Repeat every 3 years  Mammogram status: Completed 07/10/21. Repeat every year  Bone Density status: Completed 07/10/22. Results reflect: Bone density results: NORMAL. Repeat  every 2 years.  Lung Cancer Screening: (Low Dose CT Chest recommended if Age 15-80 years, 30 pack-year currently smoking OR have quit w/in 15years.) does not qualify.   Lung Cancer Screening Referral: na  Additional Screening:  Hepatitis C Screening: does qualify; Completed 2021  Vision Screening: Recommended annual ophthalmology exams for early detection of glaucoma and other disorders of the eye. Is the patient up to date with their annual eye exam?  Yes  Who is the provider or what is the name of the office in which the patient attends annual eye  exams? [facility] If pt is not established with a provider, would they like to be referred to a provider to establish care? No .   Dental Screening: Recommended annual dental exams for proper oral hygiene  Community Resource Referral / Chronic Care Management: CRR required this visit?  No   CCM required this visit?  No      Plan:     I have personally reviewed and noted the following in the patient's chart:   Medical and social history Use of alcohol, tobacco or illicit drugs  Current medications and supplements including opioid prescriptions. Patient is not currently taking opioid prescriptions. Functional ability and status Nutritional status Physical activity Advanced directives List of other physicians Hospitalizations, surgeries, and ER visits in previous 12 months Vitals Screenings to include cognitive, depression, and falls Referrals and appointments  In addition, I have reviewed and discussed with patient certain preventive protocols, quality metrics, and best practice recommendations. A written personalized care plan for preventive services as well as general preventive health recommendations were provided to patient.     Lauree Chandler, NP   08/24/2022    Virtual Visit via Telephone Note  I connected with patient 08/24/22 at  3:20 PM EDT by telephone and verified that I am speaking with the correct  person using two identifiers.  Location: Patient: home Provider: [place]   I discussed the limitations, risks, security and privacy concerns of performing an evaluation and management service by telephone and the availability of in person appointments. I also discussed with the patient that there may be a patient responsible charge related to this service. The patient expressed understanding and agreed to proceed.   I discussed the assessment and treatment plan with the patient. The patient was provided an opportunity to ask questions and all were answered. The patient agreed with the plan and demonstrated an understanding of the instructions.   The patient was advised to call back or seek an in-person evaluation if the symptoms worsen or if the condition fails to improve as anticipated.  I provided 15 minutes of non-face-to-face time during this encounter.  Carlos American. Harle Battiest Avs printed and mailed

## 2022-08-24 NOTE — Progress Notes (Deleted)
Subjective:   Michelle Campos is a 66 y.o. female who presents for Medicare Annual (Subsequent) preventive examination.  Review of Systems           Objective:    There were no vitals filed for this visit. There is no height or weight on file to calculate BMI.     08/18/2021    8:42 AM 08/07/2021    4:55 PM 07/21/2021   10:07 AM 07/16/2021    8:37 AM 06/03/2021    9:31 AM 01/24/2021    8:41 AM 01/17/2020    9:41 AM  Advanced Directives  Does Patient Have a Medical Advance Directive? No No No No No No No  Does patient want to make changes to medical advance directive?       Yes (MAU/Ambulatory/Procedural Areas - Information given)  Would patient like information on creating a medical advance directive? Yes (MAU/Ambulatory/Procedural Areas - Information given) No - Patient declined No - Patient declined Yes (MAU/Ambulatory/Procedural Areas - Information given)  Yes (MAU/Ambulatory/Procedural Areas - Information given)     Current Medications (verified) Outpatient Encounter Medications as of 08/24/2022  Medication Sig   amLODipine (NORVASC) 10 MG tablet TAKE 1 TABLET DAILY   meclizine (ANTIVERT) 12.5 MG tablet Take 1 tablet (12.5 mg total) by mouth 3 (three) times daily as needed for dizziness.   Misc Natural Products (OSTEO BI-FLEX JOINT SHIELD PO) Take 1 tablet by mouth daily.   Multiple Vitamins-Minerals (MULTIVITAMIN ADULTS 50+ PO) Take by mouth daily.   St Johns Wort 300 MG CAPS Take 1 capsule by mouth daily.   triamterene-hydrochlorothiazide (MAXZIDE-25) 37.5-25 MG tablet TAKE 1 TABLET DAILY   No facility-administered encounter medications on file as of 08/24/2022.    Allergies (verified) Skin adhesives [cyanoacrylate]   History: Past Medical History:  Diagnosis Date   Arthritis    "right knee" (05/27/2015)   Family history of adverse reaction to anesthesia    "it's hard to bring my father back; more than once too" (05/27/2015)   GERD (gastroesophageal reflux disease)     Hypercalcemia    parathyroid gland removed   Hypertension    controlled with medication   Obesity (BMI 30-39.9)    Parathyroid cyst (HCC)    Superficial fungus infection of skin    on chest   Thyroid cancer (HCC) 2016   Thyroid mass    "2"   Past Surgical History:  Procedure Laterality Date   CESAREAN SECTION  1997   IRRIGATION AND DEBRIDEMENT SEBACEOUS CYST  ~ 2010   on back   PARATHYROIDECTOMY Right 05/27/2015   PARATHYROIDECTOMY Right 05/27/2015   Procedure: RIGHT PARATHYROIDECTOMY WITH FROZEN SECTION;  Surgeon: Serena Colonel, MD;  Location: [facility] OR;  Service: ENT;  Laterality: Right;   THYROID LOBECTOMY Right 05/27/2015   THYROIDECTOMY Right 05/27/2015   Procedure: RIGHT THYROID LOBECTOMY WITH FROZEN SECTION;  Surgeon: Serena Colonel, MD;  Location: [facility] OR;  Service: ENT;  Laterality: Right;   TUBAL LIGATION  1997   Family History  Problem Relation Age of Onset   Congestive Heart Failure Mother    Diabetes Mother 3   Hypertension Mother    Congestive Heart Failure Father    Multiple myeloma Brother 69   Diabetes Sister 30   Hypertension Sister    Hypertension Brother    Hypertension Brother    Hyperlipidemia Brother    Hypertension Brother    Hyperlipidemia Brother    Social History   Socioeconomic History   Marital status: Divorced  Spouse name: Not on file   Number of children: Not on file   Years of education: Not on file   Highest education level: Not on file  Occupational History   Not on file  Tobacco Use   Smoking status: Former    Packs/day: 0.25    Years: 4.00    Total pack years: 1.00    Types: Cigarettes   Smokeless tobacco: Never   Tobacco comments:    quit smoking cigarettes in the 1970's  Vaping Use   Vaping Use: Never used  Substance and Sexual Activity   Alcohol use: Yes    Comment: maybe once or twice a year   Drug use: No   Sexual activity: Yes  Other Topics Concern   Not on file  Social History Narrative   Social History       Diet?       Do you drink/eat things with caffeine? Yes, soft drinks, coffee (occasionally)      Marital status?           divorced                         What year were you married? 1992      Do you live in a house, apartment, assisted living, condo, trailer, etc.? house      Is it one or more stories? 2 stories      How many persons live in your home? 2      Do you have any pets in your home? (please list) no      Highest level of education completed? Some graduate studies      Current or past profession: Web designer      Do you exercise?        yes                              Type & how often? 3 x week- bike, treadmill      Advanced Directives      Do you have a living will? no      Do you have a DNR form?    no                              If not, do you want to discuss one? no      Do you have signed POA/HPOA for forms? no      Functional Status      Do you have difficulty bathing or dressing yourself? no      Do you have difficulty preparing food or eating? no      Do you have difficulty managing your medications? no      Do you have difficulty managing your finances?  no      Do you have difficulty affording your medications?   Social Determinants of Health   Financial Resource Strain: Not on file  Food Insecurity: Not on file  Transportation Needs: Not on file  Physical Activity: Not on file  Stress: Not on file  Social Connections: Not on file    Tobacco Counseling Counseling given: Not Answered Tobacco comments: quit smoking cigarettes in the 1970's   Clinical Intake:                 Diabetic?no         Activities  of Daily Living     No data to display          Patient Care Team: Lauree Chandler, NP as PCP - General (Geriatric Medicine)  Indicate any recent Medical Services you may have received from other than [facility] providers in the past year (date may be approximate).     Assessment:   This is a  routine wellness examination for Michelle Campos.  Hearing/Vision screen No results found.  Dietary issues and exercise activities discussed:     Goals Addressed   None    Depression Screen    08/18/2021    8:40 AM 06/03/2021    9:28 AM 01/17/2020    9:40 AM 10/06/2018    8:55 AM  PHQ 2/9 Scores  PHQ - 2 Score 0 0 0 0    Fall Risk    08/18/2021    8:40 AM 08/07/2021    4:54 PM 07/21/2021   10:07 AM 06/03/2021    9:29 AM 01/17/2020    9:40 AM  Fall Risk   Falls in the past year? 0 0 0 0 0  Number falls in past yr: 0 0 0 0 0  Injury with Fall? 0 0 0 0 0  Risk for fall due to : No Fall Risks No Fall Risks No Fall Risks No Fall Risks   Follow up Falls evaluation completed Falls evaluation completed Falls evaluation completed Falls evaluation completed     [place]:  Any stairs in or around the home? {YES/NO:21197} If so, are there any without handrails? {YES/NO:21197} Home free of loose throw rugs in walkways, pet beds, electrical cords, etc? {YES/NO:21197} Adequate lighting in your home to reduce risk of falls? {YES/NO:21197}  ASSISTIVE DEVICES UTILIZED TO PREVENT FALLS:  Life alert? {YES/NO:21197} Use of a cane, walker or w/c? {YES/NO:21197} Grab bars in the bathroom? {YES/NO:21197} Shower chair or bench in shower? {YES/NO:21197} Elevated toilet seat or a handicapped toilet? {YES/NO:21197}  TIMED UP AND GO:  Was the test performed? No .   Cognitive Function:        06/03/2021    9:30 AM  6CIT Screen  What Year? 0 points  What month? 0 points  What time? 0 points  Count back from 20 0 points  Months in reverse 0 points  Repeat phrase 2 points  Total Score 2 points    Immunizations Immunization History  Administered Date(s) Administered   Fluad Quad(high Dose 65+) 08/18/2021   Influenza, Quadrivalent, Recombinant, Inj, Pf 08/24/2018   Influenza,inj,Quad PF,6+ Mos 07/25/2019, 07/19/2020   Moderna Sars-Covid-2 Vaccination 11/28/2020    PFIZER(Purple Top)SARS-COV-2 Vaccination 01/26/2020, 02/17/2020   PPD Test 08/07/2019   Pneumococcal Conjugate-13 01/24/2021    TDAP status: Due, Education has been provided regarding the importance of this vaccine. Advised may receive this vaccine at local pharmacy or Health Dept. Aware to provide a copy of the vaccination record if obtained from local pharmacy or Health Dept. Verbalized acceptance and understanding.  Flu Vaccine status: Due, Education has been provided regarding the importance of this vaccine. Advised may receive this vaccine at local pharmacy or Health Dept. Aware to provide a copy of the vaccination record if obtained from local pharmacy or Health Dept. Verbalized acceptance and understanding.  Pneumococcal vaccine status: Due, Education has been provided regarding the importance of this vaccine. Advised may receive this vaccine at local pharmacy or Health Dept. Aware to provide a copy of the vaccination record if obtained from local pharmacy  or Health Dept. Verbalized acceptance and understanding.  Covid-19 vaccine status: Information provided on how to obtain vaccines.   Qualifies for Shingles Vaccine? Yes   Zostavax completed No   Shingrix Completed?: No.    Education has been provided regarding the importance of this vaccine. Patient has been advised to call insurance company to determine out of pocket expense if they have not yet received this vaccine. Advised may also receive vaccine at local pharmacy or Health Dept. Verbalized acceptance and understanding.  Screening Tests Health Maintenance  Topic Date Due   TETANUS/TDAP  Never done   Zoster Vaccines- Shingrix (1 of 2) Never done   COVID-19 Vaccine (4 - Mixed Product risk series) 01/23/2021   Pneumonia Vaccine 37+ Years old (2 - PPSV23 or PCV20) 01/24/2022   INFLUENZA VACCINE  06/30/2022   MAMMOGRAM  07/11/2023   Fecal DNA (Cologuard)  04/28/2025   DEXA SCAN  Completed   Hepatitis C Screening  Completed    HPV VACCINES  Aged Out    Health Maintenance  Health Maintenance Due  Topic Date Due   TETANUS/TDAP  Never done   Zoster Vaccines- Shingrix (1 of 2) Never done   COVID-19 Vaccine (4 - Mixed Product risk series) 01/23/2021   Pneumonia Vaccine 9+ Years old (2 - PPSV23 or PCV20) 01/24/2022   INFLUENZA VACCINE  06/30/2022    Colorectal cancer screening: Type of screening: Cologuard. Completed 03/2022. Repeat every *** years  Mammogram status: Completed 07/10/21. Repeat every year  Bone Density status: Completed 07/10/22. Results reflect: Bone density results: NORMAL. Repeat every 2 years.  Lung Cancer Screening: (Low Dose CT Chest recommended if Age 84-80 years, 30 pack-year currently smoking OR have quit w/in 15years.) does not qualify.   Lung Cancer Screening Referral: na  Additional Screening:  Hepatitis C Screening: does qualify; Completed 2021  Vision Screening: Recommended annual ophthalmology exams for early detection of glaucoma and other disorders of the eye. Is the patient up to date with their annual eye exam?  Yes  Who is the provider or what is the name of the office in which the patient attends annual eye exams? [facility] If pt is not established with a provider, would they like to be referred to a provider to establish care? No .   Dental Screening: Recommended annual dental exams for proper oral hygiene  Community Resource Referral / Chronic Care Management: CRR required this visit?  No   CCM required this visit?  No      Plan:     I have personally reviewed and noted the following in the patient's chart:   Medical and social history Use of alcohol, tobacco or illicit drugs  Current medications and supplements including opioid prescriptions. Patient is not currently taking opioid prescriptions. Functional ability and status Nutritional status Physical activity Advanced directives List of other physicians Hospitalizations, surgeries, and ER  visits in previous 12 months Vitals Screenings to include cognitive, depression, and falls Referrals and appointments  In addition, I have reviewed and discussed with patient certain preventive protocols, quality metrics, and best practice recommendations. A written personalized care plan for preventive services as well as general preventive health recommendations were provided to patient.     Lauree Chandler, NP   08/24/2022    Virtual Visit via Telephone Note  I connected with patient 08/24/22 at  3:20 PM EDT by telephone and verified that I am speaking with the correct person using two identifiers.  Location: Patient: home Provider: [facility]  I discussed the limitations, risks, security and privacy concerns of performing an evaluation and management service by telephone and the availability of in person appointments. I also discussed with the patient that there may be a patient responsible charge related to this service. The patient expressed understanding and agreed to proceed.   I discussed the assessment and treatment plan with the patient. The patient was provided an opportunity to ask questions and all were answered. The patient agreed with the plan and demonstrated an understanding of the instructions.   The patient was advised to call back or seek an in-person evaluation if the symptoms worsen or if the condition fails to improve as anticipated.  I provided 15 minutes of non-face-to-face time during this encounter.  Carlos American. Harle Battiest Avs printed and mailed

## 2022-09-11 ENCOUNTER — Other Ambulatory Visit: Payer: Self-pay | Admitting: Nurse Practitioner

## 2022-09-11 DIAGNOSIS — Z1231 Encounter for screening mammogram for malignant neoplasm of breast: Secondary | ICD-10-CM

## 2022-09-18 ENCOUNTER — Encounter: Payer: Self-pay | Admitting: Nurse Practitioner

## 2022-09-18 ENCOUNTER — Ambulatory Visit (INDEPENDENT_AMBULATORY_CARE_PROVIDER_SITE_OTHER): Payer: Medicare PPO | Admitting: Nurse Practitioner

## 2022-09-18 VITALS — BP 126/80 | HR 100 | Temp 96.8°F | Resp 18 | Ht 67.5 in | Wt 253.0 lb

## 2022-09-18 DIAGNOSIS — E782 Mixed hyperlipidemia: Secondary | ICD-10-CM

## 2022-09-18 DIAGNOSIS — M1991 Primary osteoarthritis, unspecified site: Secondary | ICD-10-CM | POA: Diagnosis not present

## 2022-09-18 DIAGNOSIS — R739 Hyperglycemia, unspecified: Secondary | ICD-10-CM

## 2022-09-18 DIAGNOSIS — I10 Essential (primary) hypertension: Secondary | ICD-10-CM | POA: Diagnosis not present

## 2022-09-18 DIAGNOSIS — Z6838 Body mass index (BMI) 38.0-38.9, adult: Secondary | ICD-10-CM

## 2022-09-18 DIAGNOSIS — Z23 Encounter for immunization: Secondary | ICD-10-CM | POA: Diagnosis not present

## 2022-09-18 NOTE — Progress Notes (Signed)
Careteam: Patient Care Team: Lauree Chandler, NP as PCP - General (Geriatric Medicine)  PLACE OF SERVICE:  [place] Directive information Does Patient Have a Medical Advance Directive?: No, Does patient want to make changes to medical advance directive?: No - Patient declined  Allergies  Allergen Reactions   Skin Adhesives [Cyanoacrylate] Itching    Chief Complaint  Patient presents with   Medical Management of Chronic Issues    Yearly Routine.   Immunizations    Discuss the need for Pne vaccine, Tetanus vaccine, Covid Booster, and Influenza vaccine.     HPI: Patient is a 66 y.o. female for routine follow up She tries to remain active and exercise 3 days a week But she sits for hours during work. She will have some LE edema if she is sitting prolong time.  When she gets up and walks it helps She types a lot and works online, has a nonpainful knot in her wrist.   Htn- controlled.   Hyperlipidemia- diet controlled.   She has gotten several vaccines at the drug store.    Review of Systems:  Review of Systems  Constitutional:  Negative for chills, fever and weight loss.  HENT:  Negative for tinnitus.   Respiratory:  Negative for cough, sputum production and shortness of breath.   Cardiovascular:  Negative for chest pain, palpitations and leg swelling.  Gastrointestinal:  Negative for abdominal pain, constipation, diarrhea and heartburn.  Genitourinary:  Negative for dysuria, frequency and urgency.  Musculoskeletal:  Negative for back pain, falls, joint pain and myalgias.  Skin: Negative.   Neurological:  Negative for dizziness and headaches.  Psychiatric/Behavioral:  Negative for depression and memory loss. The patient does not have insomnia.     Past Medical History:  Diagnosis Date   Arthritis    "right knee" (05/27/2015)   Family history of adverse reaction to anesthesia    "it's hard to bring my father back; more than once too" (05/27/2015)    GERD (gastroesophageal reflux disease)    Hypercalcemia    parathyroid gland removed   Hypertension    controlled with medication   Obesity (BMI 30-39.9)    Parathyroid cyst ([place])    Superficial fungus infection of skin    on chest   Thyroid cancer ([place]) 2016   Thyroid mass    "2"   Past Surgical History:  Procedure Laterality Date   CESAREAN SECTION  1997   IRRIGATION AND DEBRIDEMENT SEBACEOUS CYST  ~ 2010   on back   PARATHYROIDECTOMY Right 05/27/2015   PARATHYROIDECTOMY Right 05/27/2015   Procedure: RIGHT PARATHYROIDECTOMY WITH FROZEN SECTION;  Surgeon: Izora Gala, MD;  Location: [place];  Service: ENT;  Laterality: Right;   THYROID LOBECTOMY Right 05/27/2015   THYROIDECTOMY Right 05/27/2015   Procedure: RIGHT THYROID LOBECTOMY WITH FROZEN SECTION;  Surgeon: Izora Gala, MD;  Location: [place];  Service: ENT;  Laterality: Right;   [place]   Social History:   reports that she has quit smoking. Her smoking use included cigarettes. She has a 1.00 pack-year smoking history. She has never used smokeless tobacco. She reports current alcohol use. She reports that she does not use drugs.  Family History  Problem Relation Age of Onset   Congestive Heart Failure Mother    Diabetes Mother 27   Hypertension Mother    Congestive Heart Failure Father    Multiple myeloma Brother 44   Diabetes Sister 63   Hypertension Sister  Hypertension Brother    Hypertension Brother    Hyperlipidemia Brother    Hypertension Brother    Hyperlipidemia Brother     Medications: Patient's Medications  New Prescriptions   No medications on file  Previous Medications   AMLODIPINE (NORVASC) 10 MG TABLET    TAKE 1 TABLET DAILY   MECLIZINE (ANTIVERT) 12.5 MG TABLET    Take 1 tablet (12.5 mg total) by mouth 3 (three) times daily as needed for dizziness.   MISC NATURAL PRODUCTS (OSTEO BI-FLEX JOINT SHIELD PO)    Take 1 tablet by mouth daily.   MULTIPLE VITAMINS-MINERALS (MULTIVITAMIN ADULTS  50+ PO)    Take by mouth daily.   ST JOHNS WORT 300 MG CAPS    Take 1 capsule by mouth daily.   TRIAMTERENE-HYDROCHLOROTHIAZIDE (MAXZIDE-25) 37.5-25 MG TABLET    TAKE 1 TABLET DAILY  Modified Medications   No medications on file  Discontinued Medications   No medications on file    Physical Exam:  Vitals:   09/18/22 0845  BP: 126/80  Pulse: 100  Resp: 18  Temp: (!) 96.8 F (36 C)  SpO2: 95%  Weight: 253 lb (114.8 kg)  Height: 5' 7.5" (1.715 m)   Body mass index is 39.04 kg/m. Wt Readings from Last 3 Encounters:  09/18/22 253 lb (114.8 kg)  08/18/21 250 lb (113.4 kg)  08/08/21 251 lb 6.4 oz (114 kg)    Physical Exam Constitutional:      General: She is not in acute distress.    Appearance: She is well-developed. She is not diaphoretic.  HENT:     Head: Normocephalic and atraumatic.     Mouth/Throat:     Pharynx: No oropharyngeal exudate.  Eyes:     Conjunctiva/sclera: Conjunctivae normal.     Pupils: Pupils are equal, round, and reactive to light.  Cardiovascular:     Rate and Rhythm: Normal rate and regular rhythm.     Heart sounds: Normal heart sounds.  Pulmonary:     Effort: Pulmonary effort is normal.     Breath sounds: Normal breath sounds.  Abdominal:     General: Bowel sounds are normal.     Palpations: Abdomen is soft.  Musculoskeletal:     Cervical back: Normal range of motion and neck supple.     Right lower leg: No edema.     Left lower leg: No edema.  Skin:    General: Skin is warm and dry.  Neurological:     Mental Status: She is alert.  Psychiatric:        Mood and Affect: Mood normal.     Labs reviewed: Basic Metabolic Panel: No results for input(s): "NA", "K", "CL", "CO2", "GLUCOSE", "BUN", "CREATININE", "CALCIUM", "MG", "PHOS", "TSH" in the last 8760 hours. Liver Function Tests: No results for input(s): "AST", "ALT", "ALKPHOS", "BILITOT", "PROT", "ALBUMIN" in the last 8760 hours. No results for input(s): "LIPASE", "AMYLASE" in the  last 8760 hours. No results for input(s): "AMMONIA" in the last 8760 hours. CBC: No results for input(s): "WBC", "NEUTROABS", "HGB", "HCT", "MCV", "PLT" in the last 8760 hours. Lipid Panel: No results for input(s): "CHOL", "HDL", "LDLCALC", "TRIG", "CHOLHDL", "LDLDIRECT" in the last 8760 hours. TSH: No results for input(s): "TSH" in the last 8760 hours. A1C: Lab Results  Component Value Date   HGBA1C 5.7 (H) 07/16/2021     Assessment/Plan 1. Essential hypertension -Blood pressure well controlled, goal bp <140/90 Continue current medications and dietary modifications follow metabolic panel - COMPLETE METABOLIC PANEL  WITH GFR - CBC with Differential/Platelet  2. Mixed hyperlipidemia -overdue for labs, discussed lifestyle modifications.  - Lipid panel - COMPLETE METABOLIC PANEL WITH GFR  3. Hyperglycemia -dietary and lifestyle modificaiton discussed - Hemoglobin A1c  4. Class 2 severe obesity due to excess calories with serious comorbidity and body mass index (BMI) of 38.0 to 38.9 in adult [facility]) --education provided on healthy weight loss through increase in physical activity and proper nutrition   5. Need for influenza vaccination - Flu Vaccine QUAD High Dose(Fluad)  6. Primary osteoarthritis, unspecified site Stable, can use tylenol PRN    Return in about 6 months (around 03/20/2023) for routne follow up .: Vander Kueker K. [place], [facility] [phone]

## 2022-09-19 LAB — LIPID PANEL
Cholesterol: 180 mg/dL (ref <200)
HDL: 46 mg/dL — ABNORMAL LOW (ref >=50)
LDL Cholesterol (Calc): 118 mg/dL (calc) — ABNORMAL HIGH
Non-HDL Cholesterol (Calc): 134 mg/dL (calc) — ABNORMAL HIGH (ref <130)
Total CHOL/HDL Ratio: 3.9 (calc) (ref <5.0)
Triglycerides: 72 mg/dL (ref <150)

## 2022-09-19 LAB — COMPLETE METABOLIC PANEL WITH GFR
AG Ratio: 1.4 (calc) (ref 1.0–2.5)
ALT: 16 U/L (ref 6–29)
AST: 17 U/L (ref 10–35)
Albumin: 4.3 g/dL (ref 3.6–5.1)
Alkaline phosphatase (APISO): 100 U/L (ref 37–153)
BUN: 12 mg/dL (ref 7–25)
CO2: 32 mmol/L (ref 20–32)
Calcium: 9.6 mg/dL (ref 8.6–10.4)
Chloride: 103 mmol/L (ref 98–110)
Creat: 0.75 mg/dL (ref 0.50–1.05)
Globulin: 3.1 g/dL (calc) (ref 1.9–3.7)
Glucose, Bld: 100 mg/dL — ABNORMAL HIGH (ref 65–99)
Potassium: 4 mmol/L (ref 3.5–5.3)
Sodium: 141 mmol/L (ref 135–146)
Total Bilirubin: 0.6 mg/dL (ref 0.2–1.2)
Total Protein: 7.4 g/dL (ref 6.1–8.1)
eGFR: 88 mL/min/{1.73_m2} (ref >=60)

## 2022-09-19 LAB — CBC WITH DIFFERENTIAL/PLATELET
Absolute Monocytes: 317 cells/uL (ref 200–950)
Basophils Absolute: 28 cells/uL (ref 0–200)
Basophils Relative: 0.6 %
Eosinophils Absolute: 147 cells/uL (ref 15–500)
Eosinophils Relative: 3.2 %
HCT: 42.7 % (ref 35.0–45.0)
Hemoglobin: 14 g/dL (ref 11.7–15.5)
Lymphs Abs: 1472 cells/uL (ref 850–3900)
MCH: 27 pg (ref 27.0–33.0)
MCHC: 32.8 g/dL (ref 32.0–36.0)
MCV: 82.3 fL (ref 80.0–100.0)
MPV: 12.4 fL (ref 7.5–12.5)
Monocytes Relative: 6.9 %
Neutro Abs: 2636 cells/uL (ref 1500–7800)
Neutrophils Relative %: 57.3 %
Platelets: 283 10*3/uL (ref 140–400)
RBC: 5.19 10*6/uL — ABNORMAL HIGH (ref 3.80–5.10)
RDW: 14.5 % (ref 11.0–15.0)
Total Lymphocyte: 32 %
WBC: 4.6 10*3/uL (ref 3.8–10.8)

## 2022-09-19 LAB — HEMOGLOBIN A1C
Hgb A1c MFr Bld: 5.9 % of total Hgb — ABNORMAL HIGH (ref <5.7)
Mean Plasma Glucose: 123 mg/dL
eAG (mmol/L): 6.8 mmol/L

## 2022-10-27 ENCOUNTER — Ambulatory Visit
Admission: RE | Admit: 2022-10-27 | Discharge: 2022-10-27 | Disposition: A | Discharge status: Home / Self Care | Payer: TRICARE For Life (TFL) | Source: Ambulatory Visit | Attending: Nurse Practitioner | Admitting: Nurse Practitioner

## 2022-10-27 DIAGNOSIS — Z1231 Encounter for screening mammogram for malignant neoplasm of breast: Secondary | ICD-10-CM | POA: Diagnosis not present

## 2022-10-29 ENCOUNTER — Other Ambulatory Visit: Payer: Self-pay | Admitting: Nurse Practitioner

## 2022-10-29 DIAGNOSIS — R928 Other abnormal and inconclusive findings on diagnostic imaging of breast: Secondary | ICD-10-CM

## 2022-11-09 ENCOUNTER — Ambulatory Visit
Admission: RE | Admit: 2022-11-09 | Discharge: 2022-11-09 | Disposition: A | Discharge status: Home / Self Care | Payer: Medicare PPO | Source: Ambulatory Visit | Attending: Nurse Practitioner | Admitting: Nurse Practitioner

## 2022-11-09 ENCOUNTER — Other Ambulatory Visit: Payer: Self-pay | Admitting: Nurse Practitioner

## 2022-11-09 DIAGNOSIS — R928 Other abnormal and inconclusive findings on diagnostic imaging of breast: Secondary | ICD-10-CM

## 2022-11-09 DIAGNOSIS — N6320 Unspecified lump in the left breast, unspecified quadrant: Secondary | ICD-10-CM

## 2022-11-09 DIAGNOSIS — N6325 Unspecified lump in the left breast, overlapping quadrants: Secondary | ICD-10-CM | POA: Diagnosis not present

## 2022-11-24 ENCOUNTER — Other Ambulatory Visit: Payer: Self-pay | Admitting: Medical

## 2022-12-03 ENCOUNTER — Other Ambulatory Visit: Payer: Self-pay

## 2022-12-03 MED ORDER — AMLODIPINE BESYLATE 10 MG PO TABS: 10.0000 mg | ORAL_TABLET | Freq: Every day | ORAL | 0 refills | Status: DC

## 2022-12-14 ENCOUNTER — Other Ambulatory Visit: Payer: Self-pay

## 2022-12-14 MED ORDER — AMLODIPINE BESYLATE 10 MG PO TABS: 10.0000 mg | ORAL_TABLET | Freq: Every day | ORAL | 1 refills | Status: DC

## 2023-03-05 ENCOUNTER — Other Ambulatory Visit: Payer: Self-pay | Admitting: Nurse Practitioner

## 2023-03-05 DIAGNOSIS — I10 Essential (primary) hypertension: Secondary | ICD-10-CM

## 2023-03-19 ENCOUNTER — Encounter: Payer: Self-pay | Admitting: Nurse Practitioner

## 2023-03-22 ENCOUNTER — Ambulatory Visit (INDEPENDENT_AMBULATORY_CARE_PROVIDER_SITE_OTHER): Payer: Medicare HMO | Admitting: Nurse Practitioner

## 2023-03-22 ENCOUNTER — Encounter: Payer: Self-pay | Admitting: Nurse Practitioner

## 2023-03-22 VITALS — BP 142/88 | HR 90 | Temp 97.3°F | Ht 67.5 in | Wt 255.0 lb

## 2023-03-22 DIAGNOSIS — R739 Hyperglycemia, unspecified: Secondary | ICD-10-CM

## 2023-03-22 DIAGNOSIS — Z6839 Body mass index (BMI) 39.0-39.9, adult: Secondary | ICD-10-CM | POA: Diagnosis not present

## 2023-03-22 DIAGNOSIS — E782 Mixed hyperlipidemia: Secondary | ICD-10-CM

## 2023-03-22 DIAGNOSIS — M25532 Pain in left wrist: Secondary | ICD-10-CM

## 2023-03-22 DIAGNOSIS — M1991 Primary osteoarthritis, unspecified site: Secondary | ICD-10-CM

## 2023-03-22 DIAGNOSIS — I10 Essential (primary) hypertension: Secondary | ICD-10-CM | POA: Diagnosis not present

## 2023-03-22 NOTE — Patient Instructions (Signed)
Please send me your blood pressure readings via mychart

## 2023-03-22 NOTE — Progress Notes (Signed)
Careteam: Patient Care Team: [name], NP as PCP - General (Geriatric Medicine)  PLACE OF SERVICE:  [facility]  Advanced Directive information Does Patient Have a Medical Advance Directive?: No, Would patient like information on creating a medical advance directive?: Yes (MAU/Ambulatory/Procedural Areas - Information given)  Allergies  Allergen Reactions   Skin Adhesives [Cyanoacrylate] Itching    Chief Complaint  Patient presents with   Medical Management of Chronic Issues    6 month follow-up. Discuss need for td/tdap and covid booster or post pone if patient refuses or is not a candidate. [facility] verified. Patient would like left wrist re-examined for lump and pain.      HPI: Patient is a 67 y.o. female for routine follow up.   Reports she is having ongoing pain in wrist into hand.  She types a lot and makes it worse. Area throbs  She uses a brace which helps symptoms.  Does not happen all the time.  Not interested in surgery- 2 sisters have carpel tunnel syndrome  Took blood pressure medication on the way in. Generally blood pressure at home is better She hates coming to the doctor- every time she goes into office bp is high.   Goes to the gym 3-4 times a week Weights and cardiovascular  Reports she feels stronger and balance is better.   Having seasonal allergies   Review of Systems:  Review of Systems  Constitutional:  Negative for chills, fever and weight loss.  HENT:  Negative for tinnitus.   Respiratory:  Negative for cough, sputum production and shortness of breath.   Cardiovascular:  Negative for chest pain, palpitations and leg swelling.  Gastrointestinal:  Negative for abdominal pain, constipation, diarrhea and heartburn.  Genitourinary:  Negative for dysuria, frequency and urgency.  Musculoskeletal:  Positive for joint pain. Negative for back pain, falls and myalgias.  Skin: Negative.   Neurological:  Positive for sensory change. Negative for  dizziness and headaches.  Psychiatric/Behavioral:  Negative for depression and memory loss. The patient does not have insomnia.     Past Medical History:  Diagnosis Date   Arthritis    "right knee" (05/27/2015)   Family history of adverse reaction to anesthesia    "it's hard to bring my father back; more than once too" (05/27/2015)   GERD (gastroesophageal reflux disease)    Hypercalcemia    parathyroid gland removed   Hypertension    controlled with medication   Obesity (BMI 30-39.9)    Parathyroid cyst    Superficial fungus infection of skin    on chest   Thyroid cancer 2016   Thyroid mass    "2"   Past Surgical History:  Procedure Laterality Date   CESAREAN SECTION  1997   IRRIGATION AND DEBRIDEMENT SEBACEOUS CYST  ~ 2010   on back   PARATHYROIDECTOMY Right 05/27/2015   PARATHYROIDECTOMY Right 05/27/2015   Procedure: RIGHT PARATHYROIDECTOMY WITH FROZEN SECTION;  Surgeon: Serena Colonel, MD;  Location: [facility];  Service: ENT;  Laterality: Right;   THYROID LOBECTOMY Right 05/27/2015   THYROIDECTOMY Right 05/27/2015   Procedure: RIGHT THYROID LOBECTOMY WITH FROZEN SECTION;  Surgeon: Serena Colonel, MD;  Location: [facility] OR;  Service: ENT;  Laterality: Right;   TUBAL LIGATION  1997   Social History:   reports that she has quit smoking. Her smoking use included cigarettes. She has a 1.00 pack-year smoking history. She has never used smokeless tobacco. She reports current alcohol use. She reports that she does  not use drugs.  Family History  Problem Relation Age of Onset   Congestive Heart Failure Mother    Diabetes Mother 21   Hypertension Mother    Congestive Heart Failure Father    Multiple myeloma Brother 52   Diabetes Sister 27   Hypertension Sister    Hypertension Brother    Hypertension Brother    Hyperlipidemia Brother    Hypertension Brother    Hyperlipidemia Brother     Medications: Patient's Medications  New Prescriptions   No medications on file  Previous Medications    AMLODIPINE (NORVASC) 10 MG TABLET    Take 1 tablet (10 mg total) by mouth daily.   DM-APAP-CPM (CORICIDIN HBP PO)    Take by mouth as needed.   MECLIZINE (ANTIVERT) 12.5 MG TABLET    Take 1 tablet (12.5 mg total) by mouth 3 (three) times daily as needed for dizziness.   MISC NATURAL PRODUCTS (OSTEO BI-FLEX JOINT SHIELD PO)    Take 1 tablet by mouth daily.   MULTIPLE VITAMINS-MINERALS (MULTIVITAMIN ADULTS 50+ PO)    Take by mouth daily.   ST JOHNS WORT 300 MG CAPS    Take 1 capsule by mouth daily.   TRIAMTERENE-HYDROCHLOROTHIAZIDE (MAXZIDE-25) 37.5-25 MG TABLET    TAKE 1 TABLET DAILY  Modified Medications   No medications on file  Discontinued Medications   No medications on file    Physical Exam:  Vitals:   03/22/23 0825 03/22/23 0827  BP: (!) 144/98 (!) 142/88  Pulse: 90   Temp: (!) 97.3 F (36.3 C)   TempSrc: Temporal   SpO2: 99%   Weight: 255 lb (115.7 kg)   Height: 5' 7.5" (1.715 m)    Body mass index is 39.35 kg/m. Wt Readings from Last 3 Encounters:  03/22/23 255 lb (115.7 kg)  09/18/22 253 lb (114.8 kg)  08/18/21 250 lb (113.4 kg)    Physical Exam Constitutional:      General: She is not in acute distress.    Appearance: She is well-developed. She is not diaphoretic.  HENT:     Head: Normocephalic and atraumatic.     Mouth/Throat:     Pharynx: No oropharyngeal exudate.  Eyes:     Conjunctiva/sclera: Conjunctivae normal.     Pupils: Pupils are equal, round, and reactive to light.  Cardiovascular:     Rate and Rhythm: Normal rate and regular rhythm.     Heart sounds: Normal heart sounds.  Pulmonary:     Effort: Pulmonary effort is normal.     Breath sounds: Normal breath sounds.  Abdominal:     General: Bowel sounds are normal.     Palpations: Abdomen is soft.  Musculoskeletal:        General: Normal range of motion.     Cervical back: Normal range of motion and neck supple.     Right lower leg: No edema.     Left lower leg: No edema.  Skin:     General: Skin is warm and dry.  Neurological:     Mental Status: She is alert.  Psychiatric:        Mood and Affect: Mood normal.     Labs reviewed: Basic Metabolic Panel: Recent Labs    09/18/22 0910  NA 141  K 4.0  CL 103  CO2 32  GLUCOSE 100*  BUN 12  CREATININE 0.75  CALCIUM 9.6   Liver Function Tests: Recent Labs    09/18/22 0910  AST 17  ALT 16  BILITOT 0.6  PROT 7.4   No results for input(s): "LIPASE", "AMYLASE" in the last 8760 hours. No results for input(s): "AMMONIA" in the last 8760 hours. CBC: Recent Labs    09/18/22 0910  WBC 4.6  NEUTROABS 2,636  HGB 14.0  HCT 42.7  MCV 82.3  PLT 283   Lipid Panel: Recent Labs    09/18/22 0910  CHOL 180  HDL 46*  LDLCALC 118*  TRIG 72  CHOLHDL 3.9   TSH: No results for input(s): "TSH" in the last 8760 hours. A1C: Lab Results  Component Value Date   HGBA1C 5.9 (H) 09/18/2022     Assessment/Plan 1. Hyperglycemia -encouraged dietary modifications. Continue exercise - Hemoglobin A1c  2. Mixed hyperlipidemia -continue with dietary modifications - Lipid panel  3. Essential hypertension -Blood pressure well controlled, goal bp <140/90 Continue current medications and dietary modifications follow metabolic panel - COMPLETE METABOLIC PANEL WITH GFR - CBC with Differential/Platelet  4. Class 2 severe obesity due to excess calories with serious comorbidity and body mass index (BMI) of 39.0 to 39.9 in adult --education provided on healthy weight loss through increase in physical activity and proper nutrition   5. Primary osteoarthritis, unspecified site -stable, can use tylenol PRN  6. Left wrist pain -suspect carpel tunnel. Wrist brace has been effective. Unable to reproduce pain today.  Will continue with supportive care. Does not want any additional intervention at this time.   Return in about 6 months (around 09/21/2023) for routine follow up.  Michelle Campos. Biagio Borg [facility] [phone]

## 2023-03-23 LAB — CBC WITH DIFFERENTIAL/PLATELET
Absolute Monocytes: 343 cells/uL (ref 200–950)
Basophils Absolute: 31 cells/uL (ref 0–200)
Basophils Relative: 0.7 %
Eosinophils Absolute: 141 cells/uL (ref 15–500)
Eosinophils Relative: 3.2 %
HCT: 43.1 % (ref 35.0–45.0)
Hemoglobin: 13.9 g/dL (ref 11.7–15.5)
Lymphs Abs: 1373 cells/uL (ref 850–3900)
MCH: 27 pg (ref 27.0–33.0)
MCHC: 32.3 g/dL (ref 32.0–36.0)
MCV: 83.9 fL (ref 80.0–100.0)
MPV: 12.6 fL — ABNORMAL HIGH (ref 7.5–12.5)
Monocytes Relative: 7.8 %
Neutro Abs: 2512 cells/uL (ref 1500–7800)
Neutrophils Relative %: 57.1 %
Platelets: 279 10*3/uL (ref 140–400)
RBC: 5.14 10*6/uL — ABNORMAL HIGH (ref 3.80–5.10)
RDW: 14.5 % (ref 11.0–15.0)
Total Lymphocyte: 31.2 %
WBC: 4.4 10*3/uL (ref 3.8–10.8)

## 2023-03-23 LAB — COMPLETE METABOLIC PANEL WITH GFR
AG Ratio: 1.4 (calc) (ref 1.0–2.5)
ALT: 23 U/L (ref 6–29)
AST: 21 U/L (ref 10–35)
Albumin: 4.6 g/dL (ref 3.6–5.1)
Alkaline phosphatase (APISO): 106 U/L (ref 37–153)
BUN: 12 mg/dL (ref 7–25)
CO2: 33 mmol/L — ABNORMAL HIGH (ref 20–32)
Calcium: 10 mg/dL (ref 8.6–10.4)
Chloride: 103 mmol/L (ref 98–110)
Creat: 0.71 mg/dL (ref 0.50–1.05)
Globulin: 3.3 g/dL (calc) (ref 1.9–3.7)
Glucose, Bld: 117 mg/dL — ABNORMAL HIGH (ref 65–99)
Potassium: 4.2 mmol/L (ref 3.5–5.3)
Sodium: 143 mmol/L (ref 135–146)
Total Bilirubin: 0.5 mg/dL (ref 0.2–1.2)
Total Protein: 7.9 g/dL (ref 6.1–8.1)
eGFR: 93 mL/min/{1.73_m2} (ref >=60)

## 2023-03-23 LAB — LIPID PANEL
Cholesterol: 196 mg/dL (ref <200)
HDL: 49 mg/dL — ABNORMAL LOW (ref >=50)
LDL Cholesterol (Calc): 130 mg/dL (calc) — ABNORMAL HIGH
Non-HDL Cholesterol (Calc): 147 mg/dL (calc) — ABNORMAL HIGH (ref <130)
Total CHOL/HDL Ratio: 4 (calc) (ref <5.0)
Triglycerides: 75 mg/dL (ref <150)

## 2023-03-23 LAB — HEMOGLOBIN A1C
Hgb A1c MFr Bld: 6 % of total Hgb — ABNORMAL HIGH (ref <5.7)
Mean Plasma Glucose: 126 mg/dL
eAG (mmol/L): 7 mmol/L

## 2023-03-24 ENCOUNTER — Other Ambulatory Visit: Payer: Self-pay | Admitting: Nurse Practitioner

## 2023-03-24 DIAGNOSIS — E782 Mixed hyperlipidemia: Secondary | ICD-10-CM

## 2023-03-24 DIAGNOSIS — R739 Hyperglycemia, unspecified: Secondary | ICD-10-CM

## 2023-03-24 MED ORDER — ATORVASTATIN CALCIUM 10 MG PO TABS: 10.0000 mg | ORAL_TABLET | Freq: Every day | ORAL | 3 refills | Status: DC

## 2023-03-30 ENCOUNTER — Ambulatory Visit (INDEPENDENT_AMBULATORY_CARE_PROVIDER_SITE_OTHER): Payer: Medicare HMO | Admitting: Adult Health

## 2023-03-30 ENCOUNTER — Encounter: Payer: Self-pay | Admitting: Adult Health

## 2023-03-30 VITALS — BP 142/79 | HR 104 | Temp 97.3°F | Resp 20 | Ht 67.5 in | Wt 253.4 lb

## 2023-03-30 DIAGNOSIS — I10 Essential (primary) hypertension: Secondary | ICD-10-CM

## 2023-03-30 DIAGNOSIS — R7303 Prediabetes: Secondary | ICD-10-CM | POA: Diagnosis not present

## 2023-03-30 DIAGNOSIS — Z6839 Body mass index (BMI) 39.0-39.9, adult: Secondary | ICD-10-CM | POA: Diagnosis not present

## 2023-03-30 DIAGNOSIS — E782 Mixed hyperlipidemia: Secondary | ICD-10-CM

## 2023-03-30 DIAGNOSIS — J302 Other seasonal allergic rhinitis: Secondary | ICD-10-CM

## 2023-03-30 DIAGNOSIS — E66812 Obesity, class 2: Secondary | ICD-10-CM

## 2023-03-30 MED ORDER — FLUTICASONE PROPIONATE 50 MCG/ACT NA SUSP: 2.0000 | Freq: Every day | NASAL | 6 refills | Status: DC | PRN

## 2023-03-30 MED ORDER — CORICIDIN D COLD/FLU/SINUS 2-5-325 MG PO TABS: 1.0000 | ORAL_TABLET | Freq: Three times a day (TID) | ORAL | 0 refills | Status: AC | PRN

## 2023-03-30 MED ORDER — CETIRIZINE HCL 10 MG PO TABS
10.0000 mg | ORAL_TABLET | Freq: Every day | ORAL | 11 refills | Status: AC
Start: 2023-04-02 — End: ?

## 2023-03-30 NOTE — Progress Notes (Signed)
[facility] clinic  Provider: Kenard Gower DNP  Code Status:  Full Code  Goals of Care:     03/22/2023    8:23 AM  Advanced Directives  Does Patient Have a Medical Advance Directive? No  Would patient like information on creating a medical advance directive? Yes (MAU/Ambulatory/Procedural Areas - Information given)     Chief Complaint  Patient presents with   Acute Visit    Blood pressure elevated    HPI: Patient is a 67 y.o. female seen today for an acute visit for elevated BPs. She is currently taking Amlodipine and Triamterene-HCTZ for hypertension. Has been having dull nasal pain X 1 week. She complains of nasal congestion but afraid to take decongestant which might cause her BP to go up.  She had BP this morning 145/99 and 141/92. BP in the clinic is 142/79. BP re-check 130/80. She has occasional headache.  Latest weight  253.6 lbs, cut down on her butter and cheese.  Wt Readings from Last 3 Encounters:  03/30/23 253 lb 6 oz (114.9 kg)  03/22/23 255 lb (115.7 kg)  09/18/22 253 lb (114.8 kg)     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HCC)    Superficial fungus infection of skin    on chest   Thyroid cancer (HCC) 2016   Thyroid mass    "2"    Past Surgical History:  Procedure Laterality Date   CESAREAN SECTION  1997   IRRIGATION AND DEBRIDEMENT SEBACEOUS CYST  ~ 2010   on back   PARATHYROIDECTOMY Right 05/27/2015   PARATHYROIDECTOMY Right 05/27/2015   Procedure: RIGHT PARATHYROIDECTOMY WITH FROZEN SECTION;  Surgeon: Serena Colonel, MD;  Location: [facility] OR;  Service: ENT;  Laterality: Right;   THYROID LOBECTOMY Right 05/27/2015    THYROIDECTOMY Right 05/27/2015   Procedure: RIGHT THYROID LOBECTOMY WITH FROZEN SECTION;  Surgeon: Serena Colonel, MD;  Location: [facility] OR;  Service: ENT;  Laterality: Right;   TUBAL LIGATION  1997    Allergies  Allergen Reactions   Skin Adhesives [Cyanoacrylate] Itching    Outpatient Encounter Medications as of 03/30/2023  Medication Sig   amLODipine (NORVASC) 10 MG tablet Take 1 tablet (10 mg total) by mouth daily.   atorvastatin (LIPITOR) 10 MG tablet Take 1 tablet (10 mg total) by mouth daily.   DM-APAP-CPM (CORICIDIN HBP PO) Take by mouth as needed.   meclizine (ANTIVERT) 12.5 MG tablet Take 1 tablet (12.5 mg total) by mouth 3 (three) times daily as needed for dizziness.   Misc Natural Products (OSTEO BI-FLEX JOINT SHIELD PO) Take 1 tablet by mouth daily.   Multiple Vitamins-Minerals (MULTIVITAMIN ADULTS 50+ PO) Take by mouth daily.   St Johns Wort 300 MG CAPS Take 1 capsule by mouth daily.   triamterene-hydrochlorothiazide (MAXZIDE-25) 37.5-25 MG tablet TAKE 1 TABLET DAILY   No facility-administered encounter medications on file as of 03/30/2023.    Review of Systems:  Review of Systems  Constitutional:  Negative for appetite change, chills, fatigue and fever.  HENT:  Positive for sinus pressure and sinus pain. Negative for congestion, hearing loss, rhinorrhea and sore throat.  Eyes: Negative.   Respiratory:  Negative for cough, shortness of breath and wheezing.   Cardiovascular:  Negative for chest pain, palpitations and leg swelling.  Gastrointestinal:  Negative for abdominal pain, constipation, diarrhea, nausea and vomiting.  Genitourinary:  Negative for dysuria.  Musculoskeletal:  Negative for arthralgias, back pain and myalgias.  Skin:  Negative for color change, rash and wound.  Neurological:  Positive for headaches. Negative for dizziness and weakness.  Psychiatric/Behavioral:  Negative for behavioral problems. The patient is not nervous/anxious.     Health Maintenance   Topic Date Due   DTaP/Tdap/Td (1 - Tdap) Never done   COVID-19 Vaccine (4 - 2023-24 season) 07/31/2022   INFLUENZA VACCINE  07/01/2023   Medicare Annual Wellness (AWV)  08/25/2023   MAMMOGRAM  10/27/2024   Fecal DNA (Cologuard)  04/28/2025   Pneumonia Vaccine 26+ Years old  Completed   DEXA SCAN  Completed   Hepatitis C Screening  Completed   Zoster Vaccines- Shingrix  Completed   HPV VACCINES  Aged Out    Physical Exam: Vitals:   03/30/23 0916  Height: 5' 7.5" (1.715 m)   Body mass index is 39.35 kg/m. Physical Exam Constitutional:      Appearance: Normal appearance. She is obese.  HENT:     Head: Normocephalic and atraumatic.     Nose: Nose normal.     Mouth/Throat:     Mouth: Mucous membranes are moist.  Eyes:     Conjunctiva/sclera: Conjunctivae normal.  Cardiovascular:     Rate and Rhythm: Normal rate and regular rhythm.  Pulmonary:     Effort: Pulmonary effort is normal.     Breath sounds: Normal breath sounds.  Abdominal:     General: Bowel sounds are normal.     Palpations: Abdomen is soft.  Musculoskeletal:        General: Normal range of motion.     Cervical back: Normal range of motion.  Skin:    General: Skin is warm and dry.  Neurological:     General: No focal deficit present.     Mental Status: She is alert and oriented to person, place, and time.  Psychiatric:        Mood and Affect: Mood normal.        Behavior: Behavior normal.        Thought Content: Thought content normal.        Judgment: Judgment normal.     Labs reviewed: Basic Metabolic Panel: Recent Labs    09/18/22 0910 03/22/23 0858  NA 141 143  K 4.0 4.2  CL 103 103  CO2 32 33*  GLUCOSE 100* 117*  BUN 12 12  CREATININE 0.75 0.71  CALCIUM 9.6 10.0   Liver Function Tests: Recent Labs    09/18/22 0910 03/22/23 0858  AST 17 21  ALT 16 23  BILITOT 0.6 0.5  PROT 7.4 7.9   No results for input(s): "LIPASE", "AMYLASE" in the last 8760 hours. No results for  input(s): "AMMONIA" in the last 8760 hours. CBC: Recent Labs    09/18/22 0910 03/22/23 0858  WBC 4.6 4.4  NEUTROABS 2,636 2,512  HGB 14.0 13.9  HCT 42.7 43.1  MCV 82.3 83.9  PLT 283 279   Lipid Panel: Recent Labs    09/18/22 0910 03/22/23 0858  CHOL 180 196  HDL 46* 49*  LDLCALC 118* 478*  TRIG 72 75  CHOLHDL 3.9 4.0   Lab Results  Component Value Date   HGBA1C 6.0 (H)  03/22/2023    Procedures since last visit: No results found.  Assessment/Plan  1. Essential hypertension -  continue Amlodipine and Triamterene-HCTZ  2. Seasonal allergies -  thought to cause the sinus pressure - Chlorphen-Phenyleph-APAP (CORICIDIN D COLD/FLU/SINUS) 2-5-325 MG TABS; Take 1 tablet by mouth every 8 (eight) hours as needed for up to 3 days.  Dispense: 30 tablet; Refill: 0 - fluticasone (FLONASE) 50 MCG/ACT nasal spray; Place 2 sprays into both nostrils daily as needed for allergies or rhinitis.  Dispense: 16 g; Refill: 6 - cetirizine (ZYRTEC) 10 MG tablet; Take 1 tablet (10 mg total) by mouth at bedtime.  Dispense: 30 tablet; Refill: 11  3. Class 2 severe obesity due to excess calories with serious comorbidity and body mass index (BMI) of 39.0 to 39.9 in adult [facility]) Body mass index is 39.1 kg/m.  -  plans to have at least 10 lbs weight loss in 6 months -  counseled regarding exercise and diet  4. Prediabetes Lab Results  Component Value Date   HGBA1C 6.0 (H) 03/22/2023   -  diet-controlled  5. Mixed hyperlipidemia Lab Results  Component Value Date   CHOL 196 03/22/2023   HDL 49 (L) 03/22/2023   LDLCALC 130 (H) 03/22/2023   TRIG 75 03/22/2023   CHOLHDL 4.0 03/22/2023    -   continue Atorvastatin    Labs/tests ordered:   None  Next appt:  05/05/2023

## 2023-03-30 NOTE — Patient Instructions (Signed)
Hypertension, Adult High blood pressure (hypertension) is when the force of blood pumping through the arteries is too strong. The arteries are the blood vessels that carry blood from the heart throughout the body. Hypertension forces the heart to work harder to pump blood and may cause arteries to become narrow or stiff. Untreated or uncontrolled hypertension can lead to a heart attack, heart failure, a stroke, kidney disease, and other problems. A blood pressure reading consists of a higher number over a lower number. Ideally, your blood pressure should be below 120/80. The first ("top") number is called the systolic pressure. It is a measure of the pressure in your arteries as your heart beats. The second ("bottom") number is called the diastolic pressure. It is a measure of the pressure in your arteries as the heart relaxes. What are the causes? The exact cause of this condition is not known. There are some conditions that result in high blood pressure. What increases the risk? Certain factors may make you more likely to develop high blood pressure. Some of these risk factors are under your control, including: Smoking. Not getting enough exercise or physical activity. Being overweight. Having too much fat, sugar, calories, or salt (sodium) in your diet. Drinking too much alcohol. Other risk factors include: Having a personal history of heart disease, diabetes, high cholesterol, or kidney disease. Stress. Having a family history of high blood pressure and high cholesterol. Having obstructive sleep apnea. Age. The risk increases with age. What are the signs or symptoms? High blood pressure may not cause symptoms. Very high blood pressure (hypertensive crisis) may cause: Headache. Fast or irregular heartbeats (palpitations). Shortness of breath. Nosebleed. Nausea and vomiting. Vision changes. Severe chest pain, dizziness, and seizures. How is this diagnosed? This condition is diagnosed by  measuring your blood pressure while you are seated, with your arm resting on a flat surface, your legs uncrossed, and your feet flat on the floor. The cuff of the blood pressure monitor will be placed directly against the skin of your upper arm at the level of your heart. Blood pressure should be measured at least twice using the same arm. Certain conditions can cause a difference in blood pressure between your right and left arms. If you have a high blood pressure reading during one visit or you have normal blood pressure with other risk factors, you may be asked to: Return on a different day to have your blood pressure checked again. Monitor your blood pressure at home for 1 week or longer. If you are diagnosed with hypertension, you may have other blood or imaging tests to help your health care provider understand your overall risk for other conditions. How is this treated? This condition is treated by making healthy lifestyle changes, such as eating healthy foods, exercising more, and reducing your alcohol intake. You may be referred for counseling on a healthy diet and physical activity. Your health care provider may prescribe medicine if lifestyle changes are not enough to get your blood pressure under control and if: Your systolic blood pressure is above 130. Your diastolic blood pressure is above 80. Your personal target blood pressure may vary depending on your medical conditions, your age, and other factors. Follow these instructions at home: Eating and drinking  Eat a diet that is high in fiber and potassium, and low in sodium, added sugar, and fat. An example of this eating plan is called the DASH diet. DASH stands for Dietary Approaches to Stop Hypertension. To eat this way: Eat   plenty of fresh fruits and vegetables. Try to fill one half of your plate at each meal with fruits and vegetables. Eat whole grains, such as whole-wheat pasta, brown rice, or whole-grain bread. Fill about one  fourth of your plate with whole grains. Eat or drink low-fat dairy products, such as skim milk or low-fat yogurt. Avoid fatty cuts of meat, processed or cured meats, and poultry with skin. Fill about one fourth of your plate with lean proteins, such as fish, chicken without skin, beans, eggs, or tofu. Avoid pre-made and processed foods. These tend to be higher in sodium, added sugar, and fat. Reduce your daily sodium intake. Many people with hypertension should eat less than 1,500 mg of sodium a day. Do not drink alcohol if: Your health care provider tells you not to drink. You are pregnant, may be pregnant, or are planning to become pregnant. If you drink alcohol: Limit how much you have to: 0-1 drink a day for women. 0-2 drinks a day for men. Know how much alcohol is in your drink. In the [place] one drink equals one 12 oz bottle of beer (355 mL), one 5 oz glass of wine (148 mL), or one 1 oz glass of hard liquor (44 mL). Lifestyle  Work with your health care provider to maintain a healthy body weight or to lose weight. Ask what an ideal weight is for you. Get at least 30 minutes of exercise that causes your heart to beat faster (aerobic exercise) most days of the week. Activities may include walking, swimming, or biking. Include exercise to strengthen your muscles (resistance exercise), such as Pilates or lifting weights, as part of your weekly exercise routine. Try to do these types of exercises for 30 minutes at least 3 days a week. Do not use any products that contain nicotine or tobacco. These products include cigarettes, chewing tobacco, and vaping devices, such as e-cigarettes. If you need help quitting, ask your health care provider. Monitor your blood pressure at home as told by your health care provider. Keep all follow-up visits. This is important. Medicines Take over-the-counter and prescription medicines only as told by your health care provider. Follow directions carefully. Blood  pressure medicines must be taken as prescribed. Do not skip doses of blood pressure medicine. Doing this puts you at risk for problems and can make the medicine less effective. Ask your health care provider about side effects or reactions to medicines that you should watch for. Contact a health care provider if you: Think you are having a reaction to a medicine you are taking. Have headaches that keep coming back (recurring). Feel dizzy. Have swelling in your ankles. Have trouble with your vision. Get help right away if you: Develop a severe headache or confusion. Have unusual weakness or numbness. Feel faint. Have severe pain in your chest or abdomen. Vomit repeatedly. Have trouble breathing. These symptoms may be an emergency. Get help right away. Call 911. Do not wait to see if the symptoms will go away. Do not drive yourself to the hospital. Summary Hypertension is when the force of blood pumping through your arteries is too strong. If this condition is not controlled, it may put you at risk for serious complications. Your personal target blood pressure may vary depending on your medical conditions, your age, and other factors. For most people, a normal blood pressure is less than 120/80. Hypertension is treated with lifestyle changes, medicines, or a combination of both. Lifestyle changes include losing weight, eating a healthy,   low-sodium diet, exercising more, and limiting alcohol. This information is not intended to replace advice given to you by your health care provider. Make sure you discuss any questions you have with your health care provider. Document Revised: 09/23/2021 Document Reviewed: 09/23/2021 Elsevier Patient Education  2023 [employer] Inc.  

## 2023-04-30 ENCOUNTER — Other Ambulatory Visit: Payer: Self-pay

## 2023-04-30 DIAGNOSIS — I10 Essential (primary) hypertension: Secondary | ICD-10-CM

## 2023-05-05 ENCOUNTER — Other Ambulatory Visit: Payer: Medicare HMO

## 2023-05-06 ENCOUNTER — Other Ambulatory Visit: Payer: Self-pay | Admitting: Nurse Practitioner

## 2023-05-06 ENCOUNTER — Ambulatory Visit
Admission: RE | Admit: 2023-05-06 | Discharge: 2023-05-06 | Disposition: A | Discharge status: Home / Self Care | Payer: TRICARE For Life (TFL) | Source: Ambulatory Visit | Attending: Nurse Practitioner | Admitting: Nurse Practitioner

## 2023-05-06 DIAGNOSIS — N6320 Unspecified lump in the left breast, unspecified quadrant: Secondary | ICD-10-CM

## 2023-05-06 DIAGNOSIS — N63 Unspecified lump in unspecified breast: Secondary | ICD-10-CM | POA: Diagnosis not present

## 2023-05-06 DIAGNOSIS — R928 Other abnormal and inconclusive findings on diagnostic imaging of breast: Secondary | ICD-10-CM

## 2023-05-16 IMAGING — MG MM DIGITAL SCREENING BILAT W/ TOMO AND CAD
8 series · 8 of 24 positions shown · non-contrast
Comparison: Previous exam(s).

CLINICAL DATA: Screening.

EXAM:
DIGITAL SCREENING BILATERAL MAMMOGRAM WITH TOMOSYNTHESIS AND CAD
TECHNIQUE: Bilateral screening digital craniocaudal and mediolateral oblique
mammograms were obtained. Bilateral screening digital breast
tomosynthesis was performed. The images were evaluated with
computer-aided detection.

[L MLO synth-2D]
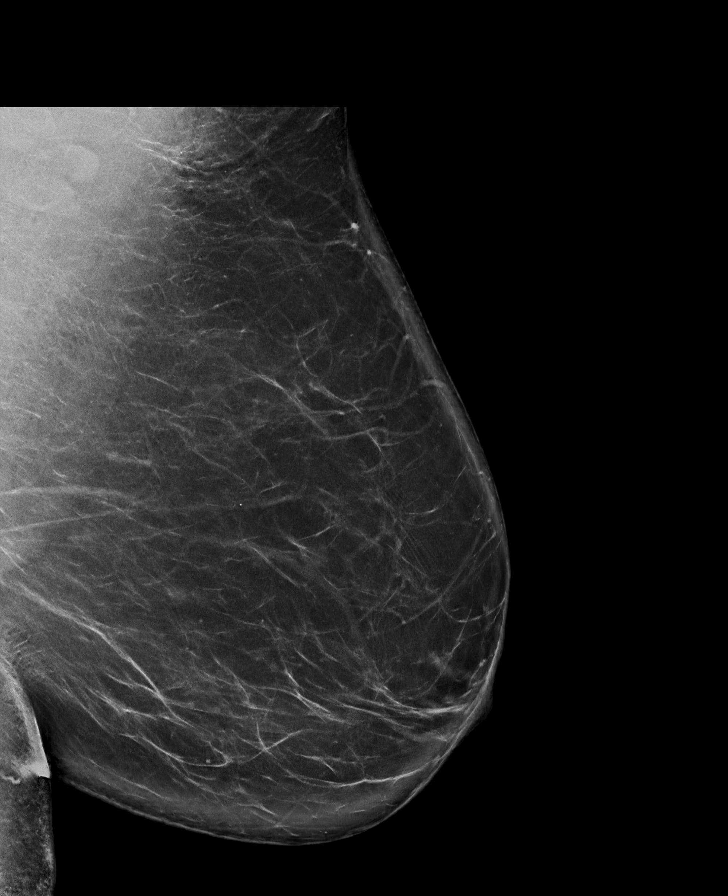

[R CC synth-2D]
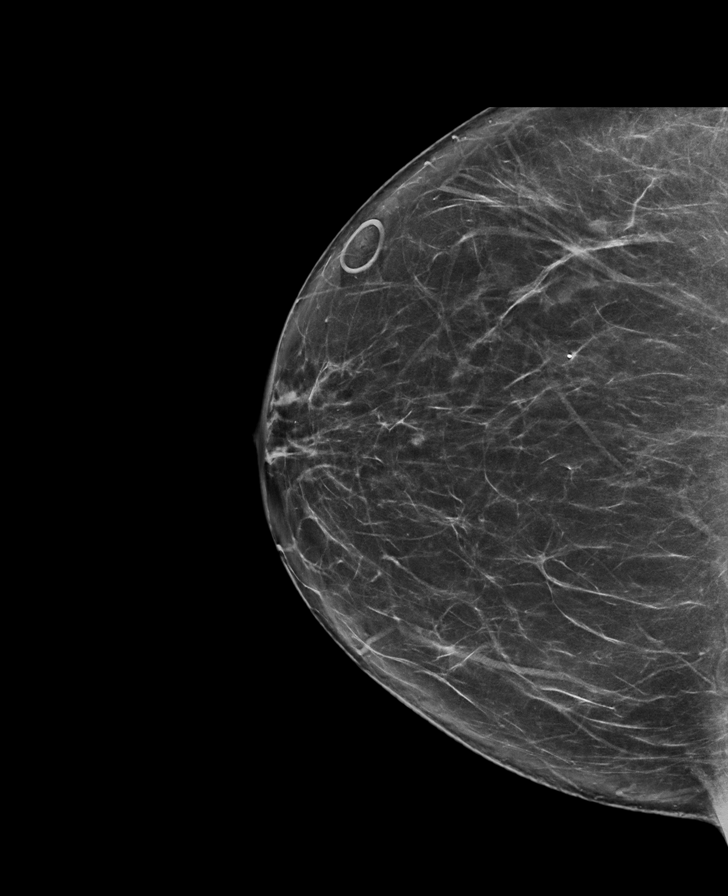

[R MLO synth-2D]
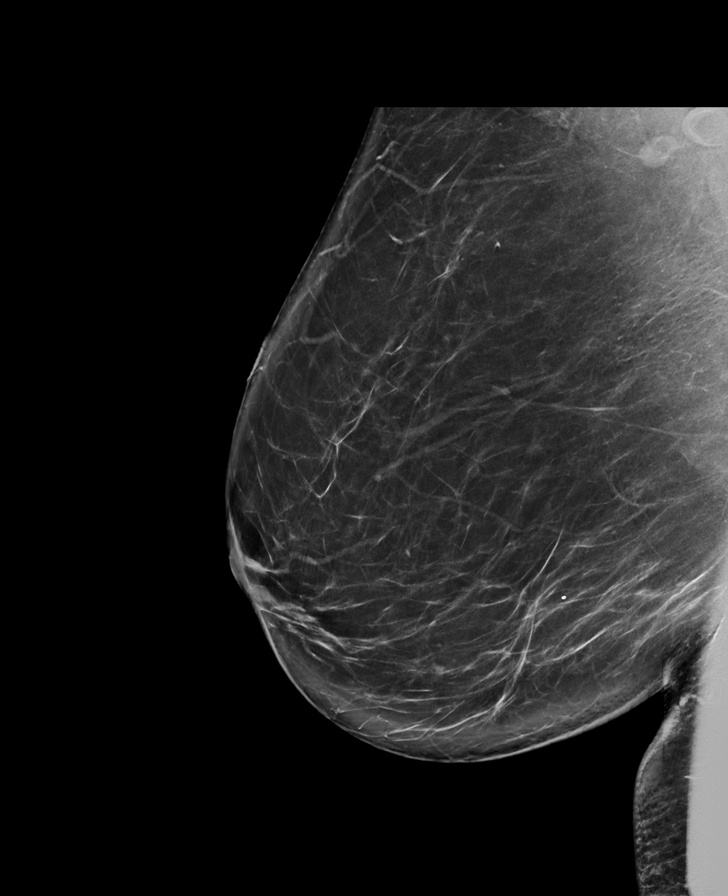

[L CC synth-2D]
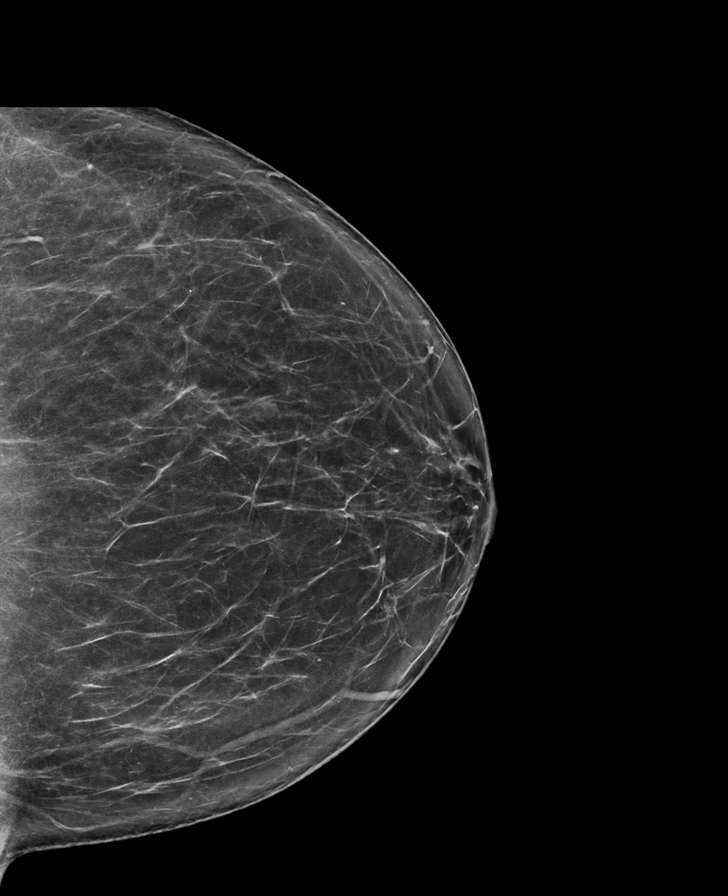

[R CC tomo · tomo slice 43/84.0]
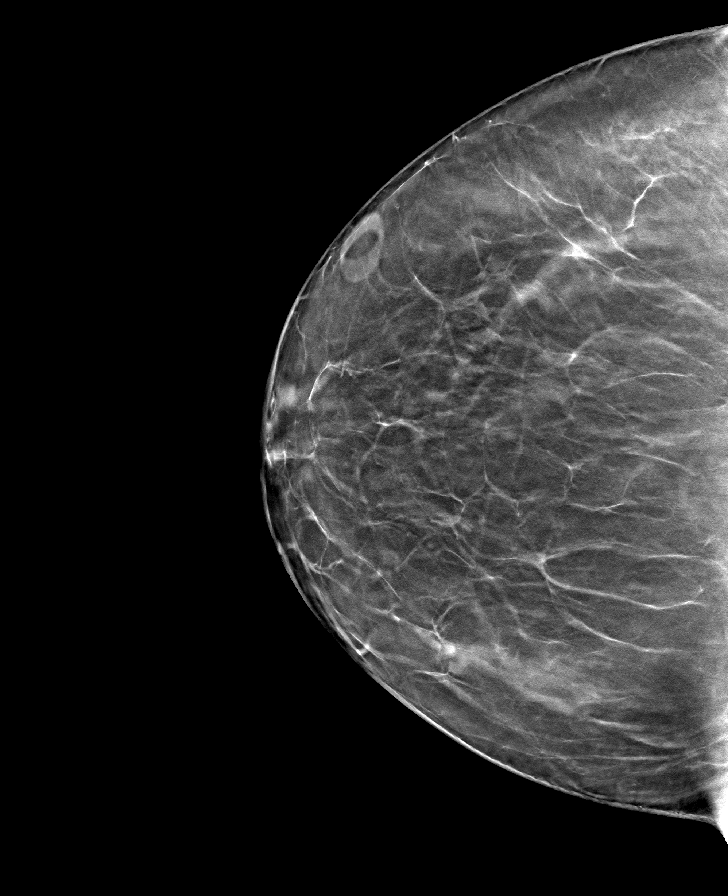

[L CC tomo · tomo slice 43/85.0]
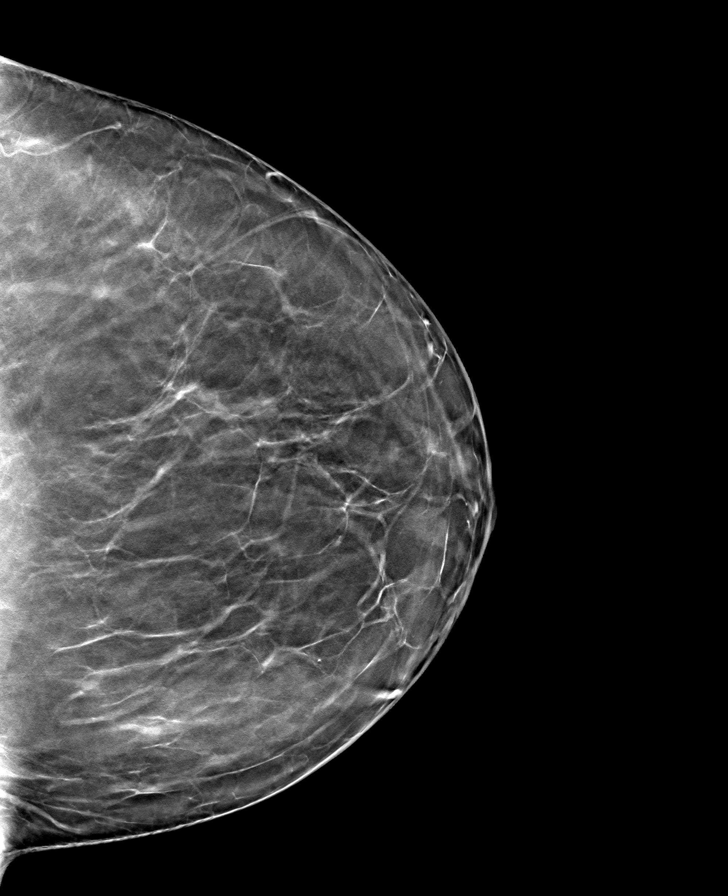

[L MLO tomo · tomo slice 53/105.0]
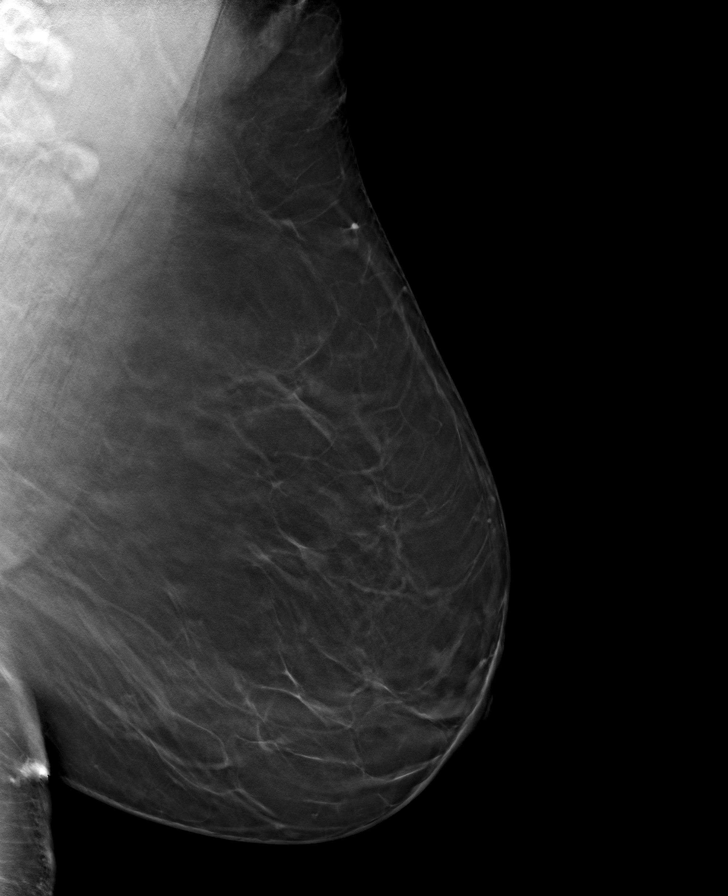

[R MLO tomo · tomo slice 51/100.0]
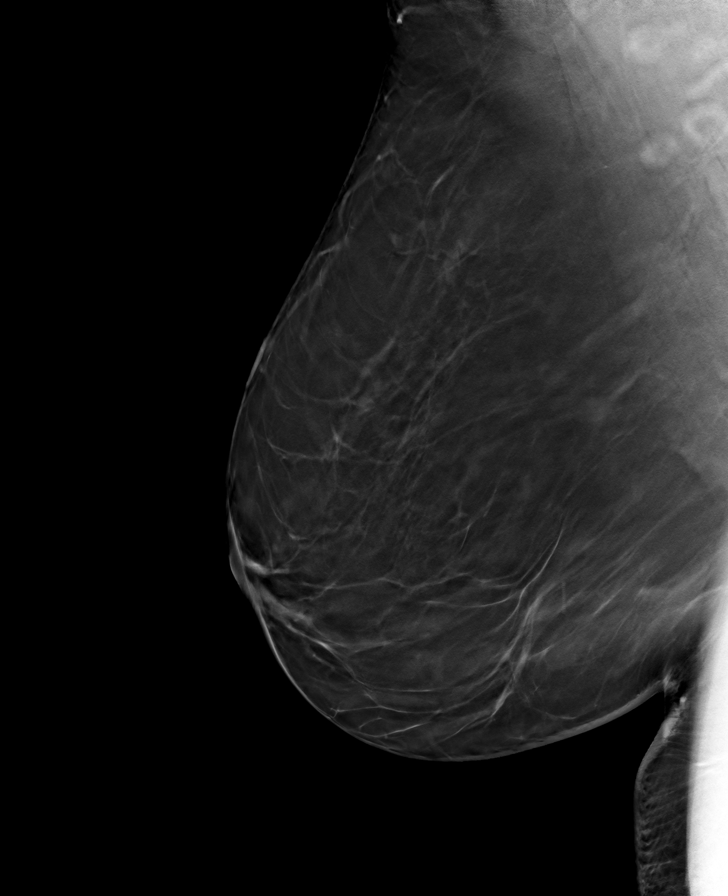

[8 of 24 positions shown; findings below may reference images not displayed]

ACR Breast Density Category b: There are scattered areas of
fibroglandular density.
FINDINGS: There are no findings suspicious for malignancy.
IMPRESSION: No mammographic evidence of malignancy. A result letter of this
screening mammogram will be mailed directly to the patient.

RECOMMENDATION:
Screening mammogram in one year. (Code:51-O-LD2)

BI-RADS CATEGORY  1: Negative.

## 2023-06-21 ENCOUNTER — Other Ambulatory Visit: Payer: Self-pay | Admitting: Nurse Practitioner

## 2023-08-18 ENCOUNTER — Encounter: Payer: Self-pay | Admitting: Pharmacist

## 2023-08-30 ENCOUNTER — Ambulatory Visit (INDEPENDENT_AMBULATORY_CARE_PROVIDER_SITE_OTHER): Payer: Medicare HMO | Admitting: Nurse Practitioner

## 2023-08-30 ENCOUNTER — Encounter: Payer: Self-pay | Admitting: Nurse Practitioner

## 2023-08-30 VITALS — BP 148/90 | HR 94 | Temp 97.6°F | Ht 68.08 in | Wt 247.0 lb

## 2023-08-30 DIAGNOSIS — Z23 Encounter for immunization: Secondary | ICD-10-CM | POA: Diagnosis not present

## 2023-08-30 DIAGNOSIS — Z0000 Encounter for general adult medical examination without abnormal findings: Secondary | ICD-10-CM | POA: Diagnosis not present

## 2023-08-30 NOTE — Progress Notes (Signed)
Subjective:   Michelle Campos is a 67 y.o. female who presents for Medicare Annual (Subsequent) preventive examination.  Visit Complete: In person at [facility]        Objective:    Today's Vitals   08/30/23 0826 08/30/23 0828  BP: (!) 150/92 (!) 148/90  Pulse: 94   Temp: 97.6 F (36.4 C)   TempSrc: Temporal   SpO2: 99%   Weight: 247 lb (112 kg)   Height: 5' 8.08" (1.729 m)    Body mass index is 37.47 kg/m.     08/30/2023    8:25 AM 03/22/2023    8:23 AM 09/18/2022    8:47 AM 08/24/2022    3:01 PM 08/18/2021    8:42 AM 08/07/2021    4:55 PM 07/21/2021   10:07 AM  Advanced Directives  Does Patient Have a Medical Advance Directive? No No No No No No No  Does patient want to make changes to medical advance directive?   No - Patient declined No - Patient declined     Would patient like information on creating a medical advance directive? Yes (MAU/Ambulatory/Procedural Areas - Information given) Yes (MAU/Ambulatory/Procedural Areas - Information given)   Yes (MAU/Ambulatory/Procedural Areas - Information given) No - Patient declined No - Patient declined    Current Medications (verified) Outpatient Encounter Medications as of 08/30/2023  Medication Sig   amLODipine (NORVASC) 10 MG tablet TAKE 1 TABLET DAILY   cetirizine (ZYRTEC) 10 MG tablet Take 1 tablet (10 mg total) by mouth at bedtime.   fluticasone (FLONASE) 50 MCG/ACT nasal spray Place 2 sprays into both nostrils daily as needed for allergies or rhinitis.   meclizine (ANTIVERT) 12.5 MG tablet Take 1 tablet (12.5 mg total) by mouth 3 (three) times daily as needed for dizziness.   Misc Natural Products (OSTEO BI-FLEX JOINT SHIELD PO) Take 1 tablet by mouth daily.   Multiple Vitamins-Minerals (MULTIVITAMIN ADULTS 50+ PO) Take by mouth daily.   St Johns Wort 300 MG CAPS Take 1 capsule by mouth daily.   triamterene-hydrochlorothiazide (MAXZIDE-25) 37.5-25 MG tablet TAKE 1 TABLET DAILY   atorvastatin (LIPITOR) 10 MG tablet Take 1  tablet (10 mg total) by mouth daily. (Patient not taking: Reported on 08/30/2023)   No facility-administered encounter medications on file as of 08/30/2023.    Allergies (verified) Skin adhesives [cyanoacrylate]   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HCC)    Superficial fungus infection of skin    on chest   Thyroid cancer (HCC) 2016   Thyroid mass    "2"   Past Surgical History:  Procedure Laterality Date   CESAREAN SECTION  1997   IRRIGATION AND DEBRIDEMENT SEBACEOUS CYST  ~ 2010   on back   PARATHYROIDECTOMY Right 05/27/2015   PARATHYROIDECTOMY Right 05/27/2015   Procedure: RIGHT PARATHYROIDECTOMY WITH FROZEN SECTION;  Surgeon: Serena Colonel, MD;  Location: [facility] OR;  Service: ENT;  Laterality: Right;   THYROID LOBECTOMY Right 05/27/2015   THYROIDECTOMY Right 05/27/2015   Procedure: RIGHT THYROID LOBECTOMY WITH FROZEN SECTION;  Surgeon: Serena Colonel, MD;  Location: [facility] OR;  Service: ENT;  Laterality: Right;   TUBAL LIGATION  1997   Family History  Problem Relation Age of Onset   Congestive Heart Failure Mother    Diabetes Mother 21   Hypertension Mother    Congestive Heart Failure Father    Multiple myeloma Brother 37   Diabetes Sister 10   Hypertension Sister    Hypertension Brother    Hypertension Brother    Hyperlipidemia Brother    Hypertension Brother    Hyperlipidemia Brother    Social History   Socioeconomic History   Marital status: Divorced    Spouse name: Not on file   Number of children: Not on file   Years of education: Not on file   Highest education level: Not on file  Occupational History   Not on file   Tobacco Use   Smoking status: Former    Current packs/day: 0.25    Average packs/day: 0.3 packs/day for 4.0 years (1.0 ttl pk-yrs)    Types: Cigarettes   Smokeless tobacco: Never   Tobacco comments:    quit smoking cigarettes in the 1970's  Vaping Use   Vaping status: Never Used  Substance and Sexual Activity   Alcohol use: Yes    Comment: maybe once or twice a year   Drug use: No   Sexual activity: Yes  Other Topics Concern   Not on file  Social History Narrative   Social History      Diet?       Do you drink/eat things with caffeine? Yes, soft drinks, coffee (occasionally)      Marital status?           divorced                         What year were you married? 1992      Do you live in a house, apartment, assisted living, condo, trailer, etc.? house      Is it one or more stories? 2 stories      How many persons live in your home? 2      Do you have any pets in your home? (please list) no      Highest level of education completed? Some graduate studies      Current or past profession: Environmental health practitioner      Do you exercise?        yes                              Type & how often? 3 x week- bike, treadmill      Advanced Directives      Do you have a living will? no      Do you have a DNR form?    no                              If not, do you want to discuss one? no      Do you have signed POA/HPOA for forms? no      Functional Status      Do you have difficulty bathing or dressing yourself? no      Do you have difficulty preparing food or eating? no      Do you have difficulty managing your medications? no      Do you have difficulty managing your finances?  no  Do you have difficulty affording your medications?   Social Determinants of Health   Financial Resource Strain: Not on file  Food Insecurity: Not on file  Transportation Needs: Not on file  Physical Activity: Not on file  Stress: Not on file  Social Connections: Not on file     Tobacco Counseling Counseling given: Not Answered Tobacco comments: quit smoking cigarettes in the 1970's   Clinical Intake:                        Activities of Daily Living     No data to display          Patient Care Team: [name], NP as PCP - General (Geriatric Medicine)  Indicate any recent Medical Services you may have received from other than [facility] providers in the past year (date may be approximate).     Assessment:   This is a routine wellness examination for Suann.  Hearing/Vision screen Hearing Screening - Comments:: No hearing issues  Vision Screening - Comments:: Last eye exam less than 12 months ago. [facility]    Goals Addressed   None    Depression Screen    08/30/2023    8:25 AM 03/30/2023    1:51 PM 03/22/2023    8:22 AM 08/24/2022    3:03 PM 08/18/2021    8:40 AM 06/03/2021    9:28 AM 01/17/2020    9:40 AM  PHQ 2/9 Scores  PHQ - 2 Score 0 0 0 0 0 0 0    Fall Risk    08/30/2023    8:24 AM 03/30/2023    1:50 PM 03/22/2023    8:22 AM 09/18/2022    8:47 AM 08/24/2022    3:02 PM  Fall Risk   Falls in the past year? 0 0 0 0 0  Number falls in past yr: 0 0 0 0 0  Injury with Fall? 0 0 0 0 0  Risk for fall due to : No Fall Risks No Fall Risks No Fall Risks No Fall Risks No Fall Risks  Follow up Falls evaluation completed  Falls evaluation completed Falls evaluation completed     MEDICARE RISK AT HOME:    TIMED UP AND GO:  Was the test performed?  No    Cognitive Function:    08/30/2023    8:29 AM  MMSE - Mini Mental State Exam  Orientation to time 4  Orientation to Place 5  Registration 3  Attention/ Calculation 5  Recall 2  Language- name 2 objects 2  Language- repeat 1  Language- follow 3 step command 3  Language- read & follow direction 1  Write a sentence 1  Copy design 1  Total score 28        08/24/2022    3:05 PM 06/03/2021    9:30 AM  6CIT Screen  What Year? 0 points 0 points   What month? 0 points 0 points  What time? 0 points 0 points  Count back from 20 0 points 0 points  Months in reverse 0 points 0 points  Repeat phrase 0 points 2 points  Total Score 0 points 2 points    Immunizations Immunization History  Administered Date(s) Administered   Fluad Quad(high Dose 65+) 08/18/2021, 09/18/2022   Influenza, Quadrivalent, Recombinant, Inj, Pf 08/24/2018   Influenza,inj,Quad PF,6+ Mos 07/25/2019, 07/19/2020   Influenza-Unspecified 08/24/2018   Moderna Sars-Covid-2 Vaccination 11/28/2020   PFIZER(Purple Top)SARS-COV-2 Vaccination 01/26/2020, 02/17/2020  PNEUMOCOCCAL CONJUGATE-20 09/04/2021   PPD Test 08/07/2019   Pneumococcal Conjugate-13 01/24/2021   Zoster Recombinant(Shingrix) 09/04/2021, 08/26/2022    TDAP status: Due, Education has been provided regarding the importance of this vaccine. Advised may receive this vaccine at local pharmacy or Health Dept. Aware to provide a copy of the vaccination record if obtained from local pharmacy or Health Dept. Verbalized acceptance and understanding.  Flu Vaccine status: Due, Education has been provided regarding the importance of this vaccine. Advised may receive this vaccine at local pharmacy or Health Dept. Aware to provide a copy of the vaccination record if obtained from local pharmacy or Health Dept. Verbalized acceptance and understanding.  Pneumococcal vaccine status: Up to date  Covid-19 vaccine status: Information provided on how to obtain vaccines.   Qualifies for Shingles Vaccine? Yes   Zostavax completed No   Shingrix Completed?: Yes  Screening Tests Health Maintenance  Topic Date Due   DTaP/Tdap/Td (1 - Tdap) Never done   INFLUENZA VACCINE  07/01/2023   COVID-19 Vaccine (4 - 2023-24 season) 08/01/2023   Medicare Annual Wellness (AWV)  08/29/2024   MAMMOGRAM  10/27/2024   Fecal DNA (Cologuard)  04/28/2025   DEXA SCAN  07/10/2026   Pneumonia Vaccine 66+ Years old  Completed   Hepatitis C  Screening  Completed   Zoster Vaccines- Shingrix  Completed   HPV VACCINES  Aged Out    Health Maintenance  Health Maintenance Due  Topic Date Due   DTaP/Tdap/Td (1 - Tdap) Never done   INFLUENZA VACCINE  07/01/2023   COVID-19 Vaccine (4 - 2023-24 season) 08/01/2023    Colorectal cancer screening: Type of screening: Cologuard. Completed 2023. Repeat every 3 years  Mammogram status: Completed 09/2022. Repeat every year  Bone Density status: Completed 2022. Results reflect: Bone density results: NORMAL. Repeat every 5 years.  Lung Cancer Screening: (Low Dose CT Chest recommended if Age 44-80 years, 20 pack-year currently smoking OR have quit w/in 15years.) does not qualify.   Lung Cancer Screening Referral: na  Additional Screening:  Hepatitis C Screening: does qualify; Completed   Vision Screening: Recommended annual ophthalmology exams for early detection of glaucoma and other disorders of the eye. Is the patient up to date with their annual eye exam?  Yes  Who is the provider or what is the name of the office in which the patient attends annual eye exams? [facility] If pt is not established with a provider, would they like to be referred to a provider to establish care? No .   Dental Screening: Recommended annual dental exams for proper oral hygiene  Community Resource Referral / Chronic Care Management: CRR required this visit?  No   CCM required this visit?  No     Plan:     I have personally reviewed and noted the following in the patient's chart:   Medical and social history Use of alcohol, tobacco or illicit drugs  Current medications and supplements including opioid prescriptions. Patient is not currently taking opioid prescriptions. Functional ability and status Nutritional status Physical activity Advanced directives List of other physicians Hospitalizations, surgeries, and ER visits in previous 12 months Vitals Screenings to include cognitive,  depression, and falls Referrals and appointments  In addition, I have reviewed and discussed with patient certain preventive protocols, quality metrics, and best practice recommendations. A written personalized care plan for preventive services as well as general preventive health recommendations were provided to patient.     [name], NP   08/30/2023

## 2023-08-30 NOTE — Patient Instructions (Addendum)
  Michelle Campos , Thank you for taking time to come for your Medicare Wellness Visit. I appreciate your ongoing commitment to your health goals. Please review the following plan we discussed and let me know if I can assist you in the future.   This is a list of the screening recommended for you and due dates:  Health Maintenance  Topic Date Due   DTaP/Tdap/Td vaccine (1 - Tdap) Never done   Flu Shot  07/01/2023   COVID-19 Vaccine (4 - 2023-24 season) 08/01/2023   Medicare Annual Wellness Visit  08/29/2024   Mammogram  10/27/2024   Cologuard (Stool DNA test)  04/28/2025   DEXA scan (bone density measurement)  07/10/2026   Pneumonia Vaccine  Completed   Hepatitis C Screening  Completed   Zoster (Shingles) Vaccine  Completed   HPV Vaccine  Aged Out    To get TDAP and covid booster at local pharmacy  If you have had TDAP please send [place] the records.  To call office for flu shot or get at pharmacy   Make appt for mammogram in November.

## 2023-09-17 ENCOUNTER — Ambulatory Visit (INDEPENDENT_AMBULATORY_CARE_PROVIDER_SITE_OTHER): Payer: Medicare HMO | Admitting: Nurse Practitioner

## 2023-09-17 ENCOUNTER — Encounter: Payer: Self-pay | Admitting: Nurse Practitioner

## 2023-09-17 VITALS — BP 126/78 | HR 85 | Temp 96.4°F | Ht 68.08 in | Wt 245.0 lb

## 2023-09-17 DIAGNOSIS — E782 Mixed hyperlipidemia: Secondary | ICD-10-CM

## 2023-09-17 DIAGNOSIS — E66812 Obesity, class 2: Secondary | ICD-10-CM

## 2023-09-17 DIAGNOSIS — I10 Essential (primary) hypertension: Secondary | ICD-10-CM

## 2023-09-17 DIAGNOSIS — L918 Other hypertrophic disorders of the skin: Secondary | ICD-10-CM

## 2023-09-17 DIAGNOSIS — J302 Other seasonal allergic rhinitis: Secondary | ICD-10-CM | POA: Diagnosis not present

## 2023-09-17 DIAGNOSIS — Z6839 Body mass index (BMI) 39.0-39.9, adult: Secondary | ICD-10-CM | POA: Diagnosis not present

## 2023-09-17 DIAGNOSIS — R739 Hyperglycemia, unspecified: Secondary | ICD-10-CM

## 2023-09-17 NOTE — Progress Notes (Signed)
Careteam: Patient Care Team: [name], NP as PCP - General (Geriatric Medicine)  PLACE OF SERVICE:  [facility]  Advanced Directive information    Allergies  Allergen Reactions   Skin Adhesives [Cyanoacrylate] Itching    Chief Complaint  Patient presents with   Medical Management of Chronic Issues    Patient presents today for a 6 month follow-up   Quality Metric Gaps    TDAP, COVID#6, Flu     HPI: Patient is a 67 y.o. female for routine follow up  Has plans this weekend so will get flu vaccine later.   Doing exercise and cutting back on fats (butter, eggs and cheese) - not taking lipitor. Did not realize it was it was at [facility]- would like to avoid taking any additional medication.   Having more sinus drainage- gets in fall and spring.  No abnormal aches or pains.   Sleeping well, mood is good.   She is working from home.   Has skin tags on her neck- not bothersome. Do not bleed.   Had hx of lipoma, one on wrist.    Review of Systems:  Review of Systems  Constitutional:  Negative for chills, fever and weight loss.  HENT:  Negative for tinnitus.   Respiratory:  Negative for cough, sputum production and shortness of breath.   Cardiovascular:  Negative for chest pain, palpitations and leg swelling.  Gastrointestinal:  Negative for abdominal pain, constipation, diarrhea and heartburn.  Genitourinary:  Negative for dysuria, frequency and urgency.  Musculoskeletal:  Negative for back pain, falls, joint pain and myalgias.  Skin: Negative.   Neurological:  Negative for dizziness and headaches.  Psychiatric/Behavioral:  Negative for depression and memory loss. The patient does not have insomnia.     Past Medical History:  Diagnosis Date   Arthritis    "right knee" (05/27/2015)   Family history of adverse reaction to anesthesia    "it's hard to bring my father back; more than once too" (05/27/2015)   GERD (gastroesophageal reflux disease)     Hypercalcemia    parathyroid gland removed   Hypertension    controlled with medication   Obesity (BMI 30-39.9)    Parathyroid cyst (HCC)    Superficial fungus infection of skin    on chest   Thyroid cancer (HCC) 2016   Thyroid mass    "2"   Past Surgical History:  Procedure Laterality Date   CESAREAN SECTION  1997   IRRIGATION AND DEBRIDEMENT SEBACEOUS CYST  ~ 2010   on back   PARATHYROIDECTOMY Right 05/27/2015   PARATHYROIDECTOMY Right 05/27/2015   Procedure: RIGHT PARATHYROIDECTOMY WITH FROZEN SECTION;  Surgeon: Serena Colonel, MD;  Location: [facility];  Service: ENT;  Laterality: Right;   THYROID LOBECTOMY Right 05/27/2015   THYROIDECTOMY Right 05/27/2015   Procedure: RIGHT THYROID LOBECTOMY WITH FROZEN SECTION;  Surgeon: Serena Colonel, MD;  Location: [facility] OR;  Service: ENT;  Laterality: Right;   TUBAL LIGATION  1997   Social History:   reports that she has quit smoking. Her smoking use included cigarettes. She has a 1 pack-year smoking history. She has never used smokeless tobacco. She reports current alcohol use. She reports that she does not use drugs.  Family History  Problem Relation Age of Onset   Congestive Heart Failure Mother    Diabetes Mother 48   Hypertension Mother    Congestive Heart Failure Father    Multiple myeloma Brother 47   Diabetes Sister 22  Hypertension Sister    Hypertension Brother    Hypertension Brother    Hyperlipidemia Brother    Hypertension Brother    Hyperlipidemia Brother     Medications: Patient's Medications  New Prescriptions   No medications on file  Previous Medications   AMLODIPINE (NORVASC) 10 MG TABLET    TAKE 1 TABLET DAILY   ATORVASTATIN (LIPITOR) 10 MG TABLET    Take 1 tablet (10 mg total) by mouth daily.   CETIRIZINE (ZYRTEC) 10 MG TABLET    Take 1 tablet (10 mg total) by mouth at bedtime.   FLUTICASONE (FLONASE) 50 MCG/ACT NASAL SPRAY    Place 2 sprays into both nostrils daily as needed for allergies or rhinitis.   MECLIZINE  (ANTIVERT) 12.5 MG TABLET    Take 1 tablet (12.5 mg total) by mouth 3 (three) times daily as needed for dizziness.   MISC NATURAL PRODUCTS (OSTEO BI-FLEX JOINT SHIELD PO)    Take 1 tablet by mouth daily.   MULTIPLE VITAMINS-MINERALS (MULTIVITAMIN ADULTS 50+ PO)    Take by mouth daily.   ST JOHNS WORT 300 MG CAPS    Take 1 capsule by mouth daily.   TRIAMTERENE-HYDROCHLOROTHIAZIDE (MAXZIDE-25) 37.5-25 MG TABLET    TAKE 1 TABLET DAILY  Modified Medications   No medications on file  Discontinued Medications   No medications on file    Physical Exam:  Vitals:   09/17/23 0821  BP: 126/78  Pulse: 85  Temp: (!) 96.4 F (35.8 C)  SpO2: 97%  Weight: 254 lb 3.2 oz (115.3 kg)  Height: 5' 8.08" (1.729 m)   Body mass index is 38.56 kg/m. Wt Readings from Last 3 Encounters:  09/17/23 254 lb 3.2 oz (115.3 kg)  08/30/23 247 lb (112 kg)  03/30/23 253 lb 6 oz (114.9 kg)    Physical Exam Constitutional:      General: She is not in acute distress.    Appearance: She is well-developed. She is not diaphoretic.  HENT:     Head: Normocephalic and atraumatic.     Mouth/Throat:     Pharynx: No oropharyngeal exudate.  Eyes:     Conjunctiva/sclera: Conjunctivae normal.     Pupils: Pupils are equal, round, and reactive to light.  Cardiovascular:     Rate and Rhythm: Normal rate and regular rhythm.     Heart sounds: Normal heart sounds.  Pulmonary:     Effort: Pulmonary effort is normal.     Breath sounds: Normal breath sounds.  Abdominal:     General: Bowel sounds are normal.     Palpations: Abdomen is soft.  Musculoskeletal:     Cervical back: Normal range of motion and neck supple.     Right lower leg: No edema.     Left lower leg: No edema.  Skin:    General: Skin is warm and dry.  Neurological:     Mental Status: She is alert.  Psychiatric:        Mood and Affect: Mood normal.     Labs reviewed: Basic Metabolic Panel: Recent Labs    09/18/22 0910 03/22/23 0858  NA 141 143   K 4.0 4.2  CL 103 103  CO2 32 33*  GLUCOSE 100* 117*  BUN 12 12  CREATININE 0.75 0.71  CALCIUM 9.6 10.0   Liver Function Tests: Recent Labs    09/18/22 0910 03/22/23 0858  AST 17 21  ALT 16 23  BILITOT 0.6 0.5  PROT 7.4 7.9   No results  for input(s): "LIPASE", "AMYLASE" in the last 8760 hours. No results for input(s): "AMMONIA" in the last 8760 hours. CBC: Recent Labs    09/18/22 0910 03/22/23 0858  WBC 4.6 4.4  NEUTROABS 2,636 2,512  HGB 14.0 13.9  HCT 42.7 43.1  MCV 82.3 83.9  PLT 283 279   Lipid Panel: Recent Labs    09/18/22 0910 03/22/23 0858  CHOL 180 196  HDL 46* 49*  LDLCALC 118* 811*  TRIG 72 75  CHOLHDL 3.9 4.0   TSH: No results for input(s): "TSH" in the last 8760 hours. A1C: Lab Results  Component Value Date   HGBA1C 6.0 (H) 03/22/2023     Assessment/Plan 1. Essential hypertension -Blood pressure well controlled, goal bp <140/90 Continue current medications and dietary modifications follow metabolic panel - Complete Metabolic Panel with eGFR - CBC with Differential/Platelet  2. Class 2 severe obesity due to excess calories with serious comorbidity and body mass index (BMI) of 39.0 to 39.9 in adult [facility]) -education provided on healthy weight loss through increase in physical activity and proper nutrition   3. Seasonal allergies Continues zyrtec   4. Hyperglycemia -continue dietary modifications.  - Hemoglobin A1c  5. Mixed hyperlipidemia Did not start her lipitor but made dietary modifications. Will follow up labs today  - Complete Metabolic Panel with eGFR - CBC with Differential/Platelet - Lipid Panel  6. Skin tag Noted to neck, not bothersome at this time. Will monitor.    Return in about 6 months (around 03/17/2024) for routine follow up, with labs at appt .  Janene Harvey. Biagio Borg [facility] [phone]

## 2023-09-18 LAB — COMPLETE METABOLIC PANEL WITH GFR
AG Ratio: 1.4 (calc) (ref 1.0–2.5)
ALT: 15 U/L (ref 6–29)
AST: 17 U/L (ref 10–35)
Albumin: 4.4 g/dL (ref 3.6–5.1)
Alkaline phosphatase (APISO): 109 U/L (ref 37–153)
BUN: 12 mg/dL (ref 7–25)
CO2: 31 mmol/L (ref 20–32)
Calcium: 9.8 mg/dL (ref 8.6–10.4)
Chloride: 103 mmol/L (ref 98–110)
Creat: 0.74 mg/dL (ref 0.50–1.05)
Globulin: 3.1 g/dL (ref 1.9–3.7)
Glucose, Bld: 94 mg/dL (ref 65–99)
Potassium: 3.7 mmol/L (ref 3.5–5.3)
Sodium: 142 mmol/L (ref 135–146)
Total Bilirubin: 0.7 mg/dL (ref 0.2–1.2)
Total Protein: 7.5 g/dL (ref 6.1–8.1)
eGFR: 89 mL/min/{1.73_m2} (ref >=60)

## 2023-09-18 LAB — LIPID PANEL
Cholesterol: 162 mg/dL (ref <200)
HDL: 43 mg/dL — ABNORMAL LOW (ref >=50)
LDL Cholesterol (Calc): 104 mg/dL — ABNORMAL HIGH
Non-HDL Cholesterol (Calc): 119 mg/dL (ref <130)
Total CHOL/HDL Ratio: 3.8 (calc) (ref <5.0)
Triglycerides: 61 mg/dL (ref <150)

## 2023-09-18 LAB — HEMOGLOBIN A1C
Hgb A1c MFr Bld: 5.8 %{Hb} — ABNORMAL HIGH (ref <5.7)
Mean Plasma Glucose: 120 mg/dL
eAG (mmol/L): 6.6 mmol/L

## 2023-09-18 LAB — CBC WITH DIFFERENTIAL/PLATELET
Absolute Lymphocytes: 1240 {cells}/uL (ref 850–3900)
Absolute Monocytes: 320 {cells}/uL (ref 200–950)
Basophils Absolute: 31 {cells}/uL (ref 0–200)
Basophils Relative: 0.8 %
Eosinophils Absolute: 191 {cells}/uL (ref 15–500)
Eosinophils Relative: 4.9 %
HCT: 43.9 % (ref 35.0–45.0)
Hemoglobin: 13.7 g/dL (ref 11.7–15.5)
MCH: 26.8 pg — ABNORMAL LOW (ref 27.0–33.0)
MCHC: 31.2 g/dL — ABNORMAL LOW (ref 32.0–36.0)
MCV: 85.7 fL (ref 80.0–100.0)
MPV: 12.4 fL (ref 7.5–12.5)
Monocytes Relative: 8.2 %
Neutro Abs: 2118 {cells}/uL (ref 1500–7800)
Neutrophils Relative %: 54.3 %
Platelets: 277 10*3/uL (ref 140–400)
RBC: 5.12 10*6/uL — ABNORMAL HIGH (ref 3.80–5.10)
RDW: 14 % (ref 11.0–15.0)
Total Lymphocyte: 31.8 %
WBC: 3.9 10*3/uL (ref 3.8–10.8)

## 2023-11-11 DIAGNOSIS — Z1231 Encounter for screening mammogram for malignant neoplasm of breast: Secondary | ICD-10-CM

## 2023-12-01 DIAGNOSIS — C50919 Malignant neoplasm of unspecified site of unspecified female breast: Secondary | ICD-10-CM

## 2023-12-01 HISTORY — DX: Malignant neoplasm of unspecified site of unspecified female breast: C50.919

## 2024-01-25 ENCOUNTER — Telehealth: Payer: Self-pay | Admitting: Family Medicine

## 2024-01-25 NOTE — Telephone Encounter (Signed)
 Pt was scheduled with Dr. Casimiro Needle in error as an office visit. Pt needs a new pt appt. Called pt and left vm letting her know her appt was cancelled and to call us back so we can reschedule her for a new pt appt with Dr. Casimiro Needle.

## 2024-02-08 ENCOUNTER — Ambulatory Visit: Payer: Medicare HMO | Admitting: Family Medicine

## 2024-02-28 ENCOUNTER — Other Ambulatory Visit: Payer: Self-pay | Admitting: Nurse Practitioner

## 2024-02-28 DIAGNOSIS — I10 Essential (primary) hypertension: Secondary | ICD-10-CM

## 2024-02-29 ENCOUNTER — Other Ambulatory Visit: Payer: Self-pay | Admitting: Nurse Practitioner

## 2024-02-29 ENCOUNTER — Ambulatory Visit
Admission: RE | Admit: 2024-02-29 | Discharge: 2024-02-29 | Disposition: A | Discharge status: Home / Self Care | Payer: Medicare (Managed Care) | Source: Ambulatory Visit | Attending: Nurse Practitioner | Admitting: Nurse Practitioner

## 2024-02-29 ENCOUNTER — Ambulatory Visit
Admission: RE | Admit: 2024-02-29 | Discharge: 2024-02-29 | Disposition: A | Discharge status: Home / Self Care | Payer: Medicare HMO | Source: Ambulatory Visit | Attending: Nurse Practitioner | Admitting: Nurse Practitioner

## 2024-02-29 DIAGNOSIS — N6320 Unspecified lump in the left breast, unspecified quadrant: Secondary | ICD-10-CM

## 2024-02-29 DIAGNOSIS — N6325 Unspecified lump in the left breast, overlapping quadrants: Secondary | ICD-10-CM | POA: Diagnosis not present

## 2024-02-29 DIAGNOSIS — N6310 Unspecified lump in the right breast, unspecified quadrant: Secondary | ICD-10-CM

## 2024-02-29 DIAGNOSIS — N6315 Unspecified lump in the right breast, overlapping quadrants: Secondary | ICD-10-CM | POA: Diagnosis not present

## 2024-03-01 ENCOUNTER — Other Ambulatory Visit: Payer: Self-pay | Admitting: Nurse Practitioner

## 2024-03-01 DIAGNOSIS — N6310 Unspecified lump in the right breast, unspecified quadrant: Secondary | ICD-10-CM

## 2024-03-03 ENCOUNTER — Ambulatory Visit
Admission: RE | Admit: 2024-03-03 | Discharge: 2024-03-03 | Disposition: A | Discharge status: Home / Self Care | Payer: Medicare (Managed Care) | Source: Ambulatory Visit | Attending: Nurse Practitioner | Admitting: Nurse Practitioner

## 2024-03-03 DIAGNOSIS — N6315 Unspecified lump in the right breast, overlapping quadrants: Secondary | ICD-10-CM | POA: Diagnosis not present

## 2024-03-03 DIAGNOSIS — R92321 Mammographic fibroglandular density, right breast: Secondary | ICD-10-CM | POA: Diagnosis not present

## 2024-03-03 DIAGNOSIS — D0511 Intraductal carcinoma in situ of right breast: Secondary | ICD-10-CM | POA: Diagnosis not present

## 2024-03-03 DIAGNOSIS — N6310 Unspecified lump in the right breast, unspecified quadrant: Secondary | ICD-10-CM

## 2024-03-03 HISTORY — PX: BREAST BIOPSY: SHX20

## 2024-03-07 LAB — SURGICAL PATHOLOGY

## 2024-03-17 ENCOUNTER — Ambulatory Visit: Payer: Medicare HMO | Admitting: Nurse Practitioner

## 2024-03-20 ENCOUNTER — Other Ambulatory Visit: Payer: Self-pay | Admitting: General Surgery

## 2024-03-20 ENCOUNTER — Ambulatory Visit: Payer: Medicare HMO | Admitting: Nurse Practitioner

## 2024-03-20 DIAGNOSIS — Z809 Family history of malignant neoplasm, unspecified: Secondary | ICD-10-CM | POA: Insufficient documentation

## 2024-03-20 DIAGNOSIS — Z170 Estrogen receptor positive status [ER+]: Secondary | ICD-10-CM

## 2024-03-20 DIAGNOSIS — C50411 Malignant neoplasm of upper-outer quadrant of right female breast: Secondary | ICD-10-CM | POA: Diagnosis not present

## 2024-03-21 ENCOUNTER — Encounter: Payer: Self-pay | Admitting: Family

## 2024-03-21 ENCOUNTER — Telehealth: Payer: Self-pay | Admitting: Radiation Oncology

## 2024-03-21 ENCOUNTER — Ambulatory Visit (INDEPENDENT_AMBULATORY_CARE_PROVIDER_SITE_OTHER): Payer: Medicare (Managed Care) | Admitting: Family

## 2024-03-21 VITALS — BP 150/96 | HR 93 | Temp 97.8°F | Resp 20 | Ht 68.0 in | Wt 236.0 lb

## 2024-03-21 DIAGNOSIS — F411 Generalized anxiety disorder: Secondary | ICD-10-CM

## 2024-03-21 DIAGNOSIS — I10 Essential (primary) hypertension: Secondary | ICD-10-CM | POA: Diagnosis not present

## 2024-03-21 MED ORDER — ALPRAZOLAM 0.25 MG PO TABS
0.2500 mg | ORAL_TABLET | Freq: Two times a day (BID) | ORAL | 0 refills | Status: AC | PRN
Start: 2024-03-21 — End: ?

## 2024-03-21 NOTE — Telephone Encounter (Signed)
 4/22 @ 11:22 am Left voicemail for patient to call our office to be schedule for consult.

## 2024-03-21 NOTE — Progress Notes (Signed)
 Provider: Christean Courts FNP-C  Yomara Toothman, Elijio Guadeloupe, NP  Patient Care Team: Bard Haupert, Elijio Guadeloupe, NP as PCP - General (Family Medicine)  Extended Emergency Contact Information Primary Emergency Contact: Purcell,Tamara Address: [address]          [place], [place] [address] [place] Home Phone: [phone] Mobile Phone: [phone] Relation: Sister  Code Status: Full Code  Goals of care: Advanced Directive information    03/21/2024    2:39 PM  Advanced Directives  Does Patient Have a Medical Advance Directive? No  Would patient like information on creating a medical advance directive? No - Patient declined     Chief Complaint  Patient presents with   Acute Visit    Anxiety and blood pressure.    Discussed the use of AI scribe software for clinical note transcription with the patient, who gave verbal consent to proceed.  History of Present Illness   Annalise Mcdiarmid is a 68 year old female with breast cancer who presents with anxiety and elevated blood pressure.  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 The diagnosis has caused significant stress, affecting her sleep and causing persistent anxiety. She is scheduled for a lumpectomy and possibly radiation therapy.  She experiences overwhelming anxiety that affects her daily life, with increased anxiety in situations such as driving at night and using elevators. She has a history of panic attacks and has not sought counseling or therapy for her anxiety. She feels hot and has difficulty managing her anxiety, which is exacerbated by her recent cancer diagnosis.  Her blood pressure has been consistently high, with recent readings of 156/98 and 178. She is currently taking amlodipine  10 mg and triamterene /hydrochlorothiazide  for hypertension. She attributes some of the elevation in her blood pressure to anxiety related to her cancer diagnosis.  She has  made lifestyle changes, including diet and exercise, to manage her health. She swims four days a week, has lost 15-20 pounds, and has improved her cholesterol levels through dietary changes, avoiding butter, cheese, and eggs. She has not taken atorvastatin  due to concerns about side effects and has managed her cholesterol through diet alone.  She experiences ringing in her ears, particularly at night, which she describes as a noise sensation. This symptom has developed in the last few weeks. She also reports a history of vertigo but has not experienced it recently.  She is not currently taking Flonase , osteo Bioflex, or meclizine . She has a history of using St. John's Wort for hot flashes and continues to take it. She expresses concern about taking new medications, particularly those that may cause dependency.   Past Medical History:  Diagnosis Date   Arthritis    "right knee" (05/27/2015)   Breast cancer (HCC) 2025   Family history of adverse reaction to anesthesia    "it's hard to bring my father back; more than once too" (05/27/2015)   GERD (gastroesophageal reflux disease)    Hypercalcemia    parathyroid  gland removed   Hypertension    controlled with medication   Obesity (BMI 30-39.9)    Parathyroid  cyst (HCC)    Superficial fungus infection of skin    on chest   Thyroid  cancer (HCC) 2016   Thyroid  mass    "2"   Past Surgical History:  Procedure Laterality Date   BREAST BIOPSY Right 03/03/2024   US  RT BREAST BX W LOC DEV 1ST LESION IMG BX  SPEC US  GUIDE 03/03/2024 [facility]MO[facility]AP[facility]   CESAREAN SECTION  12/01/1995   IRRIGATION AND DEBRIDEMENT SEBACEOUS CYST  ~ 2010   on back   PARATHYROIDECTOMY Right 05/27/2015   PARATHYROIDECTOMY Right 05/27/2015   Procedure: RIGHT PARATHYROIDECTOMY WITH FROZEN SECTION;  Surgeon: Janita Mellow, MD;  Location: [facility] OR;  Service: ENT;  Laterality: Right;   THYROID  LOBECTOMY Right 05/27/2015   THYROIDECTOMY Right 05/27/2015   Procedure: RIGHT THYROID   LOBECTOMY WITH FROZEN SECTION;  Surgeon: Janita Mellow, MD;  Location: [facility] OR;  Service: ENT;  Laterality: Right;   TUBAL LIGATION  12/01/1995    Allergies  Allergen Reactions   Skin Adhesives [Cyanoacrylate] Itching    Outpatient Encounter Medications as of 03/21/2024  Medication Sig   ALPRAZolam  (XANAX ) 0.25 MG tablet Take 1 tablet (0.25 mg total) by mouth 2 (two) times daily as needed for anxiety.   amLODipine  (NORVASC ) 10 MG tablet TAKE 1 TABLET DAILY   cetirizine  (ZYRTEC ) 10 MG tablet Take 1 tablet (10 mg total) by mouth at bedtime.   Multiple Vitamins-Minerals (MULTIVITAMIN ADULTS 50+ PO) Take by mouth daily.   St Johns Wort 300 MG CAPS Take 1 capsule by mouth daily.   triamterene -hydrochlorothiazide  (MAXZIDE -25) 37.5-25 MG tablet TAKE 1 TABLET DAILY   [DISCONTINUED] atorvastatin  (LIPITOR) 10 MG tablet Take 1 tablet (10 mg total) by mouth daily. (Patient not taking: Reported on 03/21/2024)   [DISCONTINUED] fluticasone  (FLONASE ) 50 MCG/ACT nasal spray Place 2 sprays into both nostrils daily as needed for allergies or rhinitis. (Patient not taking: Reported on 03/21/2024)   [DISCONTINUED] meclizine  (ANTIVERT ) 12.5 MG tablet Take 1 tablet (12.5 mg total) by mouth 3 (three) times daily as needed for dizziness. (Patient not taking: Reported on 03/21/2024)   [DISCONTINUED] Misc Natural Products (OSTEO BI-FLEX JOINT SHIELD PO) Take 1 tablet by mouth daily. (Patient not taking: Reported on 03/21/2024)   No facility-administered encounter medications on file as of 03/21/2024.    Review of Systems  Constitutional:  Negative for appetite change, chills, fatigue, fever and unexpected weight change.  Eyes:  Negative for pain, discharge, redness, itching and visual disturbance.  Respiratory:  Negative for cough, chest tightness, shortness of breath and wheezing.   Cardiovascular:  Negative for chest pain, palpitations and leg swelling.  Gastrointestinal:  Negative for abdominal distention, abdominal  pain, constipation, diarrhea, nausea and vomiting.  Musculoskeletal:  Negative for arthralgias, back pain, gait problem, joint swelling, myalgias, neck pain and neck stiffness.  Skin:  Negative for color change, pallor, rash and wound.  Neurological:  Negative for dizziness, weakness, light-headedness, numbness and headaches.  Psychiatric/Behavioral:  Negative for agitation, behavioral problems, confusion, hallucinations and sleep disturbance. The patient is nervous/anxious.     Immunization History  Administered Date(s) Administered   Fluad Quad(high Dose 65+) 08/18/2021, 09/18/2022   Influenza, Quadrivalent, Recombinant, Inj, Pf 08/24/2018   Influenza,inj,Quad PF,6+ Mos 07/25/2019, 07/19/2020   Influenza-Unspecified 08/24/2018   Moderna Sars-Covid-2 Vaccination 11/28/2020   PFIZER(Purple Top)SARS-COV-2 Vaccination 01/26/2020, 02/17/2020   PNEUMOCOCCAL CONJUGATE-20 09/04/2021   PPD Test 08/07/2019   Pneumococcal Conjugate-13 01/24/2021   Zoster Recombinant(Shingrix) 09/04/2021, 08/26/2022   Pertinent  Health Maintenance Due  Topic Date Due   INFLUENZA VACCINE  06/30/2024   MAMMOGRAM  02/28/2026   DEXA SCAN  07/10/2026      09/18/2022    8:47 AM 03/22/2023    8:22 AM 03/30/2023    1:50 PM 08/30/2023    8:24 AM 03/21/2024    2:39 PM  Fall Risk  Falls in the past  year? 0 0 0 0 0  Was there an injury with Fall? 0 0 0 0 0  Fall Risk Category Calculator 0 0 0 0 0  Fall Risk Category (Retired) Low      (RETIRED) Patient Fall Risk Level Low fall risk      Patient at Risk for Falls Due to No Fall Risks No Fall Risks No Fall Risks No Fall Risks No Fall Risks  Fall risk Follow up Falls evaluation completed Falls evaluation completed  Falls evaluation completed Falls evaluation completed   Functional Status Survey:    Vitals:   03/21/24 1444 03/21/24 1452  BP: (!) 156/98 (!) 150/96  Pulse: 93   Resp: 20   Temp: 97.8 F (36.6 C)   SpO2: 96%   Weight: 236 lb (107 kg)   Height: 5'  8" (1.727 m)    Body mass index is 35.88 kg/m. Physical Exam VITALS: BP- 156/98 GENERAL: Alert, cooperative, well developed, no acute distress. HEENT: Normocephalic, normal oropharynx, moist mucous membranes. NECK: Thyroid  normal. CHEST: Clear to auscultation bilaterally, no wheezes, rhonchi, or crackles. CARDIOVASCULAR: Normal heart rate and rhythm, S1 and S2 normal without murmurs. ABDOMEN: Soft, non-tender, non-distended, without organomegaly, normal bowel sounds. EXTREMITIES: No cyanosis or edema. NEUROLOGICAL: Cranial nerves grossly intact, moves all extremities without gross motor or sensory deficit.  SKIN: No rash,no lesion or erythema   PSYCHIATRY/BEHAVIORAL: Anxious   Labs reviewed: Recent Labs    09/17/23 0849  NA 142  K 3.7  CL 103  CO2 31  GLUCOSE 94  BUN 12  CREATININE 0.74  CALCIUM  9.8   Recent Labs    09/17/23 0849  AST 17  ALT 15  BILITOT 0.7  PROT 7.5   Recent Labs    09/17/23 0849  WBC 3.9  NEUTROABS 2,118  HGB 13.7  HCT 43.9  MCV 85.7  PLT 277   Lab Results  Component Value Date   TSH 1.96 02/13/2019   Lab Results  Component Value Date   HGBA1C 5.8 (H) 09/17/2023   Lab Results  Component Value Date   CHOL 162 09/17/2023   HDL 43 (L) 09/17/2023   LDLCALC 104 (H) 09/17/2023   TRIG 61 09/17/2023   CHOLHDL 3.8 09/17/2023    Significant Diagnostic Results in last 30 days:  US  RT BREAST BX W LOC DEV 1ST LESION IMG BX SPEC US  GUIDE Addendum Date: 03/08/2024 ADDENDUM REPORT: 03/08/2024 12:07 ADDENDUM: Pathology revealed DUCTAL CARCINOMA IN SITU, LOW-GRADE, INVOLVING A PAPILLOMA, NECROSIS: NOT IDENTIFIED, CALCIFICATIONS: NOT IDENTIFIED of the RIGHT breast, outer, 9 o'clock, 10cmfn, (ribbon clip). This was found to be concordant by Dr. Severiano Danish. Pathology results were discussed with the patient by telephone by Ladonna Pickup, RN Nurse Navigator. The patient reported doing well after the biopsy with tenderness at the site. Post biopsy instructions  and care were reviewed and questions were answered. The patient was encouraged to call [facility] for any additional concerns. Surgical consultation has been arranged with Dr. Lockie Rima at [facility] on March 20, 2024. Pathology results reported by Kraig Peru, RN on 03/08/2024. Electronically Signed   By: Sundra Engel M.D.   On: 03/08/2024 12:07   Result Date: 03/08/2024 CLINICAL DATA:  68 year old female presents for tissue sampling of 0.8 cm OUTER RIGHT breast mass. EXAM: ULTRASOUND GUIDED RIGHT BREAST CORE NEEDLE BIOPSY COMPARISON:  Previous exam(s). PROCEDURE: I met with the patient and we discussed the procedure of ultrasound-guided biopsy, including benefits and alternatives. We discussed the  high likelihood of a successful procedure. We discussed the risks of the procedure, including infection, bleeding, tissue injury, clip migration, and inadequate sampling. Informed written consent was given. The usual time-out protocol was performed immediately prior to the procedure. Using sterile technique and 1% Lidocaine  as local anesthetic, under direct ultrasound visualization, a 12 gauge spring-loaded device was used to perform biopsy of the 0.8 cm mass at the 9 o'clock position of the RIGHT breast 10 cm from the nipple using a LATERAL approach. At the conclusion of the procedure a RIBBON shaped tissue marker clip was deployed into the biopsy cavity. Follow up 2 view mammogram was performed and dictated separately. IMPRESSION: Ultrasound guided biopsy of 0.8 cm OUTER RIGHT breast mass. No apparent complications. Electronically Signed: By: Sundra Engel M.D. On: 03/03/2024 09:04   MM CLIP PLACEMENT RIGHT Result Date: 03/03/2024 CLINICAL DATA:  Evaluate biopsy clip placement following ultrasound-guided RIGHT breast biopsy. EXAM: 3D DIAGNOSTIC RIGHT MAMMOGRAM POST ULTRASOUND BIOPSY COMPARISON:  Previous exam(s). ACR Breast Density Category b: There are scattered areas of fibroglandular density.  FINDINGS: 3D Mammographic images were obtained following ultrasound guided biopsy of the 0.8 cm 9 o'clock position RIGHT breast mass. The biopsy marking clip is in expected position at the site of biopsy. IMPRESSION: Appropriate positioning of the RIBBON shaped biopsy marking clip at the site of biopsy in the OUTER RIGHT breast. Final Assessment: Post Procedure Mammograms for Marker Placement Electronically Signed   By: Sundra Engel M.D.   On: 03/03/2024 09:19   MM 3D DIAGNOSTIC MAMMOGRAM BILATERAL BREAST Result Date: 02/29/2024 CLINICAL DATA:  Patient presents for follow-up of a probably benign mass in the outer left breast, initially imaged December 2023. Patient is due for annual exam. No breast complaints today. EXAM: DIGITAL DIAGNOSTIC BILATERAL MAMMOGRAM WITH TOMOSYNTHESIS AND CAD; ULTRASOUND RIGHT BREAST LIMITED; ULTRASOUND LEFT BREAST LIMITED TECHNIQUE: Bilateral digital diagnostic mammography and breast tomosynthesis was performed. The images were evaluated with computer-aided detection. ; Targeted ultrasound examination of the right breast was performed; Targeted ultrasound examination of the left breast was performed. COMPARISON:  Previous exam(s). ACR Breast Density Category b: There are scattered areas of fibroglandular density. FINDINGS: LEFT breast: The previously described 0.9 cm mass in the outer left breast at middle depth is not present on today's imaging. No new suspicious mass, calcifications, or other findings in the left breast. Targeted ultrasound the left breast at the 3 o'clock position 4 cm from the nipple was performed. The previously documented 0.3 cm mass at this location is not present. RIGHT breast: A 0.6 cm oval circumscribed mass in the outer central right breast at posterior depth is new compared to prior exam. No suspicious calcifications or other findings of the right breast. Targeted ultrasound right breast at the 9 o'clock position 10 cm from the nipple demonstrates a 0.8 x  0.5 x 0.8 cm oval anechoic mass with possible fluid-debris level versus hypoechoic solid component. There is questionable internal blood flow within the hypoechoic component versus peripheral blood vessel. Targeted ultrasound of the right axilla demonstrates lymph nodes with normal morphology. IMPRESSION: 1. Indeterminate mass at the right breast 9 o'clock position may reflect a complicated cyst with fluid debris level versus complex solid and cystic mass. Recommend ultrasound-guided biopsy for definitive characterization. 2. No right axillary lymphadenopathy. 3. Interval resolution of the mass at the left breast 3 o'clock position. No mammographic findings of malignancy the left breast. RECOMMENDATION: Right breast ultrasound-guided biopsy. Patient will be contacted to schedule the procedure at her earliest  convenience. I have discussed the findings and recommendations with the patient. The biopsy procedure was discussed with the patient and questions were answered. Patient expressed their understanding of the biopsy recommendation. BI-RADS CATEGORY  4: Suspicious. Electronically Signed   By: Mercie Stalker M.D.   On: 02/29/2024 09:40   US  LIMITED ULTRASOUND INCLUDING AXILLA LEFT BREAST  Result Date: 02/29/2024 CLINICAL DATA:  Patient presents for follow-up of a probably benign mass in the outer left breast, initially imaged December 2023. Patient is due for annual exam. No breast complaints today. EXAM: DIGITAL DIAGNOSTIC BILATERAL MAMMOGRAM WITH TOMOSYNTHESIS AND CAD; ULTRASOUND RIGHT BREAST LIMITED; ULTRASOUND LEFT BREAST LIMITED TECHNIQUE: Bilateral digital diagnostic mammography and breast tomosynthesis was performed. The images were evaluated with computer-aided detection. ; Targeted ultrasound examination of the right breast was performed; Targeted ultrasound examination of the left breast was performed. COMPARISON:  Previous exam(s). ACR Breast Density Category b: There are scattered areas of fibroglandular  density. FINDINGS: LEFT breast: The previously described 0.9 cm mass in the outer left breast at middle depth is not present on today's imaging. No new suspicious mass, calcifications, or other findings in the left breast. Targeted ultrasound the left breast at the 3 o'clock position 4 cm from the nipple was performed. The previously documented 0.3 cm mass at this location is not present. RIGHT breast: A 0.6 cm oval circumscribed mass in the outer central right breast at posterior depth is new compared to prior exam. No suspicious calcifications or other findings of the right breast. Targeted ultrasound right breast at the 9 o'clock position 10 cm from the nipple demonstrates a 0.8 x 0.5 x 0.8 cm oval anechoic mass with possible fluid-debris level versus hypoechoic solid component. There is questionable internal blood flow within the hypoechoic component versus peripheral blood vessel. Targeted ultrasound of the right axilla demonstrates lymph nodes with normal morphology. IMPRESSION: 1. Indeterminate mass at the right breast 9 o'clock position may reflect a complicated cyst with fluid debris level versus complex solid and cystic mass. Recommend ultrasound-guided biopsy for definitive characterization. 2. No right axillary lymphadenopathy. 3. Interval resolution of the mass at the left breast 3 o'clock position. No mammographic findings of malignancy the left breast. RECOMMENDATION: Right breast ultrasound-guided biopsy. Patient will be contacted to schedule the procedure at her earliest convenience. I have discussed the findings and recommendations with the patient. The biopsy procedure was discussed with the patient and questions were answered. Patient expressed their understanding of the biopsy recommendation. BI-RADS CATEGORY  4: Suspicious. Electronically Signed   By: Mercie Stalker M.D.   On: 02/29/2024 09:40   US  LIMITED ULTRASOUND INCLUDING AXILLA RIGHT BREAST Result Date: 02/29/2024 CLINICAL DATA:  Patient  presents for follow-up of a probably benign mass in the outer left breast, initially imaged December 2023. Patient is due for annual exam. No breast complaints today. EXAM: DIGITAL DIAGNOSTIC BILATERAL MAMMOGRAM WITH TOMOSYNTHESIS AND CAD; ULTRASOUND RIGHT BREAST LIMITED; ULTRASOUND LEFT BREAST LIMITED TECHNIQUE: Bilateral digital diagnostic mammography and breast tomosynthesis was performed. The images were evaluated with computer-aided detection. ; Targeted ultrasound examination of the right breast was performed; Targeted ultrasound examination of the left breast was performed. COMPARISON:  Previous exam(s). ACR Breast Density Category b: There are scattered areas of fibroglandular density. FINDINGS: LEFT breast: The previously described 0.9 cm mass in the outer left breast at middle depth is not present on today's imaging. No new suspicious mass, calcifications, or other findings in the left breast. Targeted ultrasound the left breast at the 3  o'clock position 4 cm from the nipple was performed. The previously documented 0.3 cm mass at this location is not present. RIGHT breast: A 0.6 cm oval circumscribed mass in the outer central right breast at posterior depth is new compared to prior exam. No suspicious calcifications or other findings of the right breast. Targeted ultrasound right breast at the 9 o'clock position 10 cm from the nipple demonstrates a 0.8 x 0.5 x 0.8 cm oval anechoic mass with possible fluid-debris level versus hypoechoic solid component. There is questionable internal blood flow within the hypoechoic component versus peripheral blood vessel. Targeted ultrasound of the right axilla demonstrates lymph nodes with normal morphology. IMPRESSION: 1. Indeterminate mass at the right breast 9 o'clock position may reflect a complicated cyst with fluid debris level versus complex solid and cystic mass. Recommend ultrasound-guided biopsy for definitive characterization. 2. No right axillary  lymphadenopathy. 3. Interval resolution of the mass at the left breast 3 o'clock position. No mammographic findings of malignancy the left breast. RECOMMENDATION: Right breast ultrasound-guided biopsy. Patient will be contacted to schedule the procedure at her earliest convenience. I have discussed the findings and recommendations with the patient. The biopsy procedure was discussed with the patient and questions were answered. Patient expressed their understanding of the biopsy recommendation. BI-RADS CATEGORY  4: Suspicious. Electronically Signed   By: Mercie Stalker M.D.   On: 02/29/2024 09:40    Assessment/Plan  Malignant neoplasm of right breast Stage 0 breast cancer in the right breast, located in the milk duct. Scheduled for lumpectomy and potential radiation therapy. No family history of breast cancer. Experiencing significant stress and anxiety related to the diagnosis. Cancer is highly treatable, and early detection is favorable for prognosis. - Proceed with lumpectomy and coordinate with oncology for potential radiation therapy. - Encourage healthy lifestyle changes, including diet and exercise, to support overall health during treatment.  Essential (primary) hypertension Consistently elevated blood pressure with recent readings of 156/98 and 178, likely exacerbated by anxiety related to cancer diagnosis. Current medications include amlodipine  10 mg and triamterene /hydrochlorothiazide . Stress and anxiety are contributing to elevated readings. - Monitor blood pressure at home and record readings. - Reassess blood pressure in two weeks during follow-up visit. - Address anxiety to help manage blood pressure.  Anxiety disorder, unspecified Significant anxiety following breast cancer diagnosis. Symptoms include insomnia, feeling hot, and fear of elevators and airplanes. No prior counseling for anxiety. Discussed medication options, including antidepressants and anxiolytics. Prefers minimal  medication with low risk of dependency. Concerns about dependency on medications like Xanax  were addressed, and informed consent was obtained for alprazolam  use. - Prescribe alprazolam  0.25 mg as needed, primarily at night, to manage anxiety. - Encourage seeking counseling for additional support. - Recommend journaling and painting as therapeutic activities to manage anxiety. - Reassess anxiety and medication efficacy in two weeks.   Family/ staff Communication: Reviewed plan of care with patient verbalized understanding   Labs/tests ordered: None   Next Appointment: Return in about 2 weeks (around 04/04/2024) for routine visit for medical mangement of chronic issues also  recheck blooodpressure and anxiety .   Total time: 35 minutes. Greater than 50% of total time spent doing patient education regarding Generalized anxiety disorder,HTN,,right breast maglinant neoplasm health maintenance including symptom/medication management.   Estil Heman, NP

## 2024-03-22 ENCOUNTER — Other Ambulatory Visit: Payer: Self-pay | Admitting: General Surgery

## 2024-03-22 DIAGNOSIS — Z170 Estrogen receptor positive status [ER+]: Secondary | ICD-10-CM

## 2024-03-24 NOTE — Progress Notes (Signed)
 New Breast Cancer Diagnosis: Right Breast  Patient presented for annual mammogram which showed a 8 mm mass at the 9:00 position of the right breast.  .  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 Cherlynn Cornfield -Breast Lumpectomy with radioactive seed localization 04/04/2024   Medical oncologist, treatment if any:   Dr. Arno Bibles 04/18/2024   Family History of Breast/Ovarian/Prostate Cancer: none  Lymphedema issues, if any: No      Pain issues, if any: None    SAFETY ISSUES: Prior radiation? No Pacemaker/ICD? No Possible current pregnancy? Postmenopausal Is the patient on methotrexate? No  Current Complaints / other details:

## 2024-03-27 DIAGNOSIS — D0511 Intraductal carcinoma in situ of right breast: Secondary | ICD-10-CM | POA: Insufficient documentation

## 2024-03-27 NOTE — Progress Notes (Signed)
 Radiation Oncology         [phone] ________________________________  Initial Outpatient Consultation - Conducted via telephone at patient request.  I spoke with the patient to conduct this consult visit via telephone. The patient was notified in advance and was offered an in person or telemedicine meeting to allow for face to face communication but instead preferred to proceed with a telephone consult.    Name: Michelle Campos        MRN: [id number]  Date of Service: 03/28/2024 DOB: 12-Nov-1956  MW:[id number], Elijio Guadeloupe, NP  Lockie Rima, MD     REFERRING PHYSICIAN: Lockie Rima, MD   DIAGNOSIS: The encounter diagnosis was Ductal carcinoma in situ (DCIS) of right breast.   HISTORY OF PRESENT ILLNESS: Michelle Campos is a 68 y.o. female seen at the request of Dr. Cherlynn Cornfield for a new diagnosis of right breast cancer.  The patient was being followed with diagnostic imaging after screening mammogram in 2023 indicated a possible mass in the left breast..  This was followed with ultrasound and in June 2024 the area had decreased in size and felt overall to represent benign features.  She returned for diagnostic mammography on 02/29/2024 which showed resolution of the previously seen mass in the 3 o'clock position of the left breast.  However in the right breast, a new finding of a 6 mm mass was seen without calcifications.  Targeted ultrasound that day located a mass in the 9 o'clock position measuring 8 mm in greatest dimension with possible fluid debris level versus hypoechoic solid component.  The axilla of the right side was negative for any adenopathy, and a biopsy on 03/03/2024 of the right breast showed low-grade DCIS involving the papilloma, her DCIS was ER/PR positive.  She has met with Dr. Cherlynn Cornfield and is planning to undergo lumpectomy next Tuesday, 04/04/2024.  She is also scheduled to meet with Dr. Arno Bibles postoperatively on 04/18/2024, and she is contacted by phone today to discuss adjuvant  radiotherapy.    PREVIOUS RADIATION THERAPY: {EXAM; YES/NO:19492::"No"}   PAST MEDICAL HISTORY:  Past Medical History:  Diagnosis Date   Arthritis    "right knee" (05/27/2015)   Breast cancer (HCC) 2025   Family history of adverse reaction to anesthesia    "it's hard to bring my father back; more than once too" (05/27/2015)   GERD (gastroesophageal reflux disease)    Hypercalcemia    parathyroid  gland removed   Hypertension    controlled with medication   Obesity (BMI 30-39.9)    Parathyroid  cyst (HCC)    Superficial fungus infection of skin    on chest   Thyroid  cancer (HCC) 2016   Thyroid  mass    "2"       PAST SURGICAL HISTORY: Past Surgical History:  Procedure Laterality Date   BREAST BIOPSY Right 03/03/2024   US  RT BREAST BX W LOC DEV 1ST LESION IMG BX SPEC US  GUIDE 03/03/2024 [facility] MAMMOGRAPHY   CESAREAN SECTION  12/01/1995   IRRIGATION AND DEBRIDEMENT SEBACEOUS CYST  ~ 2010   on back   PARATHYROIDECTOMY Right 05/27/2015   PARATHYROIDECTOMY Right 05/27/2015   Procedure: RIGHT PARATHYROIDECTOMY WITH FROZEN SECTION;  Surgeon: Janita Mellow, MD;  Location: [facility] OR;  Service: ENT;  Laterality: Right;   THYROID  LOBECTOMY Right 05/27/2015   THYROIDECTOMY Right 05/27/2015   Procedure: RIGHT THYROID  LOBECTOMY WITH FROZEN SECTION;  Surgeon: Janita Mellow, MD;  Location: [facility] OR;  Service: ENT;  Laterality: Right;   TUBAL LIGATION  12/01/1995  FAMILY HISTORY:  Family History  Problem Relation Age of Onset   Congestive Heart Failure Mother    Diabetes Mother 85   Hypertension Mother    Congestive Heart Failure Father    Multiple myeloma Brother 67   Diabetes Sister 30   Hypertension Sister    Hypertension Brother    Hypertension Brother    Hyperlipidemia Brother    Hypertension Brother    Hyperlipidemia Brother      SOCIAL HISTORY:  reports that she has quit smoking. Her smoking use included cigarettes. She has a 1 pack-year smoking history. She has never used  smokeless tobacco. She reports current alcohol use. She reports that she does not use drugs.  The patient is divorced and lives in [place].  She***   ALLERGIES: Skin adhesives [cyanoacrylate]   MEDICATIONS:  Current Outpatient Medications  Medication Sig Dispense Refill   ALPRAZolam  (XANAX ) 0.25 MG tablet Take 1 tablet (0.25 mg total) by mouth 2 (two) times daily as needed for anxiety. 30 tablet 0   amLODipine  (NORVASC ) 10 MG tablet TAKE 1 TABLET DAILY 90 tablet 3   cetirizine  (ZYRTEC ) 10 MG tablet Take 1 tablet (10 mg total) by mouth at bedtime. 30 tablet 11   Multiple Vitamins-Minerals (MULTIVITAMIN ADULTS 50+ PO) Take by mouth daily.     St Johns Wort 300 MG CAPS Take 1 capsule by mouth daily.     triamterene -hydrochlorothiazide  (MAXZIDE -25) 37.5-25 MG tablet TAKE 1 TABLET DAILY 90 tablet 0   No current facility-administered medications for this visit.     REVIEW OF SYSTEMS: On review of systems, the patient reports that she is doing ***     PHYSICAL EXAM:  Wt Readings from Last 3 Encounters:  03/21/24 236 lb (107 kg)  09/17/23 245 lb (111.1 kg)  08/30/23 247 lb (112 kg)   Unable to assess given encounter type.   ECOG = ***  0 - Asymptomatic (Fully active, able to carry on all predisease activities without restriction)  1 - Symptomatic but completely ambulatory (Restricted in physically strenuous activity but ambulatory and able to carry out work of a light or sedentary nature. For example, light housework, office work)  2 - Symptomatic, <50% in bed during the day (Ambulatory and capable of all self care but unable to carry out any work activities. Up and about more than 50% of waking hours)  3 - Symptomatic, >50% in bed, but not bedbound (Capable of only limited self-care, confined to bed or chair 50% or more of waking hours)  4 - Bedbound (Completely disabled. Cannot carry on any self-care. Totally confined to bed or chair)  5 - Death   Aurea Blossom MM, Creech RH,  Tormey DC, et al. [phone]). "Toxicity and response criteria of the [facility]". Am. Hillard Lowes. Oncol. 5 (6): 649-55    LABORATORY DATA:  Lab Results  Component Value Date   WBC 3.9 09/17/2023   HGB 13.7 09/17/2023   HCT 43.9 09/17/2023   MCV 85.7 09/17/2023   PLT 277 09/17/2023   Lab Results  Component Value Date   NA 142 09/17/2023   K 3.7 09/17/2023   CL 103 09/17/2023   CO2 31 09/17/2023   Lab Results  Component Value Date   ALT 15 09/17/2023   AST 17 09/17/2023   BILITOT 0.7 09/17/2023      RADIOGRAPHY: US  RT BREAST BX W LOC DEV 1ST LESION IMG BX SPEC US  GUIDE Addendum Date: 03/08/2024 ADDENDUM REPORT: 03/08/2024 12:07 ADDENDUM: Pathology  revealed DUCTAL CARCINOMA IN SITU, LOW-GRADE, INVOLVING A PAPILLOMA, NECROSIS: NOT IDENTIFIED, CALCIFICATIONS: NOT IDENTIFIED of the RIGHT breast, outer, 9 o'clock, 10cmfn, (ribbon clip). This was found to be concordant by Dr. Severiano Danish. Pathology results were discussed with the patient by telephone by Ladonna Pickup, RN Nurse Navigator. The patient reported doing well after the biopsy with tenderness at the site. Post biopsy instructions and care were reviewed and questions were answered. The patient was encouraged to call [facility] for any additional concerns. Surgical consultation has been arranged with Dr. Lockie Rima at [facility] on March 20, 2024. Pathology results reported by Kraig Peru, RN on 03/08/2024. Electronically Signed   By: Sundra Engel M.D.   On: 03/08/2024 12:07   Result Date: 03/08/2024 CLINICAL DATA:  68 year old female presents for tissue sampling of 0.8 cm OUTER RIGHT breast mass. EXAM: ULTRASOUND GUIDED RIGHT BREAST CORE NEEDLE BIOPSY COMPARISON:  Previous exam(s). PROCEDURE: I met with the patient and we discussed the procedure of ultrasound-guided biopsy, including benefits and alternatives. We discussed the high likelihood of a successful procedure. We discussed the risks of the  procedure, including infection, bleeding, tissue injury, clip migration, and inadequate sampling. Informed written consent was given. The usual time-out protocol was performed immediately prior to the procedure. Using sterile technique and 1% Lidocaine  as local anesthetic, under direct ultrasound visualization, a 12 gauge spring-loaded device was used to perform biopsy of the 0.8 cm mass at the 9 o'clock position of the RIGHT breast 10 cm from the nipple using a LATERAL approach. At the conclusion of the procedure a RIBBON shaped tissue marker clip was deployed into the biopsy cavity. Follow up 2 view mammogram was performed and dictated separately. IMPRESSION: Ultrasound guided biopsy of 0.8 cm OUTER RIGHT breast mass. No apparent complications. Electronically Signed: By: Sundra Engel M.D. On: 03/03/2024 09:04   MM CLIP PLACEMENT RIGHT Result Date: 03/03/2024 CLINICAL DATA:  Evaluate biopsy clip placement following ultrasound-guided RIGHT breast biopsy. EXAM: 3D DIAGNOSTIC RIGHT MAMMOGRAM POST ULTRASOUND BIOPSY COMPARISON:  Previous exam(s). ACR Breast Density Category b: There are scattered areas of fibroglandular density. FINDINGS: 3D Mammographic images were obtained following ultrasound guided biopsy of the 0.8 cm 9 o'clock position RIGHT breast mass. The biopsy marking clip is in expected position at the site of biopsy. IMPRESSION: Appropriate positioning of the RIBBON shaped biopsy marking clip at the site of biopsy in the OUTER RIGHT breast. Final Assessment: Post Procedure Mammograms for Marker Placement Electronically Signed   By: Sundra Engel M.D.   On: 03/03/2024 09:19   MM 3D DIAGNOSTIC MAMMOGRAM BILATERAL BREAST Result Date: 02/29/2024 CLINICAL DATA:  Patient presents for follow-up of a probably benign mass in the outer left breast, initially imaged December 2023. Patient is due for annual exam. No breast complaints today. EXAM: DIGITAL DIAGNOSTIC BILATERAL MAMMOGRAM WITH TOMOSYNTHESIS AND CAD;  ULTRASOUND RIGHT BREAST LIMITED; ULTRASOUND LEFT BREAST LIMITED TECHNIQUE: Bilateral digital diagnostic mammography and breast tomosynthesis was performed. The images were evaluated with computer-aided detection. ; Targeted ultrasound examination of the right breast was performed; Targeted ultrasound examination of the left breast was performed. COMPARISON:  Previous exam(s). ACR Breast Density Category b: There are scattered areas of fibroglandular density. FINDINGS: LEFT breast: The previously described 0.9 cm mass in the outer left breast at middle depth is not present on today's imaging. No new suspicious mass, calcifications, or other findings in the left breast. Targeted ultrasound the left breast at the 3 o'clock position 4 cm from the  nipple was performed. The previously documented 0.3 cm mass at this location is not present. RIGHT breast: A 0.6 cm oval circumscribed mass in the outer central right breast at posterior depth is new compared to prior exam. No suspicious calcifications or other findings of the right breast. Targeted ultrasound right breast at the 9 o'clock position 10 cm from the nipple demonstrates a 0.8 x 0.5 x 0.8 cm oval anechoic mass with possible fluid-debris level versus hypoechoic solid component. There is questionable internal blood flow within the hypoechoic component versus peripheral blood vessel. Targeted ultrasound of the right axilla demonstrates lymph nodes with normal morphology. IMPRESSION: 1. Indeterminate mass at the right breast 9 o'clock position may reflect a complicated cyst with fluid debris level versus complex solid and cystic mass. Recommend ultrasound-guided biopsy for definitive characterization. 2. No right axillary lymphadenopathy. 3. Interval resolution of the mass at the left breast 3 o'clock position. No mammographic findings of malignancy the left breast. RECOMMENDATION: Right breast ultrasound-guided biopsy. Patient will be contacted to schedule the  procedure at her earliest convenience. I have discussed the findings and recommendations with the patient. The biopsy procedure was discussed with the patient and questions were answered. Patient expressed their understanding of the biopsy recommendation. BI-RADS CATEGORY  4: Suspicious. Electronically Signed   By: Mercie Stalker M.D.   On: 02/29/2024 09:40   US  LIMITED ULTRASOUND INCLUDING AXILLA LEFT BREAST  Result Date: 02/29/2024 CLINICAL DATA:  Patient presents for follow-up of a probably benign mass in the outer left breast, initially imaged December 2023. Patient is due for annual exam. No breast complaints today. EXAM: DIGITAL DIAGNOSTIC BILATERAL MAMMOGRAM WITH TOMOSYNTHESIS AND CAD; ULTRASOUND RIGHT BREAST LIMITED; ULTRASOUND LEFT BREAST LIMITED TECHNIQUE: Bilateral digital diagnostic mammography and breast tomosynthesis was performed. The images were evaluated with computer-aided detection. ; Targeted ultrasound examination of the right breast was performed; Targeted ultrasound examination of the left breast was performed. COMPARISON:  Previous exam(s). ACR Breast Density Category b: There are scattered areas of fibroglandular density. FINDINGS: LEFT breast: The previously described 0.9 cm mass in the outer left breast at middle depth is not present on today's imaging. No new suspicious mass, calcifications, or other findings in the left breast. Targeted ultrasound the left breast at the 3 o'clock position 4 cm from the nipple was performed. The previously documented 0.3 cm mass at this location is not present. RIGHT breast: A 0.6 cm oval circumscribed mass in the outer central right breast at posterior depth is new compared to prior exam. No suspicious calcifications or other findings of the right breast. Targeted ultrasound right breast at the 9 o'clock position 10 cm from the nipple demonstrates a 0.8 x 0.5 x 0.8 cm oval anechoic mass with possible fluid-debris level versus hypoechoic solid component.  There is questionable internal blood flow within the hypoechoic component versus peripheral blood vessel. Targeted ultrasound of the right axilla demonstrates lymph nodes with normal morphology. IMPRESSION: 1. Indeterminate mass at the right breast 9 o'clock position may reflect a complicated cyst with fluid debris level versus complex solid and cystic mass. Recommend ultrasound-guided biopsy for definitive characterization. 2. No right axillary lymphadenopathy. 3. Interval resolution of the mass at the left breast 3 o'clock position. No mammographic findings of malignancy the left breast. RECOMMENDATION: Right breast ultrasound-guided biopsy. Patient will be contacted to schedule the procedure at her earliest convenience. I have discussed the findings and recommendations with the patient. The biopsy procedure was discussed with the patient and questions were answered. Patient  expressed their understanding of the biopsy recommendation. BI-RADS CATEGORY  4: Suspicious. Electronically Signed   By: Mercie Stalker M.D.   On: 02/29/2024 09:40   US  LIMITED ULTRASOUND INCLUDING AXILLA RIGHT BREAST Result Date: 02/29/2024 CLINICAL DATA:  Patient presents for follow-up of a probably benign mass in the outer left breast, initially imaged December 2023. Patient is due for annual exam. No breast complaints today. EXAM: DIGITAL DIAGNOSTIC BILATERAL MAMMOGRAM WITH TOMOSYNTHESIS AND CAD; ULTRASOUND RIGHT BREAST LIMITED; ULTRASOUND LEFT BREAST LIMITED TECHNIQUE: Bilateral digital diagnostic mammography and breast tomosynthesis was performed. The images were evaluated with computer-aided detection. ; Targeted ultrasound examination of the right breast was performed; Targeted ultrasound examination of the left breast was performed. COMPARISON:  Previous exam(s). ACR Breast Density Category b: There are scattered areas of fibroglandular density. FINDINGS: LEFT breast: The previously described 0.9 cm mass in the outer left breast at  middle depth is not present on today's imaging. No new suspicious mass, calcifications, or other findings in the left breast. Targeted ultrasound the left breast at the 3 o'clock position 4 cm from the nipple was performed. The previously documented 0.3 cm mass at this location is not present. RIGHT breast: A 0.6 cm oval circumscribed mass in the outer central right breast at posterior depth is new compared to prior exam. No suspicious calcifications or other findings of the right breast. Targeted ultrasound right breast at the 9 o'clock position 10 cm from the nipple demonstrates a 0.8 x 0.5 x 0.8 cm oval anechoic mass with possible fluid-debris level versus hypoechoic solid component. There is questionable internal blood flow within the hypoechoic component versus peripheral blood vessel. Targeted ultrasound of the right axilla demonstrates lymph nodes with normal morphology. IMPRESSION: 1. Indeterminate mass at the right breast 9 o'clock position may reflect a complicated cyst with fluid debris level versus complex solid and cystic mass. Recommend ultrasound-guided biopsy for definitive characterization. 2. No right axillary lymphadenopathy. 3. Interval resolution of the mass at the left breast 3 o'clock position. No mammographic findings of malignancy the left breast. RECOMMENDATION: Right breast ultrasound-guided biopsy. Patient will be contacted to schedule the procedure at her earliest convenience. I have discussed the findings and recommendations with the patient. The biopsy procedure was discussed with the patient and questions were answered. Patient expressed their understanding of the biopsy recommendation. BI-RADS CATEGORY  4: Suspicious. Electronically Signed   By: Mercie Stalker M.D.   On: 02/29/2024 09:40       IMPRESSION/PLAN: 1. Low-grade ER/PR positive DCIS involving a papilloma of the right breast. Dr. Jeryl Moris discusses the pathology findings and reviews the nature of noninvasive right breast  disease.  The patient has met with Dr. Cherlynn Cornfield and has plans to undergo breast conservation with lumpectomy next week.  Dr. Jeryl Moris discusses the rationale for external radiotherapy to the breast  to reduce risks of local recurrence.  We anticipate that Dr. Arno Bibles will recommend adjuvant antiestrogen therapy to follow. We discussed the risks, benefits, short, and long term effects of radiotherapy, as well as the curative intent, and the patient is interested in proceeding. Dr. Jeryl Moris discusses the delivery and logistics of radiotherapy and anticipates a course of 4 weeks of radiotherapy to the right breast. We will see her back a few weeks after surgery to discuss the simulation process and anticipate we starting radiotherapy about 4-6 weeks after surgery.       This encounter was conducted via telephone.  The patient has provided two factor identification and has given  verbal consent for this type of encounter and has been advised to only accept a meeting of this type in a secure network environment. The time spent during this encounter was *** minutes including preparation, discussion, and coordination of the patient's care. The attendants for this meeting include Kee Pastel, RN, Dr. Jeryl Moris, Bettejane Brownie  and [facility].  During the encounter,  Kee Pastel, RN, Dr. Jeryl Moris, and Bettejane Brownie were located at [facility] Radiation Oncology Department.  Zelphia Johnson-Joy was located at home.  The above documentation reflects my direct findings during this shared patient visit. Please see the separate note by Dr. Jeryl Moris on this date for the remainder of the patient's plan of care.    Shelvia Dick, [facility]    **Disclaimer: This note was dictated with voice recognition software. Similar sounding words can inadvertently be transcribed and this note may contain transcription errors which may not have been corrected upon publication of note.**

## 2024-03-28 ENCOUNTER — Ambulatory Visit
Admission: RE | Admit: 2024-03-28 | Discharge: 2024-03-28 | Discharge status: Home / Self Care | Payer: Medicare (Managed Care) | Source: Ambulatory Visit | Attending: Radiation Oncology | Admitting: Radiation Oncology

## 2024-03-28 ENCOUNTER — Encounter: Payer: Self-pay | Admitting: Radiation Oncology

## 2024-03-28 ENCOUNTER — Other Ambulatory Visit: Payer: Self-pay

## 2024-03-28 ENCOUNTER — Ambulatory Visit
Admission: RE | Admit: 2024-03-28 | Discharge: 2024-03-28 | Disposition: A | Discharge status: Home / Self Care | Payer: Medicare (Managed Care) | Source: Ambulatory Visit | Attending: Radiation Oncology | Admitting: Radiation Oncology

## 2024-03-28 VITALS — Ht 68.0 in | Wt 231.0 lb

## 2024-03-28 DIAGNOSIS — E669 Obesity, unspecified: Secondary | ICD-10-CM | POA: Diagnosis not present

## 2024-03-28 DIAGNOSIS — Z170 Estrogen receptor positive status [ER+]: Secondary | ICD-10-CM | POA: Insufficient documentation

## 2024-03-28 DIAGNOSIS — K219 Gastro-esophageal reflux disease without esophagitis: Secondary | ICD-10-CM | POA: Insufficient documentation

## 2024-03-28 DIAGNOSIS — Z79899 Other long term (current) drug therapy: Secondary | ICD-10-CM | POA: Insufficient documentation

## 2024-03-28 DIAGNOSIS — D0511 Intraductal carcinoma in situ of right breast: Secondary | ICD-10-CM | POA: Diagnosis present

## 2024-03-28 DIAGNOSIS — Z87891 Personal history of nicotine dependence: Secondary | ICD-10-CM | POA: Insufficient documentation

## 2024-03-28 DIAGNOSIS — M129 Arthropathy, unspecified: Secondary | ICD-10-CM | POA: Insufficient documentation

## 2024-03-28 DIAGNOSIS — I10 Essential (primary) hypertension: Secondary | ICD-10-CM | POA: Diagnosis not present

## 2024-03-29 ENCOUNTER — Other Ambulatory Visit: Payer: Self-pay

## 2024-03-29 ENCOUNTER — Encounter (HOSPITAL_BASED_OUTPATIENT_CLINIC_OR_DEPARTMENT_OTHER): Payer: Self-pay | Admitting: General Surgery

## 2024-03-30 ENCOUNTER — Encounter (HOSPITAL_BASED_OUTPATIENT_CLINIC_OR_DEPARTMENT_OTHER)
Admission: RE | Admit: 2024-03-30 | Discharge: 2024-03-30 | Disposition: A | Discharge status: Home / Self Care | Payer: Medicare (Managed Care) | Source: Ambulatory Visit | Attending: General Surgery | Admitting: General Surgery

## 2024-03-30 ENCOUNTER — Ambulatory Visit
Admission: RE | Admit: 2024-03-30 | Discharge: 2024-03-30 | Disposition: A | Discharge status: Home / Self Care | Payer: Medicare (Managed Care) | Source: Ambulatory Visit | Attending: General Surgery | Admitting: General Surgery

## 2024-03-30 DIAGNOSIS — D0511 Intraductal carcinoma in situ of right breast: Secondary | ICD-10-CM | POA: Diagnosis not present

## 2024-03-30 DIAGNOSIS — Z01818 Encounter for other preprocedural examination: Secondary | ICD-10-CM | POA: Insufficient documentation

## 2024-03-30 DIAGNOSIS — I10 Essential (primary) hypertension: Secondary | ICD-10-CM | POA: Diagnosis not present

## 2024-03-30 DIAGNOSIS — C50411 Malignant neoplasm of upper-outer quadrant of right female breast: Secondary | ICD-10-CM

## 2024-03-30 HISTORY — PX: BREAST BIOPSY: SHX20

## 2024-03-30 LAB — BASIC METABOLIC PANEL WITH GFR
Anion gap: 11 (ref 5–15)
BUN: 12 mg/dL (ref 8–23)
CO2: 28 mmol/L (ref 22–32)
Calcium: 9.9 mg/dL (ref 8.9–10.3)
Chloride: 103 mmol/L (ref 98–111)
Creatinine, Ser: 0.77 mg/dL (ref 0.44–1.00)
GFR, Estimated: >60 mL/min (ref >60)
Glucose, Bld: 96 mg/dL (ref 70–99)
Potassium: 3.8 mmol/L (ref 3.5–5.1)
Sodium: 142 mmol/L (ref 135–145)

## 2024-03-30 MED ORDER — CHLORHEXIDINE GLUCONATE CLOTH 2 % EX PADS
6.0000 | MEDICATED_PAD | Freq: Once | CUTANEOUS | Status: DC
Start: 2024-03-30 — End: 2024-04-04

## 2024-03-30 MED ORDER — CHLORHEXIDINE GLUCONATE CLOTH 2 % EX PADS: 6.0000 | MEDICATED_PAD | Freq: Once | CUTANEOUS | Status: DC

## 2024-03-30 NOTE — Progress Notes (Signed)

## 2024-04-03 NOTE — Anesthesia Preprocedure Evaluation (Signed)
 Anesthesia Evaluation  Patient identified by MRN, date of birth, ID band Patient awake    Reviewed: Allergy & Precautions, NPO status , Patient's Chart, lab work & pertinent test results  Airway Mallampati: II  TM Distance: >3 FB Neck ROM: Full    Dental  (+) Teeth Intact, Dental Advisory Given   Pulmonary former smoker   Pulmonary exam normal breath sounds clear to auscultation       Cardiovascular hypertension (161/98 preop, per pt very variable), Pt. on medications Normal cardiovascular exam Rhythm:Regular Rate:Normal     Neuro/Psych negative neurological ROS  negative psych ROS   GI/Hepatic Neg liver ROS,GERD  Controlled,,  Endo/Other  Obesity BMI 36  Renal/GU negative Renal ROS  negative genitourinary   Musculoskeletal  (+) Arthritis , Osteoarthritis,    Abdominal  (+) + obese  Peds  Hematology negative hematology ROS (+)   Anesthesia Other Findings   Reproductive/Obstetrics negative OB ROS                             Anesthesia Physical Anesthesia Plan  ASA: 2  Anesthesia Plan: General   Post-op Pain Management: Tylenol  PO (pre-op)*   Induction: Intravenous  PONV Risk Score and Plan: 3 and Ondansetron , Dexamethasone , Midazolam  and Treatment may vary due to age or medical condition  Airway Management Planned: LMA  Additional Equipment: None  Intra-op Plan:   Post-operative Plan: Extubation in OR  Informed Consent: I have reviewed the patients History and Physical, chart, labs and discussed the procedure including the risks, benefits and alternatives for the proposed anesthesia with the patient or authorized representative who has indicated his/her understanding and acceptance.     Dental advisory given  Plan Discussed with: CRNA  Anesthesia Plan Comments:        Anesthesia Quick Evaluation

## 2024-04-03 NOTE — H&P (Signed)
 REFERRING PHYSICIAN:  Hu   PROVIDER:  Eppie Hasting, MD   Care Team: Patient Care Team: Audrene Lease, NP as PCP - General Cherlynn Cornfield Chaya Cord, MD as Consulting Provider (Surgical Oncology)    MRN: [id number] DOB: 11/06/1956 DATE OF ENCOUNTER: 03/20/2024   Subjective    Chief Complaint: NEW BREAST CANCER       History of Present Illness: Michelle Campos is a 68 y.o. female who is seen today as an office consultation at the request of Dr. Roselie Conger for evaluation of NEW BREAST CANCER .     Patient presents with a new diagnosis of right breast cancer April 2025.  The patient was coming in for a probably benign follow-up on the left as well as annual exam.  The area in the left was not present any longer.  On the right there was found to be a 8 mm mass at 9:00 10 cm from the nipple.  The axilla appeared normal.  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  This was ER and PR strongly positive.   The patient does not have any personal history of cancer.  She does have a brother who passed away from multiple myeloma.  She thinks her maternal grandmother may have had cancer, but she is not sure.   Work -patient works from home using the computer.  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       Diagnostic mammogram/u/s 02/29/24 [facility] ACR Breast Density Category b: There are scattered areas of fibroglandular density.  FINDINGS: LEFT breast: The previously described 0.9 cm mass in the outer left breast at middle depth is not present on today's imaging. No new suspicious mass, calcifications, or other findings in the left breast.  Targeted ultrasound the left breast at the 3 o'clock position 4 cm from the nipple was performed. The previously documented 0.3 cm mass at this location is not present.  RIGHT breast: A 0.6 cm oval circumscribed mass in the outer central right breast at posterior depth is  new compared to prior exam. No suspicious calcifications or other findings of the right breast.  Targeted ultrasound right breast at the 9 o'clock position 10 cm from the nipple demonstrates a 0.8 x 0.5 x 0.8 cm oval anechoic mass with possible fluid-debris level versus hypoechoic solid component. There is questionable internal blood flow within the hypoechoic component versus peripheral blood vessel. Targeted ultrasound of the right axilla demonstrates lymph nodes with normal morphology.  IMPRESSION: 1. Indeterminate mass at the right breast 9 o'clock position may reflect a complicated cyst with fluid debris level versus complex solid and cystic mass. Recommend ultrasound-guided biopsy for definitive characterization. 2. No right axillary lymphadenopathy. 3. Interval resolution of the mass at the left breast 3 o'clock position. No mammographic findings of malignancy the left breast.  RECOMMENDATION: Right breast ultrasound-guided biopsy. Patient will be contacted to schedule the procedure at her earliest convenience.  I have discussed the findings and recommendations with the patient. The biopsy procedure was discussed with the patient and questions were answered. Patient expressed their understanding of the biopsy recommendation.  BI-RADS CATEGORY  4: Suspicious.     Pathology core needle biopsy: 03/03/24 1. Breast, right, needle core biopsy, outer, 9 o'clock, 10cmfn :       -  DUCTAL CARCINOMA IN SITU, LOW-GRADE, INVOLVING A PAPILLOMA       NECROSIS: NOT IDENTIFIED       CALCIFICATIONS:  NOT IDENTIFIED       DCIS LENGTH: 0.6 CM    Receptors: Estrogen Receptor:  99%, POSITIVE, STRONG STAINING INTENSITY  Progesterone Receptor:  80%, POSITIVE, STRONG STAINING INTENSITY      Review of Systems: A complete review of systems was obtained from the patient.  I have reviewed this information and discussed as appropriate with the patient.  See HPI as well for other ROS.          Medical History: Past Medical History      Past Medical History:  Diagnosis Date   Arthritis     GERD (gastroesophageal reflux disease)     History of cancer     Hypertension          Problem List     Patient Active Problem List  Diagnosis   Malignant neoplasm of upper-outer quadrant of right breast in female, estrogen receptor positive (CMS/HHS-HCC)   Family history of cancer        Past Surgical History       Past Surgical History:  Procedure Laterality Date   CYSTECTOMY       Lipoma tumor removal       Thyroid  surgery            Allergies  No Known Allergies     Medications Ordered Prior to Encounter        Current Outpatient Medications on File Prior to Visit  Medication Sig Dispense Refill   amLODIPine  (NORVASC ) 10 MG tablet Take 1 tablet by mouth once daily       multivitamin tablet Take 1 tablet by mouth once daily       St. John's wort 300 mg Cap Take 1 capsule by mouth once daily       triamterene -hydroCHLOROthiazide  (MAXZIDE -25) 37.5-25 mg tablet          No current facility-administered medications on file prior to visit.        Family History       Family History  Problem Relation Age of Onset   Obesity Mother     High blood pressure (Hypertension) Mother     Diabetes Mother     Obesity Father     High blood pressure (Hypertension) Father     Obesity Sister     High blood pressure (Hypertension) Sister     Obesity Brother     High blood pressure (Hypertension) Brother          Tobacco Use History  Social History        Tobacco Use  Smoking Status Former   Types: Cigarettes   Start date: 1978  Smokeless Tobacco Never        Social History  Social History         Socioeconomic History   Marital status: Divorced  Tobacco Use   Smoking status: Former      Types: Cigarettes      Start date: 1978   Smokeless tobacco: Never  Substance and Sexual Activity   Alcohol use: Yes   Drug use: Never    Social Drivers of Health         Housing Stability: Unknown (03/20/2024)    Housing Stability Vital Sign     Homeless in the Last Year: No        Objective:          Vitals:    03/20/24 1400 03/20/24 1401  BP: (!) 170/98    Pulse: 110    Temp:  37 C (98.6 F)    SpO2: 98%    Weight: (!) 106.8 kg (235 lb 6.4 oz)    Height: 172.7 cm (5\' 8" )    PainSc:   0-No pain  PainLoc:   Breast    Body mass index is 35.79 kg/m.   Gen:  No acute distress.  Well nourished and well groomed.   Neurological: Alert and oriented to person, place, and time. Coordination normal.  Head: Normocephalic and atraumatic.  Eyes: Conjunctivae are normal. Pupils are equal, round, and reactive to light. No scleral icterus.  Neck: Normal range of motion. Neck supple. No tracheal deviation or thyromegaly present.  Cardiovascular: Normal rate, regular rhythm, normal heart sounds and intact distal pulses.  Exam reveals no gallop and no friction rub.  No murmur heard. Breast: Breast are ptotic bilaterally.  No nipple retraction or nipple discharge is present.  There is a seborrheic keratosis on the upper outer right breast.  There is no evidence of palpable mass.  She is no lymphadenopathy present on either side.  No appreciable bruising from the biopsy. Respiratory: Effort normal.  No respiratory distress. No chest wall tenderness. Breath sounds normal.  No wheezes, rales or rhonchi.  GI: Soft. Bowel sounds are normal. The abdomen is soft and nontender.  There is no rebound and no guarding.  Musculoskeletal: Normal range of motion. Extremities are nontender.  Lymphadenopathy: No cervical, preauricular, postauricular or axillary adenopathy is present Skin: Skin is warm and dry. No rash noted. No diaphoresis. No erythema. No pallor. No clubbing, cyanosis, or edema.   Psychiatric: Normal mood and affect. Behavior is normal. Judgment and thought content normal.      Labs No recent labs, however c-Met and CBC from October 2024 were  essentially normal. Hemoglobin A1c was 5.8.   Assessment and Plan:        ICD-10-CM    1. Malignant neoplasm of upper-outer quadrant of right breast in female, estrogen receptor positive (CMS/HHS-HCC)  C50.411 Ambulatory Referral to Oncology-Medical    Z17.0 Ambulatory Referral to Radiation Oncology      Ambulatory Referral to Cancer Genetics     2. Family history of cancer  Z80.9         Patient has a new diagnosis of stage 0 hormone positive right breast cancer.  I recommend a seed targeted lumpectomy for the patient.  I will also refer the patient for medical and radiation oncology.  I am sending a genetics referral for the patient as well.  I am sending her also to [facility] for postop compression bra.   The surgical procedure was described to the patient.  I discussed the incision type and location and that we would need radiology involved pre op to place a seed 1-2 days pre op.      We discussed the risks bleeding, infection, damage to other structures, need for further procedures/surgeries.  We discussed the risk of seroma.  The patient was advised that we may need to go back to surgery. The patient was advised that these are the most common complications, but that others can occur as well. I discussed the risk of alteration in breast contour or size.  There are rare instances of heart/lung issues post op as well as blood clots.      They were advised against taking aspirin or other anti-inflammatory agents/blood thinners the week before surgery.     The risks and benefits of the procedure were described to the patient and she  wishes to proceed.

## 2024-04-04 ENCOUNTER — Ambulatory Visit (HOSPITAL_BASED_OUTPATIENT_CLINIC_OR_DEPARTMENT_OTHER)
Admission: RE | Admit: 2024-04-04 | Discharge: 2024-04-04 | Disposition: A | Discharge status: Home / Self Care | Payer: Medicare (Managed Care) | Attending: General Surgery | Admitting: General Surgery

## 2024-04-04 ENCOUNTER — Encounter (HOSPITAL_BASED_OUTPATIENT_CLINIC_OR_DEPARTMENT_OTHER)
Admission: RE | Disposition: A | Discharge status: Home / Self Care | Payer: Self-pay | Source: Home / Self Care | Attending: General Surgery

## 2024-04-04 ENCOUNTER — Ambulatory Visit
Admission: RE | Admit: 2024-04-04 | Discharge: 2024-04-04 | Disposition: A | Discharge status: Home / Self Care | Payer: Medicare (Managed Care) | Source: Ambulatory Visit | Attending: General Surgery | Admitting: General Surgery

## 2024-04-04 ENCOUNTER — Ambulatory Visit (HOSPITAL_BASED_OUTPATIENT_CLINIC_OR_DEPARTMENT_OTHER): Payer: Medicare (Managed Care) | Admitting: Anesthesiology

## 2024-04-04 ENCOUNTER — Other Ambulatory Visit: Payer: Self-pay

## 2024-04-04 ENCOUNTER — Encounter (HOSPITAL_BASED_OUTPATIENT_CLINIC_OR_DEPARTMENT_OTHER): Payer: Self-pay | Admitting: General Surgery

## 2024-04-04 ENCOUNTER — Ambulatory Visit (HOSPITAL_BASED_OUTPATIENT_CLINIC_OR_DEPARTMENT_OTHER): Payer: Self-pay | Admitting: Anesthesiology

## 2024-04-04 DIAGNOSIS — C50911 Malignant neoplasm of unspecified site of right female breast: Secondary | ICD-10-CM

## 2024-04-04 DIAGNOSIS — Z170 Estrogen receptor positive status [ER+]: Secondary | ICD-10-CM | POA: Diagnosis not present

## 2024-04-04 DIAGNOSIS — Z87891 Personal history of nicotine dependence: Secondary | ICD-10-CM | POA: Insufficient documentation

## 2024-04-04 DIAGNOSIS — C50411 Malignant neoplasm of upper-outer quadrant of right female breast: Secondary | ICD-10-CM

## 2024-04-04 DIAGNOSIS — I10 Essential (primary) hypertension: Secondary | ICD-10-CM | POA: Insufficient documentation

## 2024-04-04 DIAGNOSIS — Z1721 Progesterone receptor positive status: Secondary | ICD-10-CM | POA: Insufficient documentation

## 2024-04-04 DIAGNOSIS — Z807 Family history of other malignant neoplasms of lymphoid, hematopoietic and related tissues: Secondary | ICD-10-CM | POA: Diagnosis not present

## 2024-04-04 DIAGNOSIS — D0511 Intraductal carcinoma in situ of right breast: Secondary | ICD-10-CM | POA: Diagnosis not present

## 2024-04-04 HISTORY — PX: BREAST LUMPECTOMY WITH RADIOACTIVE SEED LOCALIZATION: SHX6424

## 2024-04-04 SURGERY — BREAST LUMPECTOMY WITH RADIOACTIVE SEED LOCALIZATION
Anesthesia: General | Site: Breast | Laterality: Right

## 2024-04-04 MED ORDER — ONDANSETRON HCL 4 MG/2ML IJ SOLN: 4.0000 mg | Freq: Once | INTRAMUSCULAR | Status: DC | PRN

## 2024-04-04 MED ORDER — MIDAZOLAM HCL 2 MG/2ML IJ SOLN
INTRAMUSCULAR | Status: AC
  Filled 2024-04-04: qty 2

## 2024-04-04 MED ORDER — KETOROLAC TROMETHAMINE 30 MG/ML IJ SOLN
INTRAMUSCULAR | Status: AC
  Filled 2024-04-04: qty 1

## 2024-04-04 MED ORDER — HYDROMORPHONE HCL 1 MG/ML IJ SOLN: 0.2500 mg | INTRAMUSCULAR | Status: DC | PRN

## 2024-04-04 MED ORDER — ONDANSETRON HCL 4 MG/2ML IJ SOLN
INTRAMUSCULAR | Status: DC | PRN
  Administered 2024-04-04: 4 mg via INTRAVENOUS

## 2024-04-04 MED ORDER — OXYCODONE HCL 5 MG/5ML PO SOLN: 5.0000 mg | Freq: Once | ORAL | Status: DC | PRN

## 2024-04-04 MED ORDER — ACETAMINOPHEN 500 MG PO TABS
1000.0000 mg | ORAL_TABLET | ORAL | Status: DC
Start: 2024-04-04 — End: 2024-04-04

## 2024-04-04 MED ORDER — ACETAMINOPHEN 500 MG PO TABS
1000.0000 mg | ORAL_TABLET | Freq: Once | ORAL | Status: AC
  Administered 2024-04-04: 1000 mg via ORAL

## 2024-04-04 MED ORDER — DEXAMETHASONE SODIUM PHOSPHATE 10 MG/ML IJ SOLN
INTRAMUSCULAR | Status: DC | PRN
  Administered 2024-04-04: 5 mg via INTRAVENOUS

## 2024-04-04 MED ORDER — CEFAZOLIN SODIUM-DEXTROSE 2-4 GM/100ML-% IV SOLN
INTRAVENOUS | Status: AC
  Filled 2024-04-04: qty 100

## 2024-04-04 MED ORDER — OXYCODONE HCL 5 MG PO TABS: 2.5000 mg | ORAL_TABLET | Freq: Four times a day (QID) | ORAL | 0 refills | Status: DC | PRN

## 2024-04-04 MED ORDER — KETOROLAC TROMETHAMINE 30 MG/ML IJ SOLN
INTRAMUSCULAR | Status: DC | PRN
Start: 2024-04-04 — End: 2024-04-04
  Administered 2024-04-04: 15 mg via INTRAVENOUS

## 2024-04-04 MED ORDER — PROPOFOL 10 MG/ML IV BOLUS
INTRAVENOUS | Status: DC | PRN
  Administered 2024-04-04: 200 mg via INTRAVENOUS
  Administered 2024-04-04: 50 mg via INTRAVENOUS

## 2024-04-04 MED ORDER — CEFAZOLIN SODIUM-DEXTROSE 2-4 GM/100ML-% IV SOLN
2.0000 g | INTRAVENOUS | Status: AC
  Administered 2024-04-04: 2 g via INTRAVENOUS

## 2024-04-04 MED ORDER — PROPOFOL 10 MG/ML IV BOLUS
INTRAVENOUS | Status: AC
  Filled 2024-04-04: qty 20

## 2024-04-04 MED ORDER — ONDANSETRON HCL 4 MG/2ML IJ SOLN
INTRAMUSCULAR | Status: AC
  Filled 2024-04-04: qty 2

## 2024-04-04 MED ORDER — PHENYLEPHRINE HCL (PRESSORS) 10 MG/ML IV SOLN
INTRAVENOUS | Status: DC | PRN
  Administered 2024-04-04: 80 ug via INTRAVENOUS
  Administered 2024-04-04: 160 ug via INTRAVENOUS
  Administered 2024-04-04 (×3): 80 ug via INTRAVENOUS

## 2024-04-04 MED ORDER — DEXAMETHASONE SODIUM PHOSPHATE 10 MG/ML IJ SOLN
INTRAMUSCULAR | Status: AC
  Filled 2024-04-04: qty 1

## 2024-04-04 MED ORDER — LIDOCAINE 2% (20 MG/ML) 5 ML SYRINGE
INTRAMUSCULAR | Status: AC
  Filled 2024-04-04: qty 5

## 2024-04-04 MED ORDER — LIDOCAINE 2% (20 MG/ML) 5 ML SYRINGE
INTRAMUSCULAR | Status: DC | PRN
  Administered 2024-04-04: 60 mg via INTRAVENOUS

## 2024-04-04 MED ORDER — LACTATED RINGERS IV SOLN
INTRAVENOUS | Status: DC | PRN
Start: 2024-04-04 — End: 2024-04-04

## 2024-04-04 MED ORDER — FENTANYL CITRATE (PF) 100 MCG/2ML IJ SOLN
INTRAMUSCULAR | Status: AC
  Filled 2024-04-04: qty 2

## 2024-04-04 MED ORDER — OXYCODONE HCL 5 MG PO TABS: 5.0000 mg | ORAL_TABLET | Freq: Once | ORAL | Status: DC | PRN

## 2024-04-04 MED ORDER — LIDOCAINE-EPINEPHRINE (PF) 1 %-1:200000 IJ SOLN
INTRAMUSCULAR | Status: DC | PRN
  Administered 2024-04-04: 60 mL

## 2024-04-04 MED ORDER — MIDAZOLAM HCL 5 MG/5ML IJ SOLN
INTRAMUSCULAR | Status: DC | PRN
  Administered 2024-04-04: 2 mg via INTRAVENOUS

## 2024-04-04 MED ORDER — ACETAMINOPHEN 500 MG PO TABS
ORAL_TABLET | ORAL | Status: AC
Start: 2024-04-04 — End: ?
  Filled 2024-04-04: qty 2

## 2024-04-04 MED ORDER — FENTANYL CITRATE (PF) 100 MCG/2ML IJ SOLN
INTRAMUSCULAR | Status: DC | PRN
  Administered 2024-04-04: 50 ug via INTRAVENOUS

## 2024-04-04 MED ORDER — AMISULPRIDE (ANTIEMETIC) 5 MG/2ML IV SOLN: 10.0000 mg | Freq: Once | INTRAVENOUS | Status: DC | PRN

## 2024-04-04 SURGICAL SUPPLY — 48 items
BINDER BREAST LRG (GAUZE/BANDAGES/DRESSINGS) IMPLANT
BINDER BREAST MEDIUM (GAUZE/BANDAGES/DRESSINGS) IMPLANT
BINDER BREAST XLRG (GAUZE/BANDAGES/DRESSINGS) IMPLANT
BINDER BREAST XXLRG (GAUZE/BANDAGES/DRESSINGS) IMPLANT
BLADE SURG 10 STRL SS (BLADE) ×2 IMPLANT
BLADE SURG 15 STRL LF DISP TIS (BLADE) IMPLANT
CANISTER SUC SOCK COL 7IN (MISCELLANEOUS) IMPLANT
CANISTER SUCT 1200ML W/VALVE (MISCELLANEOUS) IMPLANT
CHLORAPREP W/TINT 26 (MISCELLANEOUS) ×2 IMPLANT
CLIP TI LARGE 6 (CLIP) ×2 IMPLANT
CLIP TI MEDIUM 6 (CLIP) IMPLANT
COVER BACK TABLE 60X90IN (DRAPES) ×2 IMPLANT
COVER MAYO STAND STRL (DRAPES) ×2 IMPLANT
COVER PROBE CYLINDRICAL 5X96 (MISCELLANEOUS) ×2 IMPLANT
DERMABOND ADVANCED .7 DNX12 (GAUZE/BANDAGES/DRESSINGS) ×2 IMPLANT
DRAPE LAPAROSCOPIC ABDOMINAL (DRAPES) ×2 IMPLANT
DRAPE UTILITY XL STRL (DRAPES) ×2 IMPLANT
ELECT COATED BLADE 2.86 ST (ELECTRODE) ×2 IMPLANT
ELECTRODE REM PT RTRN 9FT ADLT (ELECTROSURGICAL) ×2 IMPLANT
GAUZE PAD ABD 8X10 STRL (GAUZE/BANDAGES/DRESSINGS) IMPLANT
GAUZE SPONGE 4X4 12PLY STRL LF (GAUZE/BANDAGES/DRESSINGS) ×2 IMPLANT
GLOVE BIO SURGEON STRL SZ 6 (GLOVE) ×2 IMPLANT
GLOVE BIOGEL PI IND STRL 6.5 (GLOVE) ×2 IMPLANT
GLOVE BIOGEL PI IND STRL 7.0 (GLOVE) IMPLANT
GLOVE BIOGEL PI IND STRL 7.5 (GLOVE) IMPLANT
GLOVE SURG SS PI 7.0 STRL IVOR (GLOVE) IMPLANT
GOWN STRL REUS W/ TWL LRG LVL3 (GOWN DISPOSABLE) ×2 IMPLANT
GOWN STRL REUS W/ TWL XL LVL3 (GOWN DISPOSABLE) ×2 IMPLANT
KIT MARKER MARGIN INK (KITS) ×2 IMPLANT
LIGHT WAVEGUIDE WIDE FLAT (MISCELLANEOUS) IMPLANT
NDL HYPO 25X1 1.5 SAFETY (NEEDLE) ×2 IMPLANT
NEEDLE HYPO 25X1 1.5 SAFETY (NEEDLE) ×1 IMPLANT
NS IRRIG 1000ML POUR BTL (IV SOLUTION) ×2 IMPLANT
PACK BASIN DAY SURGERY FS (CUSTOM PROCEDURE TRAY) ×2 IMPLANT
PENCIL SMOKE EVACUATOR (MISCELLANEOUS) ×2 IMPLANT
SLEEVE SCD COMPRESS KNEE MED (STOCKING) ×2 IMPLANT
SPIKE FLUID TRANSFER (MISCELLANEOUS) IMPLANT
SPONGE T-LAP 18X18 ~~LOC~~+RFID (SPONGE) ×2 IMPLANT
STRIP CLOSURE SKIN 1/2X4 (GAUZE/BANDAGES/DRESSINGS) ×2 IMPLANT
SUT MNCRL AB 4-0 PS2 18 (SUTURE) ×2 IMPLANT
SUT SILK 2 0 SH (SUTURE) IMPLANT
SUT VIC AB 2-0 SH 27XBRD (SUTURE) ×2 IMPLANT
SUT VIC AB 3-0 SH 27X BRD (SUTURE) ×2 IMPLANT
SYR CONTROL 10ML LL (SYRINGE) ×2 IMPLANT
TOWEL GREEN STERILE FF (TOWEL DISPOSABLE) ×2 IMPLANT
TRAY FAXITRON CT DISP (TRAY / TRAY PROCEDURE) ×2 IMPLANT
TUBE CONNECTING 20X1/4 (TUBING) IMPLANT
YANKAUER SUCT BULB TIP NO VENT (SUCTIONS) IMPLANT

## 2024-04-04 NOTE — Interval H&P Note (Signed)
 History and Physical Interval Note:  04/04/2024 8:41 AM  Michelle Campos  has presented today for surgery, with the diagnosis of RIGHT BREAST CANCER.  The various methods of treatment have been discussed with the patient and family. After consideration of risks, benefits and other options for treatment, the patient has consented to  Procedure(s) with comments: RIGHT BREAST LUMPECTOMY WITH RADIOACTIVE SEED LOCALIZATION (Right) - RIGHT BREAST SEED LOCALIZED LUMPECTOMY as a surgical intervention.  The patient's history has been reviewed, patient examined, no change in status, stable for surgery.  I have reviewed the patient's chart and labs.  Questions were answered to the patient's satisfaction.     Lockie Rima

## 2024-04-04 NOTE — Discharge Instructions (Addendum)
 [facility] Office Phone Number [phone]  BREAST BIOPSY/ PARTIAL MASTECTOMY: POST OP INSTRUCTIONS  Always review your discharge instruction sheet given to you by the facility where your surgery was performed.  IF YOU HAVE DISABILITY OR FAMILY LEAVE FORMS, YOU MUST BRING THEM TO THE OFFICE FOR PROCESSING.  DO NOT GIVE THEM TO YOUR DOCTOR.  Take 2 tylenol  (acetominophen) three times a day for 3 days.  If you still have pain, add ibuprofen  with food in between if able to take this (if you have kidney issues or stomach issues, do not take ibuprofen ).  If both of those are not enough, add the narcotic pain pill.  If you have had itching in the past with narcotics, you may find it helpful to take a benadryl (diphenhydramine) with the pain pill.  If you find you are needing a lot of this overnight after surgery, call the next morning for a refill.    Prescriptions will not be filled after 5pm or on week-ends. Take your usually prescribed medications unless otherwise directed You should eat very light the first 24 hours after surgery, such as soup, crackers, pudding, etc.  Resume your normal diet the day after surgery. Most patients will experience some swelling and bruising in the breast.  Ice packs and a good support bra will help.  Swelling and bruising can take several days to resolve.  It is common to experience some constipation if taking pain medication after surgery.  Increasing fluid intake and taking a stool softener will usually help or prevent this problem from occurring.  A mild laxative (Milk of Magnesia or Miralax) should be taken according to package directions if there are no bowel movements after 48 hours. Unless discharge instructions indicate otherwise, you may remove your bandages 48 hours after surgery, and you may shower at that time.  You may have steri-strips (small skin tapes) in place directly over the incision.  These strips should be left on the skin at least  for for 7-10 days.    ACTIVITIES:  You may resume regular daily activities (gradually increasing) beginning the next day.  Wearing a good support bra or sports bra (or the breast binder) minimizes pain and swelling.  You may have sexual intercourse when it is comfortable. No heavy lifting for 1-2 weeks (not over around 10 pounds).  You may drive when you no longer are taking prescription pain medication, you can comfortably wear a seatbelt, and you can safely maneuver your car and apply brakes. RETURN TO WORK:  __________3-14 days depending on job. _______________ Elene Griffes should see your doctor in the office for a follow-up appointment approximately two weeks after your surgery.  Your doctor's nurse will typically make your follow-up appointment when she calls you with your pathology report.  Expect your pathology report 3-4 business days after your surgery.  You may call to check if you do not hear from us  after three days.   WHEN TO CALL YOUR DOCTOR: Fever over 101.0 Nausea and/or vomiting. Extreme swelling or bruising. Continued bleeding from incision. Increased pain, redness, or drainage from the incision.  The clinic staff is available to answer your questions during regular business hours.  Please don't hesitate to call and ask to speak to one of the nurses for clinical concerns.  If you have a medical emergency, go to the nearest emergency room or call 911.  A surgeon from [facility] is always on call at the hospital.  For further questions, please visit [email or web address]  Post Anesthesia Home Care Instructions  Activity: Get plenty of rest for the remainder of the day. A responsible individual must stay with you for 24 hours following the procedure.  For the next 24 hours, DO NOT: -Drive a car -Advertising copywriter -Drink alcoholic beverages -Take any medication unless instructed by your physician -Make any legal decisions or sign important  papers.  Meals: Start with liquid foods such as gelatin or soup. Progress to regular foods as tolerated. Avoid greasy, spicy, heavy foods. If nausea and/or vomiting occur, drink only clear liquids until the nausea and/or vomiting subsides. Call your physician if vomiting continues.  Special Instructions/Symptoms: Your throat may feel dry or sore from the anesthesia or the breathing tube placed in your throat during surgery. If this causes discomfort, gargle with warm salt water. The discomfort should disappear within 24 hours.  Next dose of Tylenol  can be taken 2pm today if needed. Next dose of Ibuprofen  can be taken after 3:40pm today if needed.

## 2024-04-04 NOTE — Op Note (Signed)
 Right Breast Radioactive seed localized lumpectomy  Indications: This patient presents with history of right breast cancer, upper outer quadrant, cTis, receptors ER and PR strongly positive  Pre-operative Diagnosis: stage 0 right breast cancer  Post-operative Diagnosis: Same  Surgeon: Lockie Rima   Anesthesia: General endotracheal anesthesia  ASA Class: 2  Procedure Details  The patient was seen in the Holding Room. The risks, benefits, complications, treatment options, and expected outcomes were discussed with the patient. The possibilities of bleeding, infection, the need for additional procedures, failure to diagnose a condition, and creating a complication requiring other procedures or operations were discussed with the patient. The patient concurred with the proposed plan, giving informed consent.  The site of surgery properly noted/marked. The patient was taken to Operating Room # 5, identified, and the procedure verified as right breast seed localized lumpectomy.  The right breast and chest were prepped and draped in standard fashion. A transverse lateral incision was made near the previously placed radioactive seed.  Dissection was carried down around the point of maximum signal intensity. The cautery was used to perform the dissection.   The specimen was inked with the margin marker paint kit.    Specimen radiography confirmed inclusion of the mammographic lesion, the clip, and the seed.  The background signal in the breast was zero.  Additional margins were taken at the cardinal directions other than the anterior border.  Hemostasis was achieved with cautery.  The cavity was marked with clips on each border other than the anterior border. Deep 2-0 vicryls were used to close down the cavity. The wound was irrigated and closed with 3-0 vicryl interrupted deep dermal sutures and 4-0 monocryl running subcuticular suture.      Sterile dressings were applied. At the end of the operation, all  sponge, instrument, and needle counts were correct.   Findings: Seed, clip in specimen.  anterior margin is now skin   Estimated Blood Loss:  min         Specimens: right breast tissue with seed, additional superior, medial, inferior, lateral, posterior margins.           Complications:  None; patient tolerated the procedure well.         Disposition: PACU - hemodynamically stable.         Condition: stable

## 2024-04-04 NOTE — Transfer of Care (Signed)
 Immediate Anesthesia Transfer of Care Note  Patient: Michelle Campos  Procedure(s) Performed: RIGHT BREAST LUMPECTOMY WITH RADIOACTIVE SEED LOCALIZATION (Right: Breast)  Patient Location: PACU  Anesthesia Type:General  Level of Consciousness: sedated  Airway & Oxygen Therapy: Patient Spontanous Breathing and Patient connected to nasal cannula oxygen  Post-op Assessment: Report given to RN  Post vital signs: Reviewed and stable  Last Vitals:  Vitals Value Taken Time  BP 129/70 04/04/24 1021  Temp    Pulse 84 04/04/24 1021  Resp 18 04/04/24 1023  SpO2 98 % 04/04/24 1021  Vitals shown include unfiled device data.  Last Pain:  Vitals:   04/04/24 0745  TempSrc: Axillary  PainSc: 0-No pain      Patients Stated Pain Goal: 5 (04/04/24 0745)  Complications: No notable events documented.

## 2024-04-04 NOTE — Anesthesia Postprocedure Evaluation (Signed)
 Anesthesia Post Note  Patient: Michelle Campos  Procedure(s) Performed: RIGHT BREAST LUMPECTOMY WITH RADIOACTIVE SEED LOCALIZATION (Right: Breast)     Patient location during evaluation: PACU Anesthesia Type: General Level of consciousness: awake and alert, oriented and patient cooperative Pain management: pain level controlled Vital Signs Assessment: post-procedure vital signs reviewed and stable Respiratory status: spontaneous breathing, nonlabored ventilation and respiratory function stable Cardiovascular status: blood pressure returned to baseline and stable Postop Assessment: no apparent nausea or vomiting Anesthetic complications: no   No notable events documented.  Last Vitals:  Vitals:   04/04/24 1030 04/04/24 1045  BP: 106/68 120/76  Pulse: (!) 101 87  Resp: 12 15  Temp: (!) 36.2 C   SpO2: 99% 98%    Last Pain:  Vitals:   04/04/24 1045  TempSrc:   PainSc: 0-No pain                 Jacquelyne Matte

## 2024-04-04 NOTE — Anesthesia Procedure Notes (Addendum)
 Procedure Name: LMA Insertion Date/Time: 04/04/2024 9:11 AM  Performed by: Junius Olive, CRNAPre-anesthesia Checklist: Patient identified, Emergency Drugs available, Suction available and Patient being monitored Patient Re-evaluated:Patient Re-evaluated prior to induction Oxygen Delivery Method: Circle system utilized Preoxygenation: Pre-oxygenation with 100% oxygen Induction Type: IV induction Ventilation: Mask ventilation without difficulty LMA: LMA inserted LMA Size: 4.0 Number of attempts: 1 Airway Equipment and Method: Bite block Placement Confirmation: positive ETCO2 Tube secured with: Tape Dental Injury: Teeth and Oropharynx as per pre-operative assessment

## 2024-04-05 ENCOUNTER — Encounter (HOSPITAL_BASED_OUTPATIENT_CLINIC_OR_DEPARTMENT_OTHER): Payer: Self-pay | Admitting: General Surgery

## 2024-04-05 ENCOUNTER — Encounter (HOSPITAL_COMMUNITY): Payer: Self-pay

## 2024-04-06 LAB — SURGICAL PATHOLOGY

## 2024-04-10 ENCOUNTER — Encounter: Payer: Self-pay | Admitting: *Deleted

## 2024-04-10 DIAGNOSIS — D0511 Intraductal carcinoma in situ of right breast: Secondary | ICD-10-CM

## 2024-04-12 ENCOUNTER — Encounter: Payer: Self-pay | Admitting: Family

## 2024-04-12 ENCOUNTER — Ambulatory Visit: Payer: Medicare (Managed Care) | Admitting: Family

## 2024-04-12 DIAGNOSIS — I10 Essential (primary) hypertension: Secondary | ICD-10-CM | POA: Diagnosis not present

## 2024-04-12 MED ORDER — METOPROLOL TARTRATE 25 MG PO TABS: 25.0000 mg | ORAL_TABLET | Freq: Two times a day (BID) | ORAL | 3 refills | Status: AC

## 2024-04-12 NOTE — Progress Notes (Unsigned)
 Provider: Christean Courts FNP-C  Johniece Hornbaker, Elijio Guadeloupe, NP  Patient Care Team: Winfrey Chillemi, Elijio Guadeloupe, NP as PCP - General (Family Medicine) Auther Bo, RN as Oncology Nurse Navigator Alane Hsu, RN as Oncology Nurse Navigator Murleen Arms, MD as Consulting Physician (Hematology and Oncology)  Extended Emergency Contact Information Primary Emergency Contact: [facility] Address: [address] [place], [place] [address] [place] Phone: [phone] Relation: Sister Secondary Emergency Contact: Johnson,Florinda Address: [address], [place] [address] [name] Phone: [phone] Relation: Sister  Code Status: Full Code  Goals of care: Advanced Directive information    04/04/2024    7:41 AM  Advanced Directives  Does Patient Have a Medical Advance Directive? No  Would patient like information on creating a medical advance directive? No - Patient declined     Chief Complaint  Patient presents with   Follow-up    Follow up -anxiety 2 week follow up she said that medication has help    HPI:  Pt is a 68 y.o. female seen today for an acute visit for evaluation of  Discussed the use of AI scribe software for clinical note transcription with the patient, who gave verbal consent to proceed.  History of Present Illness             Past Medical History:  Diagnosis Date   Arthritis    "right knee" (05/27/2015)   Breast cancer (HCC) 2025   Family history of adverse reaction to anesthesia    "it's hard to bring my father back; more than once too" (05/27/2015)   GERD (gastroesophageal reflux disease)    Hypercalcemia    parathyroid  gland removed   Hypertension    controlled with medication   Obesity (BMI 30-39.9)    Parathyroid  cyst (HCC)    Superficial fungus infection of skin    on chest   Thyroid  cancer (HCC) 2016   Thyroid  mass    "2"   Past Surgical History:  Procedure Laterality Date    BREAST BIOPSY Right 03/03/2024   US  RT BREAST BX W LOC DEV 1ST LESION IMG BX SPEC US  GUIDE 03/03/2024 [facility]   BREAST BIOPSY  03/30/2024   MM RT RADIOACTIVE SEED LOC MAMMO GUIDE 03/30/2024 [facility] MAMMOGR[facility]   BREAST LUMPECTOMY WITH RADIOACTIVE SEED LOCALIZATION Right 04/04/2024   Procedure: RIGHT BREAST LUMPECTOMY WITH RADIOACTIVE SEED LOCALIZATION;  Surgeon: Lockie Rima, MD;  Location: [facility];  Service: General;  Laterality: Right;  RIGHT BREAST SEED LOCALIZED LUMPECTOMY   CESAREAN SECTION  12/01/1995   IRRIGATION AND DEBRIDEMENT SEBACEOUS CYST  ~ 2010   on back   PARATHYROIDECTOMY Right 05/27/2015   PARATHYROIDECTOMY Right 05/27/2015   Procedure: RIGHT PARATHYROIDECTOMY WITH FROZEN SECTION;  Surgeon: Janita Mellow, MD;  Location: [facility];  Service: ENT;  Laterality: Right;   THYROID  LOBECTOMY Right 05/27/2015   THYROIDECTOMY Right 05/27/2015   Procedure: RIGHT THYROID  LOBECTOMY WITH FROZEN SECTION;  Surgeon: Janita Mellow, MD;  Location: [facility];  Service: ENT;  Laterality: Right;   TUBAL LIGATION  12/01/1995    Allergies  Allergen Reactions   Skin Adhesives [Cyanoacrylate] Itching    Outpatient Encounter Medications as of 04/12/2024  Medication Sig   ALPRAZolam  (XANAX ) 0.25 MG tablet Take 1 tablet (0.25 mg total) by mouth 2 (two) times daily as needed for anxiety.   amLODipine  (NORVASC ) 10 MG tablet  TAKE 1 TABLET DAILY   cetirizine  (ZYRTEC ) 10 MG tablet Take 1 tablet (10 mg total) by mouth at bedtime.   Multiple Vitamins-Minerals (MULTIVITAMIN ADULTS 50+ PO) Take by mouth daily.   oxyCODONE  (OXY IR/ROXICODONE ) 5 MG immediate release tablet Take 0.5-1 tablets (2.5-5 mg total) by mouth every 6 (six) hours as needed for severe pain (pain score 7-10).   triamterene -hydrochlorothiazide  (MAXZIDE -25) 37.5-25 MG tablet TAKE 1 TABLET DAILY   [DISCONTINUED] St Johns Wort 300 MG CAPS Take 1 capsule by mouth daily.   No facility-administered encounter medications on file  as of 04/12/2024.    Review of Systems  Immunization History  Administered Date(s) Administered   Fluad Quad(high Dose 65+) 08/18/2021, 09/18/2022   Influenza, Quadrivalent, Recombinant, Inj, Pf 08/24/2018   Influenza,inj,Quad PF,6+ Mos 07/25/2019, 07/19/2020   Influenza-Unspecified 08/24/2018   Moderna Sars-Covid-2 Vaccination 11/28/2020   PFIZER(Purple Top)SARS-COV-2 Vaccination 01/26/2020, 02/17/2020   PNEUMOCOCCAL CONJUGATE-20 09/04/2021   PPD Test 08/07/2019   Pneumococcal Conjugate-13 01/24/2021   Zoster Recombinant(Shingrix) 09/04/2021, 08/26/2022   Pertinent  Health Maintenance Due  Topic Date Due   INFLUENZA VACCINE  06/30/2024   MAMMOGRAM  02/28/2026   DEXA SCAN  07/10/2026      03/22/2023    8:22 AM 03/30/2023    1:50 PM 08/30/2023    8:24 AM 03/21/2024    2:39 PM 04/12/2024    9:34 AM  Fall Risk  Falls in the past year? 0 0 0 0 0  Was there an injury with Fall? 0 0 0 0 0  Fall Risk Category Calculator 0 0 0 0 0  Patient at Risk for Falls Due to No Fall Risks No Fall Risks No Fall Risks No Fall Risks No Fall Risks  Fall risk Follow up Falls evaluation completed  Falls evaluation completed Falls evaluation completed Falls prevention discussed;Falls evaluation completed   Functional Status Survey:    Vitals:   04/12/24 0932 04/12/24 1036  BP: (!) 144/98 (!) 140/88  Pulse: 96   Temp: 97.7 F (36.5 C)   TempSrc: Oral   SpO2: 99%   Weight: 235 lb (106.6 kg)   Height: 5\' 8"  (1.727 m)    Body mass index is 35.73 kg/m. Physical Exam Physical Exam          Labs reviewed: Recent Labs    09/17/23 0849 03/30/24 0730  NA 142 142  K 3.7 3.8  CL 103 103  CO2 31 28  GLUCOSE 94 96  BUN 12 12  CREATININE 0.74 0.77  CALCIUM  9.8 9.9   Recent Labs    09/17/23 0849  AST 17  ALT 15  BILITOT 0.7  PROT 7.5   Recent Labs    09/17/23 0849  WBC 3.9  NEUTROABS 2,118  HGB 13.7  HCT 43.9  MCV 85.7  PLT 277   Lab Results  Component Value Date   TSH  1.96 02/13/2019   Lab Results  Component Value Date   HGBA1C 5.8 (H) 09/17/2023   Lab Results  Component Value Date   CHOL 162 09/17/2023   HDL 43 (L) 09/17/2023   LDLCALC 104 (H) 09/17/2023   TRIG 61 09/17/2023   CHOLHDL 3.8 09/17/2023    Significant Diagnostic Results in last 30 days:  MM Breast Surgical Specimen Result Date: 04/04/2024 CLINICAL DATA:  Post excision of a right breast lesion following radioactive seed localization. Assess surgical specimen. EXAM: SPECIMEN RADIOGRAPH OF THE RIGHT BREAST COMPARISON:  Previous exam(s). FINDINGS: Status post excision of the right breast.  The radioactive seed and biopsy marker clip are present, completely intact, and were marked for pathology. IMPRESSION: Specimen radiograph of the right breast. Electronically Signed   By: Amanda Jungling M.D.   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 Radioactive seed localization is performed in anticipation of lumpectomy. EXAM: MAMMOGRAPHIC GUIDED RADIOACTIVE SEED LOCALIZATION OF THE RIGHT BREAST COMPARISON:  Previous exam(s). FINDINGS: Patient presents for radioactive seed localization prior to RIGHT breast lumpectomy. I met with the patient and we discussed the procedure of seed localization including benefits and alternatives. We discussed the high likelihood of a successful procedure. We discussed the risks of the procedure including infection, bleeding, tissue injury and further surgery. We discussed the low dose of radioactivity involved in the procedure. Informed, written consent was given. The usual time-out protocol was performed immediately prior to the procedure. Using mammographic guidance, sterile technique with chlorhexidine  as skin antisepsis, 1% lidocaine  as local anesthetic, an I-125 radioactive seed was used to localize the ribbon shaped tissue marking clip  associated with the biopsy-proven DCIS using a lateral approach. The follow-up mammogram images confirm that the seed is appropriately position immediately adjacent to the ribbon clip. The images are marked for Dr. Cherlynn Cornfield. Follow-up survey of the patient confirms the presence of the radioactive seed. Order number of I-125 seed: [id number] Total activity: 0.252 mCi Reference Date: 03/24/2024 The patient tolerated the procedure well without apparent immediate complications. [facility] was [facility] with instructions regarding seed removal. 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 Electronically Signed   By: Rinda Cheers M.D.   On: 03/30/2024 14:39    Assessment/Plan Assessment and Plan                Family/ staff Communication: Reviewed plan of care with patient  Labs/tests ordered: None   Next Appointment: No follow-ups on file.   Total time: minutes. Greater than 50% of total time spent doing patient education regarding T2DM,HTN, HLD,chronic back pain,health maintenance including symptom/medication management.   Estil Heman, NP

## 2024-04-18 ENCOUNTER — Ambulatory Visit: Payer: Medicare (Managed Care) | Admitting: Hematology and Oncology

## 2024-04-21 ENCOUNTER — Encounter: Payer: Self-pay | Admitting: *Deleted

## 2024-04-26 ENCOUNTER — Encounter: Payer: Self-pay | Admitting: Family

## 2024-04-26 ENCOUNTER — Ambulatory Visit (INDEPENDENT_AMBULATORY_CARE_PROVIDER_SITE_OTHER): Payer: Medicare (Managed Care) | Admitting: Family

## 2024-04-26 VITALS — BP 136/82 | HR 61 | Temp 97.7°F | Resp 20 | Ht 68.0 in | Wt 239.8 lb

## 2024-04-26 DIAGNOSIS — L989 Disorder of the skin and subcutaneous tissue, unspecified: Secondary | ICD-10-CM

## 2024-04-26 DIAGNOSIS — I10 Essential (primary) hypertension: Secondary | ICD-10-CM | POA: Diagnosis not present

## 2024-04-26 DIAGNOSIS — E782 Mixed hyperlipidemia: Secondary | ICD-10-CM | POA: Diagnosis not present

## 2024-04-26 DIAGNOSIS — R7303 Prediabetes: Secondary | ICD-10-CM

## 2024-04-26 DIAGNOSIS — J3489 Other specified disorders of nose and nasal sinuses: Secondary | ICD-10-CM

## 2024-04-26 NOTE — Progress Notes (Signed)
 Provider: Christean Courts FNP-C  Yuleimy Kretz, Michelle Guadeloupe, NP  Patient Care Team: Michelle Campos, Michelle Guadeloupe, NP as PCP - General (Family Medicine) Michelle Bo, RN as Oncology Nurse Navigator Michelle Hsu, RN as Oncology Nurse Navigator Michelle Arms, MD as Consulting Physician (Hematology and Oncology)  Extended Emergency Contact Information Primary Emergency Contact: [facility] Address: [address] [place], [place] [address] [place] Phone: [phone] Relation: Sister Secondary Emergency Contact: Michelle Campos Address: [address], [place] [address] [name] Phone: [phone] Relation: Sister  Code Status:  Full Code  Goals of care: Advanced Directive information    04/04/2024    7:41 AM  Advanced Directives  Does Patient Have a Medical Advance Directive? No  Would patient like information on creating a medical advance directive? No - Patient declined     Chief Complaint  Patient presents with   Follow-up    2 week follow up regarding blood pressure     Discussed the use of AI scribe software for clinical note transcription with the patient, who gave verbal consent to proceed.  History of Present Illness   Michelle Campos is a 68 year old female with hypertension who presents for a two-week follow-up after starting a new blood pressure medication.  She has been on a new antihypertensive regimen for two weeks, which includes metoprolol , amlodipine , and a combination of triamterene  and hydrochlorothiazide . Her blood pressure has generally decreased, with readings such as 109/72 and 120/81, except for one instance of elevation after consuming salty foods, which she attributes to her diet. This elevation lasted for about two days. She monitors her blood pressure daily and notes that her heart rate has stabilized after initially being higher during the first week of medication.  The new  medication has increased her tendency to nap, which she did not do before. She typically naps for about 10-15 minutes around 5 PM after an early supper. She also feels more relaxed and calm.  She has been experiencing sinus pressure for the past two weeks, particularly in her forehead. She has tried Claritin in the past without relief and is considering using Zyrtec  or another antihistamine that is safe for people with high blood pressure. She mentions a specific antihistamine marketed for people with high blood pressure that she has used before, which clears her symptoms with one dose.  She discusses her diet and exercise routine, noting a recent weight gain from 235 to 239 pounds, attributed to not exercising for 21 days. She plans to return to swimming and aerobic exercises four days a week, aiming to reduce her weight to 225 pounds by the summer. Her dietary habits include a preference for carbohydrates, and she plans to incorporate more protein and seeds like flaxseed and chia into her diet.  No swelling in her legs unless she sits at her desk for extended periods. She also mentions noticing an increase in moles, particularly one on her neck that has grown larger and occasionally bleeds if caught on clothing.    Past Medical History:  Diagnosis Date   Arthritis    "right knee" (05/27/2015)   Breast cancer (HCC) 2025   Family history of adverse reaction to anesthesia    "it's hard to bring my father back; more than once too" (05/27/2015)   GERD (gastroesophageal reflux disease)    Hypercalcemia    parathyroid  gland  removed   Hypertension    controlled with medication   Obesity (BMI 30-39.9)    Parathyroid  cyst (HCC)    Superficial fungus infection of skin    on chest   Thyroid  cancer (HCC) 2016   Thyroid  mass    "2"   Past Surgical History:  Procedure Laterality Date   BREAST BIOPSY Right 03/03/2024   US  RT BREAST BX W LOC DEV 1ST LESION IMG BX SPEC US  GUIDE 03/03/2024 [facility]  MAMMOGRAPHY   BREAST BIOPSY  03/30/2024   MM RT RADIOACTIVE SEED LOC MAMMO GUIDE 03/30/2024 [facility] MAMMOGRAP[facility]   BREAST LUMPECTOMY WITH RADIOACTIVE SEED LOCALIZATION Right 04/04/2024   Procedure: RIGHT BREAST LUMPECTOMY WITH RADIOACTIVE SEED LOCALIZATION;  Surgeon: Lockie Rima, MD;  Location: [facility];  Service: General;  Laterality: Right;  RIGHT BREAST SEED LOCALIZED LUMPECTOMY   CESAREAN SECTION  12/01/1995   IRRIGATION AND DEBRIDEMENT SEBACEOUS CYST  ~ 2010   on back   PARATHYROIDECTOMY Right 05/27/2015   PARATHYROIDECTOMY Right 05/27/2015   Procedure: RIGHT PARATHYROIDECTOMY WITH FROZEN SECTION;  Surgeon: Janita Mellow, MD;  Location: [facility];  Service: ENT;  Laterality: Right;   THYROID  LOBECTOMY Right 05/27/2015   THYROIDECTOMY Right 05/27/2015   Procedure: RIGHT THYROID  LOBECTOMY WITH FROZEN SECTION;  Surgeon: Janita Mellow, MD;  Location: [facility];  Service: ENT;  Laterality: Right;   TUBAL LIGATION  12/01/1995    Allergies  Allergen Reactions   Skin Adhesives [Cyanoacrylate] Itching    Outpatient Encounter Medications as of 04/26/2024  Medication Sig   ALPRAZolam  (XANAX ) 0.25 MG tablet Take 1 tablet (0.25 mg total) by mouth 2 (two) times daily as needed for anxiety.   amLODipine  (NORVASC ) 10 MG tablet TAKE 1 TABLET DAILY   cetirizine  (ZYRTEC ) 10 MG tablet Take 1 tablet (10 mg total) by mouth at bedtime.   metoprolol  tartrate (LOPRESSOR ) 25 MG tablet Take 1 tablet (25 mg total) by mouth 2 (two) times daily.   Multiple Vitamins-Minerals (MULTIVITAMIN ADULTS 50+ PO) Take by mouth daily.   triamterene -hydrochlorothiazide  (MAXZIDE -25) 37.5-25 MG tablet TAKE 1 TABLET DAILY   [DISCONTINUED] oxyCODONE  (OXY IR/ROXICODONE ) 5 MG immediate release tablet Take 0.5-1 tablets (2.5-5 mg total) by mouth every 6 (six) hours as needed for severe pain (pain score 7-10). (Patient not taking: Reported on 04/26/2024)   No facility-administered encounter medications on file as of 04/26/2024.     Review of Systems  Constitutional:  Negative for appetite change, chills, fatigue, fever and unexpected weight change.  HENT:  Positive for sinus pressure. Negative for congestion, ear discharge, ear pain, hearing loss, nosebleeds, postnasal drip, rhinorrhea, sinus pain, sneezing, sore throat, tinnitus and trouble swallowing.   Eyes:  Negative for pain, discharge, redness, itching and visual disturbance.  Respiratory:  Negative for cough, chest tightness, shortness of breath and wheezing.   Cardiovascular:  Negative for chest pain, palpitations and leg swelling.  Gastrointestinal:  Negative for abdominal distention, abdominal pain, constipation, diarrhea, nausea and vomiting.  Endocrine: Negative for cold intolerance, heat intolerance, polydipsia, polyphagia and polyuria.  Genitourinary:  Negative for difficulty urinating, dysuria, flank pain, frequency and urgency.  Musculoskeletal:  Negative for arthralgias, back pain, gait problem, joint swelling, myalgias, neck pain and neck stiffness.  Skin:  Negative for color change, pallor, rash and wound.  Neurological:  Negative for dizziness, weakness, light-headedness, numbness and headaches.    Immunization History  Administered Date(s) Administered   Fluad Quad(high Dose 65+) 08/18/2021, 09/18/2022   Influenza, Quadrivalent, Recombinant, Inj, Pf 08/24/2018   Influenza,inj,Quad  PF,6+ Mos 07/25/2019, 07/19/2020   Influenza-Unspecified 08/24/2018   Moderna Sars-Covid-2 Vaccination 11/28/2020   PFIZER(Purple Top)SARS-COV-2 Vaccination 01/26/2020, 02/17/2020   PNEUMOCOCCAL CONJUGATE-20 09/04/2021   PPD Test 08/07/2019   Pneumococcal Conjugate-13 01/24/2021   Zoster Recombinant(Shingrix) 09/04/2021, 08/26/2022   Pertinent  Health Maintenance Due  Topic Date Due   INFLUENZA VACCINE  06/30/2024   MAMMOGRAM  02/28/2026   DEXA SCAN  07/10/2026      03/22/2023    8:22 AM 03/30/2023    1:50 PM 08/30/2023    8:24 AM 03/21/2024    2:39 PM  04/12/2024    9:34 AM  Fall Risk  Falls in the past year? 0 0 0 0 0  Was there an injury with Fall? 0 0 0 0 0  Fall Risk Category Calculator 0 0 0 0 0  Patient at Risk for Falls Due to No Fall Risks No Fall Risks No Fall Risks No Fall Risks No Fall Risks  Fall risk Follow up Falls evaluation completed  Falls evaluation completed Falls evaluation completed Falls prevention discussed;Falls evaluation completed   Functional Status Survey:    Vitals:   04/26/24 0858  BP: 136/82  Pulse: 61  Resp: 20  Temp: 97.7 F (36.5 C)  SpO2: 99%  Weight: 239 lb 12.8 oz (108.8 kg)  Height: 5\' 8"  (1.727 m)   Body mass index is 36.46 kg/m. Physical Exam VITALS: T- 97.7, P- 61, BP- 136/82, SaO2- 99% MEASUREMENTS: Weight- 239. GENERAL: Alert, cooperative, well developed, no acute distress HEENT: Normocephalic, normal oropharynx, moist mucous membranes CHEST: Clear to auscultation bilaterally, no wheezes, rhonchi, or crackles CARDIOVASCULAR: Normal heart rate and rhythm, S1 and S2 normal without murmurs ABDOMEN: Soft, non-tender, non-distended, without organomegaly, normal bowel sounds EXTREMITIES: No cyanosis or edema NEUROLOGICAL: Cranial nerves grossly intact, moves all extremities without gross motor or sensory deficit SKIN: Multiple small spots on skin, large mole on neck      Labs reviewed: Recent Labs    09/17/23 0849 03/30/24 0730  NA 142 142  K 3.7 3.8  CL 103 103  CO2 31 28  GLUCOSE 94 96  BUN 12 12  CREATININE 0.74 0.77  CALCIUM  9.8 9.9   Recent Labs    09/17/23 0849  AST 17  ALT 15  BILITOT 0.7  PROT 7.5   Recent Labs    09/17/23 0849  WBC 3.9  NEUTROABS 2,118  HGB 13.7  HCT 43.9  MCV 85.7  PLT 277   Lab Results  Component Value Date   TSH 1.96 02/13/2019   Lab Results  Component Value Date   HGBA1C 5.8 (H) 09/17/2023   Lab Results  Component Value Date   CHOL 162 09/17/2023   HDL 43 (L) 09/17/2023   LDLCALC 104 (H) 09/17/2023   TRIG 61  09/17/2023   CHOLHDL 3.8 09/17/2023    Significant Diagnostic Results in last 30 days:  MM Breast Surgical Specimen Result Date: 04/04/2024 CLINICAL DATA:  Post excision of a right breast lesion following radioactive seed localization. Assess surgical specimen. EXAM: SPECIMEN RADIOGRAPH OF THE RIGHT BREAST COMPARISON:  Previous exam(s). FINDINGS: Status post excision of the right breast. The radioactive seed and biopsy marker clip are present, completely intact, and were marked for pathology. IMPRESSION: Specimen radiograph of the right breast. Electronically Signed   By: Amanda Jungling M.D.   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 Radioactive seed localization is performed in anticipation of lumpectomy. EXAM: MAMMOGRAPHIC GUIDED RADIOACTIVE SEED LOCALIZATION OF THE RIGHT BREAST COMPARISON:  Previous exam(s). FINDINGS: Patient presents for radioactive seed localization prior to RIGHT breast lumpectomy. I met with the patient and we discussed the procedure of seed localization including benefits and alternatives. We discussed the high likelihood of a successful procedure. We discussed the risks of the procedure including infection, bleeding, tissue injury and further surgery. We discussed the low dose of radioactivity involved in the procedure. Informed, written consent was given. The usual time-out protocol was performed immediately prior to the procedure. Using mammographic guidance, sterile technique with chlorhexidine  as skin antisepsis, 1% lidocaine  as local anesthetic, an I-125 radioactive seed was used to localize the ribbon shaped tissue marking clip associated with the biopsy-proven DCIS using a lateral approach. The follow-up mammogram images confirm that the seed is appropriately position immediately adjacent to the ribbon clip. The images are marked for  Dr. Cherlynn Cornfield. Follow-up survey of the patient confirms the presence of the radioactive seed. Order number of I-125 seed: [id number] Total activity: 0.252 mCi Reference Date: 03/24/2024 The patient tolerated the procedure well without apparent immediate complications. She was released from the [facility] with instructions regarding seed removal. 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 Electronically Signed   By: Rinda Cheers M.D.   On: 03/30/2024 14:39    Assessment/Plan  Sinus pressure Persistent sinus pressure in the forehead area, unrelieved by Claritin. Previously effective antihistamine for individuals with hypertension was used. Zyrtec  and Xyzal cause sedation. Adequate hydration is recommended for symptom relief. - Use previously effective antihistamine Coricidin HP for sinus pressure relief. - Avoid antihistamines with decongestants to prevent rebound effects. - Ensure adequate hydration.  Hypertension Blood pressure improved with current medication regimen, with occasional spikes due to dietary salt intake. Current readings are within target range. Increased relaxation and occasional naps may be related to medication effects. No significant adverse effects from antihypertensive regimen. - Continue metoprolol , amlodipine , and triamterene /hydrochlorothiazide . - Monitor blood pressure daily at home. - Report blood pressure readings <100/60 mmHg or heart rate <60 bpm. - Encourage dietary modifications to reduce salt and sugar intake. - Increase physical activity, including swimming and aerobic exercises, for weight management. - Target weight loss to 225 lbs by summer. - Increase protein intake to manage hunger and support weight loss.  Hyperlipidemia  -dietary modification and exercise advised   Prediabetes  A1C 5.8 improved from 6.0  dietary modification and exercise advised   Skin Lesion  Black colored  raised skin lesion on right side of neck without any erythema and non-tender to palpation  - Refer to dermatology   Family/ staff Communication: Reviewed plan of care with patient verbalized understanding   Labs/tests ordered:  - CBC with Differential/Platelet - CMP with eGFR([facility]) - TSH - Hgb A1C - Lipid panel   Next Appointment: Return in about 6 months (around 10/27/2024) for medical mangement of chronic issues., Fasting labs in 6 months prior to visit.   Total time: 30 minutes. Greater than 50% of total time spent doing patient education regarding Prediabetes,HTN, HLD,sinus pressure,health maintenance including symptom/medication management.   Estil Heman, NP

## 2024-04-27 NOTE — Addendum Note (Signed)
 Addended byChristean Courts C on: 04/27/2024 10:34 PM   Modules accepted: Orders

## 2024-05-09 ENCOUNTER — Telehealth: Payer: Self-pay

## 2024-05-09 ENCOUNTER — Encounter: Payer: Self-pay | Admitting: Radiation Oncology

## 2024-05-09 NOTE — Telephone Encounter (Signed)
 Verbally confirmed appt for 6/11

## 2024-05-09 NOTE — Progress Notes (Signed)
 New Breast Cancer Diagnosis:   Did patient present with symptoms (if so, please note symptoms) or screening mammography?: Screening Mammogram  Location and Extent of disease : 8 mm mass at the 9:00 position of the right breast.  Histology per Pathology Report: grade grade 1, DCIS  REPORT OF SURGICAL PATHOLOGY   Accession #: [id number]  Patient Name: Michelle Campos, Michelle Campos  Visit # : [id number]   MRN: [id number]  Physician: Coralyn Derry  DOB/Age 68-Nov-1957 (Age: 68) Gender: F  Collected Date: 03/03/2024  Received Date: 03/03/2024   FINAL DIAGNOSIS        1. Breast, right, needle core biopsy, outer, 9 o'clock, 10cmfn :       -  DUCTAL CARCINOMA IN SITU, LOW-GRADE, INVOLVING A PAPILLOMA       NECROSIS: NOT IDENTIFIED       CALCIFICATIONS: NOT IDENTIFIED       DCIS LENGTH: 0.6 CM   Receptor Status: ER(positive), PR (positive)   Surgeon and surgical plan, if any: Dr. Cherlynn Cornfield -Breast Lumpectomy with radioactive seed localization 04/04/2024  Medical oncologist, treatment if any:  Dr. Arno Bibles 04/18/2024  Family History of Breast/Ovarian/Prostate Cancer: YES- Brother at 34yrs old- Multiple Myeloma   Lymphedema issues, if any: None  Pain issues, if any: Yes- Tenderness 1/10 RT breast (incision line), sharp. One occurrence.  SAFETY ISSUES: Prior radiation? NO Pacemaker/ICD? NO Possible current pregnancy? No- postmenopausal Is the patient on methotrexate? NO  Current Complaints / other details:  None   Vitals limited via phone: Ht 5\' 8"  (1.727 m)   Wt 235 lb (106.6 kg)   BMI 35.73 kg/m   This concludes the interaction.  Avery Bodo, LPN

## 2024-05-10 ENCOUNTER — Ambulatory Visit
Admission: RE | Admit: 2024-05-10 | Discharge: 2024-05-10 | Disposition: A | Discharge status: Home / Self Care | Payer: Medicare (Managed Care) | Source: Ambulatory Visit | Attending: Radiation Oncology | Admitting: Radiation Oncology

## 2024-05-10 ENCOUNTER — Inpatient Hospital Stay: Payer: Medicare (Managed Care) | Attending: Hematology and Oncology | Admitting: Hematology and Oncology

## 2024-05-10 ENCOUNTER — Other Ambulatory Visit: Payer: Self-pay | Admitting: Nurse Practitioner

## 2024-05-10 ENCOUNTER — Ambulatory Visit: Admission: RE | Admit: 2024-05-10 | Payer: Medicare (Managed Care) | Source: Ambulatory Visit

## 2024-05-10 VITALS — BP 147/73 | HR 69 | Temp 98.7°F | Resp 18 | Wt 241.1 lb

## 2024-05-10 VITALS — Ht 68.0 in | Wt 235.0 lb

## 2024-05-10 DIAGNOSIS — C73 Malignant neoplasm of thyroid gland: Secondary | ICD-10-CM | POA: Insufficient documentation

## 2024-05-10 DIAGNOSIS — D0511 Intraductal carcinoma in situ of right breast: Secondary | ICD-10-CM

## 2024-05-10 DIAGNOSIS — I10 Essential (primary) hypertension: Secondary | ICD-10-CM | POA: Diagnosis not present

## 2024-05-10 DIAGNOSIS — Z87891 Personal history of nicotine dependence: Secondary | ICD-10-CM | POA: Diagnosis not present

## 2024-05-10 DIAGNOSIS — Z85850 Personal history of malignant neoplasm of thyroid: Secondary | ICD-10-CM

## 2024-05-10 DIAGNOSIS — Z809 Family history of malignant neoplasm, unspecified: Secondary | ICD-10-CM | POA: Insufficient documentation

## 2024-05-10 NOTE — Progress Notes (Addendum)
 Radiation Oncology         [phone] ________________________________  Outpatient Follow Up - Conducted via telephone at patient request.  I spoke with the patient to conduct this visit via telephone. The patient was notified in advance and was offered an in person or telemedicine meeting to allow for face to face communication but instead preferred to proceed with a telephone visit.    Name: Michelle Campos        MRN: [id number]  Date of Service: 05/10/2024 DOB: 1956/02/27  [id number], Michelle Guadeloupe, NP  Michelle Arms, MD     REFERRING PHYSICIAN: Murleen Arms, MD   DIAGNOSIS: The encounter diagnosis was Ductal carcinoma in situ (DCIS) of right breast.   HISTORY OF PRESENT ILLNESS: Michelle Campos is a 68 y.o. female with a diagnosis of right breast cancer.  The patient was being followed with diagnostic imaging after screening mammogram in 2023 indicated a possible mass in the left breast..  This was followed with ultrasound and in June 2024 the area had decreased in size and felt overall to represent benign features.  She returned for diagnostic mammography on 02/29/2024 which showed resolution of the previously seen mass in the 3 o'clock position of the left breast.  However in the right breast, a new finding of a 6 mm mass was seen without calcifications.  Targeted ultrasound that day located a mass in the 9 o'clock position measuring 8 mm in greatest dimension with possible fluid debris level versus hypoechoic solid component.  The axilla of the right side was negative for any adenopathy, and a biopsy on 03/03/2024 of the right breast showed low-grade DCIS involving the papilloma, her DCIS was ER/PR positive.   Since her last visit, the patient underwent a right lumpectomy with Dr. Cherlynn Campos on 04/04/2024.  Pathology showed a low-grade DCIS involving an intraductal papilloma with no evidence of invasive disease.  Her margins were negative by greater than 10 mm, and additional excision  specimens of the superior, medial, inferior, lateral, and posterior sites were negative.  She is contacted by phone again to review the rationale for adjuvant radiation.    PREVIOUS RADIATION THERAPY: No   PAST MEDICAL HISTORY:  Past Medical History:  Diagnosis Date   Arthritis    right knee (05/27/2015)   Breast cancer (HCC) 2025   Family history of adverse reaction to anesthesia    it's hard to bring my father back; more than once too (05/27/2015)   GERD (gastroesophageal reflux disease)    Hypercalcemia    parathyroid  gland removed   Hypertension    controlled with medication   Obesity (BMI 30-39.9)    Parathyroid  cyst (HCC)    Superficial fungus infection of skin    on chest   Thyroid  cancer (HCC) 2016   Thyroid  mass    2       PAST SURGICAL HISTORY: Past Surgical History:  Procedure Laterality Date   BREAST BIOPSY Right 03/03/2024   US  RT BREAST BX W LOC DEV 1ST LESION IMG BX SPEC US  GUIDE 03/03/2024 [facility]   BREAST BIOPSY  03/30/2024   MM RT RADIOACTIVE SEED LOC MAMMO GUIDE 03/30/2024 [facility]   BREAST LUMPECTOMY WITH RADIOACTIVE SEED LOCALIZATION Right 04/04/2024   Procedure: RIGHT BREAST LUMPECTOMY WITH RADIOACTIVE SEED LOCALIZATION;  Surgeon: Michelle Rima, MD;  Location:  SURGERY CENTER;  Service: General;  Laterality: Right;  RIGHT BREAST SEED LOCALIZED LUMPECTOMY   CESAREAN SECTION  12/01/1995   IRRIGATION AND DEBRIDEMENT SEBACEOUS CYST  ~  2010   on back   PARATHYROIDECTOMY Right 05/27/2015   PARATHYROIDECTOMY Right 05/27/2015   Procedure: RIGHT PARATHYROIDECTOMY WITH FROZEN SECTION;  Surgeon: Janita Mellow, MD;  Location: [facility] OR;  Service: ENT;  Laterality: Right;   THYROID  LOBECTOMY Right 05/27/2015   THYROIDECTOMY Right 05/27/2015   Procedure: RIGHT THYROID  LOBECTOMY WITH FROZEN SECTION;  Surgeon: Janita Mellow, MD;  Location: [facility] OR;  Service: ENT;  Laterality: Right;   TUBAL LIGATION  12/01/1995     FAMILY HISTORY:  Family  History  Problem Relation Age of Onset   Congestive Heart Failure Mother    Diabetes Mother 52   Hypertension Mother    Congestive Heart Failure Father    Multiple myeloma Brother 24   Diabetes Sister 30   Hypertension Sister    Hypertension Brother    Hypertension Brother    Hyperlipidemia Brother    Hypertension Brother    Hyperlipidemia Brother      SOCIAL HISTORY:  reports that she has quit smoking. Her smoking use included cigarettes. She has a 1 pack-year smoking history. She has never used smokeless tobacco. She reports current alcohol use. She reports that she does not use drugs.  The patient is divorced and lives in [place].  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 She is very active and enjoys spending time with her sister exercising each day and swimming laps at the [facility].    ALLERGIES: Skin adhesives [cyanoacrylate]   MEDICATIONS:  Current Outpatient Medications  Medication Sig Dispense Refill   ALPRAZolam  (XANAX ) 0.25 MG tablet Take 1 tablet (0.25 mg total) by mouth 2 (two) times daily as needed for anxiety. 30 tablet 0   amLODipine  (NORVASC ) 10 MG tablet TAKE 1 TABLET DAILY 90 tablet 3   cetirizine  (ZYRTEC ) 10 MG tablet Take 1 tablet (10 mg total) by mouth at bedtime. 30 tablet 11   metoprolol  tartrate (LOPRESSOR ) 25 MG tablet Take 1 tablet (25 mg total) by mouth 2 (two) times daily. 180 tablet 3   Multiple Vitamins-Minerals (MULTIVITAMIN ADULTS 50+ PO) Take by mouth daily.     triamterene -hydrochlorothiazide  (MAXZIDE -25) 37.5-25 MG tablet TAKE 1 TABLET DAILY 90 tablet 3   No current facility-administered medications for this encounter.     REVIEW OF SYSTEMS: On review of systems, the patient reports that she is doing well since surgery without concerns or complaints.      PHYSICAL EXAM:    Unable to assess given encounter type.   ECOG = 0  0 - Asymptomatic (Fully active, able to carry on all  predisease activities without restriction)  1 - Symptomatic but completely ambulatory (Restricted in physically strenuous activity but ambulatory and able to carry out work of a light or sedentary nature. For example, light housework, office work)  2 - Symptomatic, <50% in bed during the day (Ambulatory and capable of all self care but unable to carry out any work activities. Up and about more than 50% of waking hours)  3 - Symptomatic, >50% in bed, but not bedbound (Capable of only limited self-care, confined to bed or chair 50% or more of waking hours)  4 - Bedbound (Completely disabled. Cannot carry on any self-care. Totally confined to bed or chair)  5 - Death   Aurea Blossom MM, Creech RH, Tormey DC, et al. [phone]). Toxicity and response criteria of the [facility]. Am. Hillard Lowes. Oncol. 5 (6): 649-55    LABORATORY DATA:  Lab Results  Component  Value Date   WBC 3.9 09/17/2023   HGB 13.7 09/17/2023   HCT 43.9 09/17/2023   MCV 85.7 09/17/2023   PLT 277 09/17/2023   Lab Results  Component Value Date   NA 142 03/30/2024   K 3.8 03/30/2024   CL 103 03/30/2024   CO2 28 03/30/2024   Lab Results  Component Value Date   ALT 15 09/17/2023   AST 17 09/17/2023   BILITOT 0.7 09/17/2023      RADIOGRAPHY: No results found.      IMPRESSION/PLAN: 1. Low-grade ER/PR positive DCIS involving a papilloma of the right breast. Dr. Jeryl Moris has reviewed her final pathology findings and she appears to be doing well since surgery.  We reviewed the rationale for external radiotherapy to the breast  to reduce risks of local recurrence. Dr. Arno Bibles has recommended adjuvant antiestrogen therapy to follow. We discussed the risks, benefits, short, and long term effects of radiotherapy, as well as the curative intent, and the patient is interested in proceeding.  I discussed the delivery and logistics of radiotherapy and Dr. Jeryl Moris recommends 4 weeks of radiotherapy to the right breast. She  will come tomorrow for simulation at which time she will sign written consent to proceed.      This encounter was conducted via telephone.  The patient has provided two factor identification and has given verbal consent for this type of encounter and has been advised to only accept a meeting of this type in a secure network environment. The time spent during this encounter was 45 minutes including preparation, discussion, and coordination of the patient's care. The attendants for this meeting include Bettejane Brownie  and [employer].  During the encounter,  Bettejane Brownie was located at [facility] Radiation Oncology Department.  Danika Johnson-Joy was located at home.  The above documentation reflects my direct findings during this shared patient visit. Please see the separate note by Dr. Jeryl Moris on this date for the remainder of the patient's plan of care.    Shelvia Dick, [facility]    **Disclaimer: This note was dictated with voice recognition software. Similar sounding words can inadvertently be transcribed and this note may contain transcription errors which may not have been corrected upon publication of note.**

## 2024-05-10 NOTE — Assessment & Plan Note (Signed)
 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 Lumpectomy completed. Radiation therapy planned. Hormonal therapy post-radiation to prevent recurrence. Tamoxifen not suitable due to endometrial hyperplasia history. Aromatase inhibitors considered. Discussed about mechanism of action, adverse effects of each class of drugs in detail. - Refer to radiation oncologist for radiation therapy. - Initiate hormonal therapy post-radiation with an aromatase inhibitor for five years - Perform bone density scan every two years if on aromatase inhibitors. - Continue diagnostic mammograms for the first two years post-treatment followed by screening mammograms.  Thyroid  cancer History of thyroid  cancer with right parathyroidectomy and right thyroidectomy. No current thyroid  medication needed.  She is scheduled for genetic testing today.  Murleen Arms MD

## 2024-05-10 NOTE — Progress Notes (Signed)
 [facility] CONSULT NOTE  Patient Care Team: Ngetich, Elijio Guadeloupe, NP as PCP - General (Family Medicine) Auther Bo, RN as Oncology Nurse Navigator Alane Hsu, RN as Oncology Nurse Navigator Murleen Arms, MD as Consulting Physician (Hematology and Oncology)  CHIEF COMPLAINTS/PURPOSE OF CONSULTATION:  Newly diagnosed breast cancer  HISTORY OF PRESENTING ILLNESS:  Michelle Campos 68 y.o. female is here because of recent diagnosis of right breast DCIS  I reviewed her records extensively and collaborated the history with the patient.  SUMMARY OF ONCOLOGIC HISTORY: Oncology History  Ductal carcinoma in situ (DCIS) of right breast  02/29/2024 Mammogram    Indeterminate mass at the right breast 9 o'clock position may reflect a complicated cyst with fluid debris level versus complex solid and cystic mass. Recommend ultrasound-guided biopsy for definitive characterization. 2. No right axillary lymphadenopathy. 3. Interval resolution of the mass at the left breast 3 o'clock position. No mammographic findings of malignancy the left breast.   03/27/2024 Initial Diagnosis   Ductal carcinoma in situ (DCIS) of right breast   04/04/2024 Pathology Results   BREAST, RIGHT, LUMPECTOMY:  - Low-grade ductal carcinoma in situ, involving an intraductal papilloma  - No evidence of invasive carcinoma  - Resection margins are negative for DCIS   Estrogen Receptor: 99%, positive, strong staining intensity       Progesterone Receptor: 80%, positive, strong staining intensity    05/10/2024 Cancer Staging   Staging form: Breast, AJCC 8th Edition - Clinical: Stage 0 (cTis (DCIS), cN0, cM0, G1, ER+, PR+) - Signed by Murleen Arms, MD on 05/10/2024 Stage prefix: Initial diagnosis Histologic grading system: 3 grade system     Discussed the use of AI scribe software for clinical note transcription with the patient, who gave verbal consent to proceed.  History of Present  Illness Michelle Campos is a 68 year old female with ductal carcinoma in situ (DCIS) who presents for follow-up after lumpectomy.  She underwent a lumpectomy for DCIS located in the right breast at the nine o'clock position. The DCIS was identified as estrogen receptor (ER) positive and progesterone receptor (PR) positive. She is healing well from the surgery without any complications. She has an appointment scheduled with the radiation oncologist tomorrow.  There is a history of a spot on the left breast at the three o'clock position, which was monitored every six months and has since resolved. The current concern is the right breast, where the DCIS was located and removed.  Her past medical history includes a right parathyroidectomy and right hemi thyroidectomy for thyroid  cancer. She does not take levothyroxine as only half of the thyroid  was removed. She also has a history of endometrial hyperplasia, which was previously evaluated by an oncologist.  She experiences occasional hot flashes but no significant menopausal symptoms. No history of blood clots. Her family history includes multiple myeloma in a relative, her grandmother had a toe removed due to cancer, and her grandfather had stomach cancer. There is no family history of breast cancer.  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   MEDICAL HISTORY:  Past Medical History:  Diagnosis Date   Arthritis    right knee (05/27/2015)   Breast cancer (HCC) 2025   Family history of adverse reaction to anesthesia    it's hard to bring my father back; more than once too (05/27/2015)   GERD (gastroesophageal reflux disease)    Hypercalcemia    parathyroid   gland removed   Hypertension    controlled with medication   Obesity (BMI 30-39.9)    Parathyroid  cyst (HCC)    Superficial fungus infection of skin    on chest   Thyroid  cancer (HCC) 2016   Thyroid  mass    2     SURGICAL HISTORY: Past Surgical History:  Procedure Laterality Date   BREAST BIOPSY Right 03/03/2024   US  RT BREAST BX W LOC DEV 1ST LESION IMG BX SPEC US  GUIDE 03/03/2024 [facility]   BREAST BIOPSY  03/30/2024   MM RT RADIOACTIVE SEED LOC MAMMO GUIDE 03/30/2024 [facility]   BREAST LUMPECTOMY WITH RADIOACTIVE SEED LOCALIZATION Right 04/04/2024   Procedure: RIGHT BREAST LUMPECTOMY WITH RADIOACTIVE SEED LOCALIZATION;  Surgeon: Lockie Rima, MD;  Location:  SURGERY CENTER;  Service: General;  Laterality: Right;  RIGHT BREAST SEED LOCALIZED LUMPECTOMY   CESAREAN SECTION  12/01/1995   IRRIGATION AND DEBRIDEMENT SEBACEOUS CYST  ~ 2010   on back   PARATHYROIDECTOMY Right 05/27/2015   PARATHYROIDECTOMY Right 05/27/2015   Procedure: RIGHT PARATHYROIDECTOMY WITH FROZEN SECTION;  Surgeon: Janita Mellow, MD;  Location: [facility] OR;  Service: ENT;  Laterality: Right;   THYROID  LOBECTOMY Right 05/27/2015   THYROIDECTOMY Right 05/27/2015   Procedure: RIGHT THYROID  LOBECTOMY WITH FROZEN SECTION;  Surgeon: Janita Mellow, MD;  Location: [facility] OR;  Service: ENT;  Laterality: Right;   TUBAL LIGATION  12/01/1995    SOCIAL HISTORY: Social History   Socioeconomic History   Marital status: Divorced    Spouse name: Not on file   Number of children: Not on file   Years of education: Not on file   Highest education level: Bachelor's degree (e.g., BA, AB, BS)  Occupational History   Not on file  Tobacco Use   Smoking status: Former    Current packs/day: 0.25    Average packs/day: 0.3 packs/day for 4.0 years (1.0 ttl pk-yrs)    Types: Cigarettes   Smokeless tobacco: Never   Tobacco comments:    quit smoking cigarettes in the 1970's  Vaping Use   Vaping status: Never Used  Substance and Sexual Activity   Alcohol use: Yes    Comment: maybe once or twice a year   Drug use: No   Sexual activity: Yes  Other Topics Concern   Not on file  Social History Narrative   Social History       Diet?       Do you drink/eat things with caffeine? Yes, soft drinks, coffee (occasionally)      Marital status?           divorced                         What year were you married? 1992      Do you live in a house, apartment, assisted living, condo, trailer, etc.? house      Is it one or more stories? 2 stories      How many persons live in your home? 2      Do you have any pets in your home? (please list) no      Highest level of education completed? Some graduate studies      Current or past profession: Environmental health practitioner      Do you exercise?        yes  Type & how often? 3 x week- bike, treadmill      Advanced Directives      Do you have a living will? no      Do you have a DNR form?    no                              If not, do you want to discuss one? no      Do you have signed POA/HPOA for forms? no      Functional Status      Do you have difficulty bathing or dressing yourself? no      Do you have difficulty preparing food or eating? no      Do you have difficulty managing your medications? no      Do you have difficulty managing your finances?  no      Do you have difficulty affording your medications?   Social Drivers of Corporate investment banker Strain: Low Risk  (04/09/2024)   Overall Financial Resource Strain (CARDIA)    Difficulty of Paying Living Expenses: Not very hard  Food Insecurity: No Food Insecurity (05/09/2024)   Hunger Vital Sign    Worried About Running Out of Food in the Last Year: Never true    Ran Out of Food in the Last Year: Never true  Transportation Needs: No Transportation Needs (05/09/2024)   PRAPARE - Administrator, Civil Service (Medical): No    Lack of Transportation (Non-Medical): No  Physical Activity: Sufficiently Active (04/09/2024)   Exercise Vital Sign    Days of Exercise per Week: 4 days    Minutes of Exercise per Session: 60 min  Stress: Stress Concern Present  (04/09/2024)   [employer] of Occupational Health - Occupational Stress Questionnaire    Feeling of Stress : Rather much  Social Connections: Moderately Integrated (04/09/2024)   Social Connection and Isolation Panel [NHANES]    Frequency of Communication with Friends and Family: More than three times a week    Frequency of Social Gatherings with Friends and Family: More than three times a week    Attends Religious Services: 1 to 4 times per year    Active Member of [employer] or Organizations: Yes    Attends Engineer, structural: More than 4 times per year    Marital Status: Divorced  Intimate Partner Violence: Not At Risk (05/09/2024)   Humiliation, Afraid, Rape, and Kick questionnaire    Fear of Current or Ex-Partner: No    Emotionally Abused: No    Physically Abused: No    Sexually Abused: No    FAMILY HISTORY: Family History  Problem Relation Age of Onset   Congestive Heart Failure Mother    Diabetes Mother 27   Hypertension Mother    Congestive Heart Failure Father    Multiple myeloma Brother 89   Diabetes Sister 30   Hypertension Sister    Hypertension Brother    Hypertension Brother    Hyperlipidemia Brother    Hypertension Brother    Hyperlipidemia Brother     ALLERGIES:  is allergic to skin adhesives [cyanoacrylate].  MEDICATIONS:  Current Outpatient Medications  Medication Sig Dispense Refill   ALPRAZolam  (XANAX ) 0.25 MG tablet Take 1 tablet (0.25 mg total) by mouth 2 (two) times daily as needed for anxiety. 30 tablet 0   amLODipine  (NORVASC ) 10 MG tablet TAKE 1 TABLET DAILY 90 tablet 3  cetirizine  (ZYRTEC ) 10 MG tablet Take 1 tablet (10 mg total) by mouth at bedtime. 30 tablet 11   metoprolol  tartrate (LOPRESSOR ) 25 MG tablet Take 1 tablet (25 mg total) by mouth 2 (two) times daily. 180 tablet 3   Multiple Vitamins-Minerals (MULTIVITAMIN ADULTS 50+ PO) Take by mouth daily.     triamterene -hydrochlorothiazide  (MAXZIDE -25) 37.5-25 MG tablet TAKE 1  TABLET DAILY 90 tablet 3   No current facility-administered medications for this visit.    All other systems were reviewed with the patient and are negative.  PHYSICAL EXAMINATION: ECOG PERFORMANCE STATUS: 0 - Asymptomatic  Vitals:   05/10/24 0921  BP: (!) 147/73  Pulse: 69  Resp: 18  Temp: 98.7 F (37.1 C)  SpO2: 100%   Filed Weights   05/10/24 0921  Weight: 241 lb 1.6 oz (109.4 kg)    GENERAL:alert, no distress and comfortable Right breast healing well.  LABORATORY DATA:  I have reviewed the data as listed Lab Results  Component Value Date   WBC 3.9 09/17/2023   HGB 13.7 09/17/2023   HCT 43.9 09/17/2023   MCV 85.7 09/17/2023   PLT 277 09/17/2023   Lab Results  Component Value Date   NA 142 03/30/2024   K 3.8 03/30/2024   CL 103 03/30/2024   CO2 28 03/30/2024    RADIOGRAPHIC STUDIES: I have personally reviewed the radiological reports and agreed with the findings in the report.  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 Lumpectomy completed. Radiation therapy planned. Hormonal therapy post-radiation to prevent recurrence. Tamoxifen not suitable due to endometrial hyperplasia history. Aromatase inhibitors considered. Discussed about mechanism of action, adverse effects of each class of drugs in detail. - Refer to radiation oncologist for radiation therapy. - Initiate hormonal therapy post-radiation with an aromatase inhibitor for five years - Perform bone density scan every two years if on aromatase inhibitors. - Continue diagnostic mammograms for the first two years post-treatment followed by screening mammograms.  Thyroid  cancer History of thyroid  cancer with right parathyroidectomy and right thyroidectomy. No current thyroid  medication needed.  She is scheduled for genetic testing today.  Murleen Arms  MD     All questions were answered. The patient knows to call the clinic with any problems, questions or concerns.    Murleen Arms, MD 05/10/24

## 2024-05-11 ENCOUNTER — Encounter: Payer: Self-pay | Admitting: *Deleted

## 2024-05-11 ENCOUNTER — Ambulatory Visit
Admission: RE | Admit: 2024-05-11 | Discharge: 2024-05-11 | Disposition: A | Discharge status: Home / Self Care | Payer: Medicare (Managed Care) | Source: Ambulatory Visit | Attending: Radiation Oncology | Admitting: Radiation Oncology

## 2024-05-11 DIAGNOSIS — Z510 Encounter for antineoplastic radiation therapy: Secondary | ICD-10-CM | POA: Diagnosis not present

## 2024-05-11 DIAGNOSIS — D0511 Intraductal carcinoma in situ of right breast: Secondary | ICD-10-CM

## 2024-05-17 ENCOUNTER — Inpatient Hospital Stay: Payer: Medicare (Managed Care) | Admitting: Genetic Counselor

## 2024-05-17 ENCOUNTER — Inpatient Hospital Stay: Payer: Medicare (Managed Care)

## 2024-05-17 ENCOUNTER — Other Ambulatory Visit: Payer: Self-pay | Admitting: Genetic Counselor

## 2024-05-17 ENCOUNTER — Encounter: Payer: Self-pay | Admitting: Genetic Counselor

## 2024-05-17 DIAGNOSIS — Z1379 Encounter for other screening for genetic and chromosomal anomalies: Secondary | ICD-10-CM

## 2024-05-17 DIAGNOSIS — D0511 Intraductal carcinoma in situ of right breast: Secondary | ICD-10-CM | POA: Diagnosis not present

## 2024-05-17 DIAGNOSIS — Z510 Encounter for antineoplastic radiation therapy: Secondary | ICD-10-CM | POA: Diagnosis not present

## 2024-05-17 DIAGNOSIS — Z853 Personal history of malignant neoplasm of breast: Secondary | ICD-10-CM | POA: Diagnosis not present

## 2024-05-17 DIAGNOSIS — C73 Malignant neoplasm of thyroid gland: Secondary | ICD-10-CM

## 2024-05-17 DIAGNOSIS — Z809 Family history of malignant neoplasm, unspecified: Secondary | ICD-10-CM

## 2024-05-17 DIAGNOSIS — Z85850 Personal history of malignant neoplasm of thyroid: Secondary | ICD-10-CM | POA: Diagnosis not present

## 2024-05-17 DIAGNOSIS — Z8041 Family history of malignant neoplasm of ovary: Secondary | ICD-10-CM | POA: Diagnosis not present

## 2024-05-17 LAB — GENETIC SCREENING ORDER

## 2024-05-17 NOTE — Progress Notes (Signed)
 REFERRING PROVIDER: Lockie Rima, MD  PRIMARY PROVIDER:  Ngetich, Elijio Guadeloupe, NP  PRIMARY REASON FOR VISIT:  1. Ductal carcinoma in situ (DCIS) of right breast   2. Thyroid  cancer (HCC)   3. Family history of cancer    HISTORY OF PRESENT ILLNESS:   Michelle Campos, a 68 y.o. female, was seen for a [place] cancer genetics consultation at the request of Dr. Cherlynn Campos due to a personal and family history of cancer.  Michelle Campos presents to clinic today to discuss the possibility of a hereditary predisposition to cancer, to discuss genetic testing, and to further clarify her future cancer risks, as well as potential cancer risks for family members.   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 She also has a history of papillary thyroid  cancer diagnosed at age 24.  CANCER HISTORY:  Oncology History  Ductal carcinoma in situ (DCIS) of right breast  02/29/2024 Mammogram    Indeterminate mass at the right breast 9 o'clock position may reflect a complicated cyst with fluid debris level versus complex solid and cystic mass. Recommend ultrasound-guided biopsy for definitive characterization. 2. No right axillary lymphadenopathy. 3. Interval resolution of the mass at the left breast 3 o'clock position. No mammographic findings of malignancy the left breast.   03/27/2024 Initial Diagnosis   Ductal carcinoma in situ (DCIS) of right breast   04/04/2024 Pathology Results   BREAST, RIGHT, LUMPECTOMY:  - Low-grade ductal carcinoma in situ, involving an intraductal papilloma  - No evidence of invasive carcinoma  - Resection margins are negative for DCIS   Estrogen Receptor: 99%, positive, strong staining intensity       Progesterone Receptor: 80%, positive, strong staining intensity    05/10/2024 Cancer Staging   Staging form: Breast, AJCC 8th Edition - Clinical: Stage 0 (cTis (DCIS), cN0, cM0, G1, ER+, PR+) -  Signed by Murleen Arms, MD on 05/10/2024 Stage prefix: Initial diagnosis Histologic grading system: 3 grade system      RISK FACTORS:  First live birth at age 29.  OCP use for approximately 22 years.  Ovaries intact: yes.  Uterus intact: yes.  Menopausal status: postmenopausal.  HRT use: 0 years. Colonoscopy: yes; she reports a normal colonoscopy. Mammogram within the last year: yes. Any excessive radiation exposure in the past: no  Past Medical History:  Diagnosis Date   Arthritis    right knee (05/27/2015)   Breast cancer (HCC) 2025   Family history of adverse reaction to anesthesia    it's hard to bring my father back; more than once too (05/27/2015)   GERD (gastroesophageal reflux disease)    Hypercalcemia    parathyroid  gland removed   Hypertension    controlled with medication   Obesity (BMI 30-39.9)    Parathyroid  cyst (HCC)    Superficial fungus infection of skin    on chest   Thyroid  cancer (HCC) 2016   Thyroid  mass    2    Past Surgical History:  Procedure Laterality Date   BREAST BIOPSY Right 03/03/2024   US  RT BREAST BX W LOC DEV 1ST LESION IMG BX SPEC US  GUIDE 03/03/2024 [facility]   BREAST BIOPSY  03/30/2024   MM RT RADIOACTIVE SEED LOC MAMMO GUIDE 03/30/2024 [facility]   BREAST LUMPECTOMY WITH RADIOACTIVE SEED LOCALIZATION Right 04/04/2024   Procedure: RIGHT BREAST LUMPECTOMY WITH RADIOACTIVE SEED LOCALIZATION;  Surgeon: Lockie Rima, MD;  Location: [facility];  Service: General;  Laterality: Right;  RIGHT BREAST SEED LOCALIZED LUMPECTOMY   CESAREAN SECTION  12/01/1995   IRRIGATION AND DEBRIDEMENT SEBACEOUS CYST  ~ 2010   on back   PARATHYROIDECTOMY Right 05/27/2015   PARATHYROIDECTOMY Right 05/27/2015   Procedure: RIGHT PARATHYROIDECTOMY WITH FROZEN SECTION;  Surgeon: Janita Mellow, MD;  Location: [facility] OR;  Service: ENT;  Laterality: Right;   THYROID  LOBECTOMY Right 05/27/2015   THYROIDECTOMY Right 05/27/2015    Procedure: RIGHT THYROID  LOBECTOMY WITH FROZEN SECTION;  Surgeon: Janita Mellow, MD;  Location: [facility];  Service: ENT;  Laterality: Right;   TUBAL LIGATION  12/01/1995    Social History   Socioeconomic History   Marital status: Divorced    Spouse name: Not on file   Number of children: Not on file   Years of education: Not on file   Highest education level: Bachelor's degree (e.g., BA, AB, BS)  Occupational History   Not on file  Tobacco Use   Smoking status: Former    Current packs/day: 0.25    Average packs/day: 0.3 packs/day for 4.0 years (1.0 ttl pk-yrs)    Types: Cigarettes   Smokeless tobacco: Never   Tobacco comments:    quit smoking cigarettes in the 1970's  Vaping Use   Vaping status: Never Used  Substance and Sexual Activity   Alcohol use: Yes    Comment: maybe once or twice a year   Drug use: No   Sexual activity: Yes  Other Topics Concern   Not on file  Social History Narrative   Social History      Diet?       Do you drink/eat things with caffeine? Yes, soft drinks, coffee (occasionally)      Marital status?           divorced                         What year were you married? 1992      Do you live in a house, apartment, assisted living, condo, trailer, etc.? house      Is it one or more stories? 2 stories      How many persons live in your home? 2      Do you have any pets in your home? (please list) no      Highest level of education completed? Some graduate studies      Current or past profession: Environmental health practitioner      Do you exercise?        yes                              Type & how often? 3 x week- bike, treadmill      Advanced Directives      Do you have a living will? no      Do you have a DNR form?    no                              If not, do you want to discuss one? no      Do you have signed POA/HPOA for forms? no      Functional Status      Do you have difficulty bathing or dressing yourself? no      Do you have  difficulty preparing food or  eating? no      Do you have difficulty managing your medications? no      Do you have difficulty managing your finances?  no      Do you have difficulty affording your medications?   Social Drivers of Corporate investment banker Strain: Low Risk  (04/09/2024)   Overall Financial Resource Strain (CARDIA)    Difficulty of Paying Living Expenses: Not very hard  Food Insecurity: No Food Insecurity (05/10/2024)   Hunger Vital Sign    Worried About Running Out of Food in the Last Year: Never true    Ran Out of Food in the Last Year: Never true  Transportation Needs: No Transportation Needs (05/10/2024)   PRAPARE - Administrator, Civil Service (Medical): No    Lack of Transportation (Non-Medical): No  Physical Activity: Sufficiently Active (04/09/2024)   Exercise Vital Sign    Days of Exercise per Week: 4 days    Minutes of Exercise per Session: 60 min  Stress: Stress Concern Present (04/09/2024)   [employer] of Occupational Health - Occupational Stress Questionnaire    Feeling of Stress : Rather much  Social Connections: Moderately Integrated (04/09/2024)   Social Connection and Isolation Panel    Frequency of Communication with Friends and Family: More than three times a week    Frequency of Social Gatherings with Friends and Family: More than three times a week    Attends Religious Services: 1 to 4 times per year    Active Member of [employer] or Organizations: Yes    Attends Engineer, structural: More than 4 times per year    Marital Status: Divorced     FAMILY HISTORY:  We obtained a detailed, 4-generation family history.  Significant diagnoses are listed below: Family History  Problem Relation Age of Onset   Multiple myeloma Brother 32   Lung cancer Maternal Uncle        heavy smoker   Ovarian cancer Maternal Grandmother        possibly uterine cancer     Ms. Guardiola is unaware of previous family history of genetic  testing for hereditary cancer risks. There is no reported Ashkenazi Jewish ancestry.   GENETIC COUNSELING ASSESSMENT: Ms. Warga is a 68 y.o. female with a personal and family history of cancer which is somewhat suggestive of a hereditary predisposition to cancer. We, therefore, discussed and recommended the following at today's visit.   DISCUSSION: We discussed that 5 - 10% of cancer is hereditary, with most cases of breast cancer associated with BRCA1/2.  There are other genes that can be associated with hereditary breast cancer syndromes.  We discussed that testing is beneficial for several reasons including knowing how to follow individuals after completing their treatment and understanding if other family members could be at risk for cancer and allowing them to undergo genetic testing.   We reviewed the characteristics, features and inheritance patterns of hereditary cancer syndromes. We also discussed genetic testing, including the appropriate family members to test, the process of testing, insurance coverage and turn-around-time for results. We discussed the implications of a negative, positive, carrier and/or variant of uncertain significant result. We recommended Michelle Campos pursue genetic testing for a panel that includes genes associated with breast, thyroid , uterine, and ovarian cancer.   Ms. Straka  was offered a common hereditary cancer panel (40 genes+ thyroid  cancer genes) and an expanded pan-cancer panel (77 genes). Ms. Pete was informed of the benefits and limitations of  each panel, including that expanded pan-cancer panels contain genes that do not have clear management guidelines at this point in time.  We also discussed that as the number of genes included on a panel increases, the chances of variants of uncertain significance increases. After considering the benefits and limitations of each gene panel, Ms. Feltman elected to have [facility]Next Panel+thyroid   cancer genes.  The [facility] CancerNext+RNAinsight Panel includes sequencing, rearrangement analysis, and RNA analysis for the following 40 genes: APC, ATM, BAP1, BARD1, BMPR1A, BRCA1, BRCA2, BRIP1, CDH1, CDKN2A, CHEK2, FH, FLCN, MET, MLH1, MSH2, MSH6, MUTYH, NF1, NTHL1, PALB2, PMS2, PTEN, RAD51C, RAD51D, RPS20, SMAD4, STK11, TP53, TSC1, TSC2, and VHL (sequencing and deletion/duplication); AXIN2, HOXB13, MBD4, MSH3, POLD1 and POLE (sequencing only); EPCAM and GREM1 (deletion/duplication only). We also added 3 additional thyroid  cancer genes: DICER1, PRKAR1A, and RET.   Based on Michelle Campos's personal and family history of cancer, she meets medical criteria for genetic testing. Despite that she meets criteria, she may still have an out of pocket cost. We discussed that if her out of pocket cost for testing is over $100, the laboratory should contact them to discuss self-pay prices, patient pay assistance programs, if applicable, and other billing options.  PLAN: After considering the risks, benefits, and limitations, Ms. Ferrer provided informed consent to pursue genetic testing and the blood sample was sent to [facility] for analysis of the [facility] Panel. Results should be available within approximately 2-3 weeks' time, at which point they will be disclosed by telephone to Michelle Campos, as will any additional recommendations warranted by these results. Ms. Sprankle will receive a summary of her genetic counseling visit and a copy of her results once available. This information will also be available in Epic.   Ms. Laboy questions were answered to her satisfaction today. Our contact information was provided should additional questions or concerns arise. Thank you for the referral and allowing us  to share in the care of your patient.   Jerian Morais, MS, [facility] Genetic Counselor [email or web address] (P) [phone]  60 minutes were spent on the date of the  encounter in service to the patient including preparation, face-to-face consultation, documentation and care coordination. The patient was seen alone.  Drs. Gudena and/or Maryalice Smaller were available to discuss this case as needed.   _______________________________________________________________________ For Office Staff:  Number of people involved in session: 1 Was an Intern/ student involved with case: no

## 2024-05-18 ENCOUNTER — Ambulatory Visit: Payer: Medicare (Managed Care) | Admitting: Radiation Oncology

## 2024-05-19 ENCOUNTER — Ambulatory Visit: Payer: Medicare (Managed Care)

## 2024-05-22 ENCOUNTER — Ambulatory Visit
Admission: RE | Admit: 2024-05-22 | Discharge: 2024-05-22 | Discharge status: Home / Self Care | Payer: Medicare (Managed Care) | Source: Ambulatory Visit | Attending: Radiation Oncology | Admitting: Radiation Oncology

## 2024-05-22 ENCOUNTER — Other Ambulatory Visit: Payer: Self-pay

## 2024-05-22 DIAGNOSIS — Z510 Encounter for antineoplastic radiation therapy: Secondary | ICD-10-CM | POA: Diagnosis not present

## 2024-05-22 LAB — RAD ONC ARIA SESSION SUMMARY
Course Elapsed Days: 0
Plan Fractions Treated to Date: 1
Plan Prescribed Dose Per Fraction: 2.66 Gy
Plan Total Fractions Prescribed: 16
Plan Total Prescribed Dose: 42.56 Gy
Reference Point Dosage Given to Date: 2.66 Gy
Reference Point Session Dosage Given: 2.66 Gy
Session Number: 1

## 2024-05-23 ENCOUNTER — Ambulatory Visit
Admission: RE | Admit: 2024-05-23 | Discharge: 2024-05-23 | Disposition: A | Discharge status: Home / Self Care | Payer: Medicare (Managed Care) | Source: Ambulatory Visit | Attending: Radiation Oncology

## 2024-05-23 ENCOUNTER — Other Ambulatory Visit: Payer: Self-pay

## 2024-05-23 DIAGNOSIS — Z510 Encounter for antineoplastic radiation therapy: Secondary | ICD-10-CM | POA: Diagnosis not present

## 2024-05-23 LAB — RAD ONC ARIA SESSION SUMMARY
Course Elapsed Days: 1
Plan Fractions Treated to Date: 2
Plan Prescribed Dose Per Fraction: 2.66 Gy
Plan Total Fractions Prescribed: 16
Plan Total Prescribed Dose: 42.56 Gy
Reference Point Dosage Given to Date: 5.32 Gy
Reference Point Session Dosage Given: 2.66 Gy
Session Number: 2

## 2024-05-24 ENCOUNTER — Other Ambulatory Visit: Payer: Self-pay

## 2024-05-24 ENCOUNTER — Ambulatory Visit
Admission: RE | Admit: 2024-05-24 | Discharge: 2024-05-24 | Disposition: A | Discharge status: Home / Self Care | Payer: Medicare (Managed Care) | Source: Ambulatory Visit | Attending: Radiation Oncology

## 2024-05-24 DIAGNOSIS — Z510 Encounter for antineoplastic radiation therapy: Secondary | ICD-10-CM | POA: Diagnosis not present

## 2024-05-24 LAB — RAD ONC ARIA SESSION SUMMARY
Course Elapsed Days: 2
Plan Fractions Treated to Date: 3
Plan Prescribed Dose Per Fraction: 2.66 Gy
Plan Total Fractions Prescribed: 16
Plan Total Prescribed Dose: 42.56 Gy
Reference Point Dosage Given to Date: 7.98 Gy
Reference Point Session Dosage Given: 2.66 Gy
Session Number: 3

## 2024-05-25 ENCOUNTER — Other Ambulatory Visit: Payer: Self-pay

## 2024-05-25 ENCOUNTER — Ambulatory Visit
Admission: RE | Admit: 2024-05-25 | Discharge: 2024-05-25 | Disposition: A | Discharge status: Home / Self Care | Payer: Medicare (Managed Care) | Source: Ambulatory Visit | Attending: Radiation Oncology | Admitting: Radiation Oncology

## 2024-05-25 DIAGNOSIS — Z510 Encounter for antineoplastic radiation therapy: Secondary | ICD-10-CM | POA: Diagnosis not present

## 2024-05-25 LAB — RAD ONC ARIA SESSION SUMMARY
Course Elapsed Days: 3
Plan Fractions Treated to Date: 4
Plan Prescribed Dose Per Fraction: 2.66 Gy
Plan Total Fractions Prescribed: 16
Plan Total Prescribed Dose: 42.56 Gy
Reference Point Dosage Given to Date: 10.64 Gy
Reference Point Session Dosage Given: 2.66 Gy
Session Number: 4

## 2024-05-26 ENCOUNTER — Ambulatory Visit: Payer: Medicare (Managed Care)

## 2024-05-29 ENCOUNTER — Other Ambulatory Visit: Payer: Self-pay | Admitting: Nurse Practitioner

## 2024-05-29 ENCOUNTER — Other Ambulatory Visit: Payer: Self-pay

## 2024-05-29 ENCOUNTER — Ambulatory Visit
Admission: RE | Admit: 2024-05-29 | Discharge: 2024-05-29 | Disposition: A | Discharge status: Home / Self Care | Payer: Medicare (Managed Care) | Source: Ambulatory Visit | Attending: Radiation Oncology | Admitting: Radiation Oncology

## 2024-05-29 ENCOUNTER — Ambulatory Visit
Admission: RE | Admit: 2024-05-29 | Discharge: 2024-05-29 | Disposition: A | Discharge status: Home / Self Care | Payer: Medicare (Managed Care) | Source: Ambulatory Visit | Attending: Radiation Oncology

## 2024-05-29 DIAGNOSIS — D0511 Intraductal carcinoma in situ of right breast: Secondary | ICD-10-CM

## 2024-05-29 DIAGNOSIS — Z510 Encounter for antineoplastic radiation therapy: Secondary | ICD-10-CM | POA: Diagnosis not present

## 2024-05-29 LAB — RAD ONC ARIA SESSION SUMMARY
Course Elapsed Days: 7
Plan Fractions Treated to Date: 5
Plan Prescribed Dose Per Fraction: 2.66 Gy
Plan Total Fractions Prescribed: 16
Plan Total Prescribed Dose: 42.56 Gy
Reference Point Dosage Given to Date: 13.3 Gy
Reference Point Session Dosage Given: 2.66 Gy
Session Number: 5

## 2024-05-29 MED ORDER — RADIAPLEXRX EX GEL: Freq: Once | CUTANEOUS | Status: AC

## 2024-05-29 MED ORDER — ALRA NON-METALLIC DEODORANT (RAD-ONC)
1.0000 | Freq: Once | TOPICAL | Status: AC
  Administered 2024-05-29: 1 via TOPICAL

## 2024-05-30 ENCOUNTER — Ambulatory Visit
Admission: RE | Admit: 2024-05-30 | Discharge: 2024-05-30 | Disposition: A | Discharge status: Home / Self Care | Payer: Medicare (Managed Care) | Source: Ambulatory Visit | Attending: Radiation Oncology | Admitting: Radiation Oncology

## 2024-05-30 ENCOUNTER — Other Ambulatory Visit: Payer: Self-pay

## 2024-05-30 DIAGNOSIS — D0511 Intraductal carcinoma in situ of right breast: Secondary | ICD-10-CM | POA: Insufficient documentation

## 2024-05-30 DIAGNOSIS — Z510 Encounter for antineoplastic radiation therapy: Secondary | ICD-10-CM | POA: Diagnosis not present

## 2024-05-30 LAB — RAD ONC ARIA SESSION SUMMARY
Course Elapsed Days: 8
Plan Fractions Treated to Date: 6
Plan Prescribed Dose Per Fraction: 2.66 Gy
Plan Total Fractions Prescribed: 16
Plan Total Prescribed Dose: 42.56 Gy
Reference Point Dosage Given to Date: 15.96 Gy
Reference Point Session Dosage Given: 2.66 Gy
Session Number: 6

## 2024-05-31 ENCOUNTER — Other Ambulatory Visit: Payer: Self-pay

## 2024-05-31 ENCOUNTER — Ambulatory Visit
Admission: RE | Admit: 2024-05-31 | Discharge: 2024-05-31 | Disposition: A | Discharge status: Home / Self Care | Payer: Medicare (Managed Care) | Source: Ambulatory Visit | Attending: Radiation Oncology

## 2024-05-31 ENCOUNTER — Telehealth: Payer: Self-pay

## 2024-05-31 DIAGNOSIS — Z510 Encounter for antineoplastic radiation therapy: Secondary | ICD-10-CM | POA: Diagnosis not present

## 2024-05-31 LAB — RAD ONC ARIA SESSION SUMMARY
Course Elapsed Days: 9
Plan Fractions Treated to Date: 7
Plan Prescribed Dose Per Fraction: 2.66 Gy
Plan Total Fractions Prescribed: 16
Plan Total Prescribed Dose: 42.56 Gy
Reference Point Dosage Given to Date: 18.62 Gy
Reference Point Session Dosage Given: 2.66 Gy
Session Number: 7

## 2024-05-31 NOTE — Telephone Encounter (Signed)
 Pt verbally confirmed appt for 7/3

## 2024-06-01 ENCOUNTER — Other Ambulatory Visit: Payer: Self-pay

## 2024-06-01 ENCOUNTER — Inpatient Hospital Stay: Payer: Medicare (Managed Care) | Attending: Hematology and Oncology | Admitting: Hematology and Oncology

## 2024-06-01 ENCOUNTER — Ambulatory Visit
Admission: RE | Admit: 2024-06-01 | Discharge: 2024-06-01 | Disposition: A | Discharge status: Home / Self Care | Payer: Medicare (Managed Care) | Source: Ambulatory Visit | Attending: Radiation Oncology | Admitting: Radiation Oncology

## 2024-06-01 VITALS — BP 128/72 | HR 78 | Temp 98.7°F | Resp 16 | Ht 68.0 in | Wt 237.3 lb

## 2024-06-01 DIAGNOSIS — Z510 Encounter for antineoplastic radiation therapy: Secondary | ICD-10-CM | POA: Diagnosis not present

## 2024-06-01 DIAGNOSIS — Z79811 Long term (current) use of aromatase inhibitors: Secondary | ICD-10-CM | POA: Insufficient documentation

## 2024-06-01 DIAGNOSIS — D0511 Intraductal carcinoma in situ of right breast: Secondary | ICD-10-CM | POA: Diagnosis not present

## 2024-06-01 LAB — RAD ONC ARIA SESSION SUMMARY
Course Elapsed Days: 10
Plan Fractions Treated to Date: 8
Plan Prescribed Dose Per Fraction: 2.66 Gy
Plan Total Fractions Prescribed: 16
Plan Total Prescribed Dose: 42.56 Gy
Reference Point Dosage Given to Date: 21.28 Gy
Reference Point Session Dosage Given: 2.2876 Gy
Session Number: 8

## 2024-06-01 MED ORDER — ANASTROZOLE 1 MG PO TABS: 1.0000 mg | ORAL_TABLET | Freq: Every day | ORAL | 3 refills | Status: AC

## 2024-06-01 NOTE — Assessment & Plan Note (Addendum)
 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 Lumpectomy completed. Radiation therapy planned. Hormonal therapy post-radiation to prevent recurrence. Tamoxifen not suitable due to endometrial hyperplasia history. Aromatase inhibitors considered. Discussed about mechanism of action, adverse effects of each class of drugs in detail. - Initiate anastrozole mid-August post-radiation to reduce risk of recurrence - Discussed anastrozole side effects including but not limited to joint stiffness, hot flashes, vaginal dryness and bone density loss. - Monitor bone density with scans every two years. - Encourage walking and vitamin D  supplementation for bone density. - Arrange for [facility] to mail anastrozole prescription.

## 2024-06-01 NOTE — Progress Notes (Signed)
 [facility] CONSULT NOTE  Patient Care Team: Ngetich, Roxan BROCKS, NP as PCP - General (Family Medicine) Glean Stephane BROCKS, RN (Inactive) as Oncology Nurse Navigator Tyree Nanetta SAILOR, RN as Oncology Nurse Navigator Loretha Ash, MD as Consulting Physician (Hematology and Oncology)  CHIEF COMPLAINTS/PURPOSE OF CONSULTATION:  Newly diagnosed breast cancer  HISTORY OF PRESENTING ILLNESS:  Danita Proud 68 y.o. female is here because of recent diagnosis of right breast DCIS  I reviewed her records extensively and collaborated the history with the patient.  SUMMARY OF ONCOLOGIC HISTORY: Oncology History  Ductal carcinoma in situ (DCIS) of right breast  02/29/2024 Mammogram    Indeterminate mass at the right breast 9 o'clock position may reflect a complicated cyst with fluid debris level versus complex solid and cystic mass. Recommend ultrasound-guided biopsy for definitive characterization. 2. No right axillary lymphadenopathy. 3. Interval resolution of the mass at the left breast 3 o'clock position. No mammographic findings of malignancy the left breast.   03/27/2024 Initial Diagnosis   Ductal carcinoma in situ (DCIS) of right breast   04/04/2024 Pathology Results   BREAST, RIGHT, LUMPECTOMY:  - Low-grade ductal carcinoma in situ, involving an intraductal papilloma  - No evidence of invasive carcinoma  - Resection margins are negative for DCIS   Estrogen Receptor: 99%, positive, strong staining intensity       Progesterone Receptor: 80%, positive, strong staining intensity    05/10/2024 Cancer Staging   Staging form: Breast, AJCC 8th Edition - Clinical: Stage 0 (cTis (DCIS), cN0, cM0, G1, ER+, PR+) - Signed by Loretha Ash, MD on 05/10/2024 Stage prefix: Initial diagnosis Histologic grading system: 3 grade system     Discussed the use of AI scribe software for clinical note transcription with the patient, who gave verbal consent to proceed.  History of  Present Illness  Zyara Riling is a 68 year old female with ductal carcinoma in situ (DCIS) who presents for follow-up on her radiation treatment.  She is currently undergoing radiation therapy for DCIS and has completed approximately six treatments, with an expected completion by the end of July. She reports no issues with the radiation therapy, stating 'everything's good' and 'no problems with it'.  She is postmenopausal and has been considering antiestrogen therapy options post-radiation, including tamoxifen, anastrozole, and letrozole. She is inclined towards anastrozole due to concerns about the risk of blood clots. She plans to start this medication in mid-August after completing radiation therapy.  She has a history of normal bone density as of 2022 but acknowledges it has been almost three years since her last scan. She is aware that bone density loss can occur with antiestrogen therapy.  Socially, she is planning a trip to [place] in mid-August to celebrate her sister's 60th birthday with her family. She describes the celebration as a large gathering, similar to a wedding, with a band, singer, and catered food, and expresses excitement about the event.   MEDICAL HISTORY:  Past Medical History:  Diagnosis Date   Arthritis    right knee (05/27/2015)   Breast cancer (HCC) 2025   Family history of adverse reaction to anesthesia    it's hard to bring my father back; more than once too (05/27/2015)   GERD (gastroesophageal reflux disease)    Hypercalcemia    parathyroid  gland removed   Hypertension    controlled with medication   Obesity (BMI 30-39.9)    Parathyroid  cyst (HCC)    Superficial fungus infection of skin    on  chest   Thyroid  cancer (HCC) 2016   Thyroid  mass    2    SURGICAL HISTORY: Past Surgical History:  Procedure Laterality Date   BREAST BIOPSY Right 03/03/2024   US  RT BREAST BX W LOC DEV 1ST LESION IMG BX SPEC US  GUIDE 03/03/2024 [facility]  MAMMOGRAPHY   BREAST BIOPSY  03/30/2024   MM RT RADIOACTIVE SEED LOC MAMMO GUIDE 03/30/2024 [facility] MAMMOGRAPHY   BREAST LUMPECTOMY WITH RADIOACTIVE SEED LOCALIZATION Right 04/04/2024   Procedure: RIGHT BREAST LUMPECTOMY WITH RADIOACTIVE SEED LOCALIZATION;  Surgeon: Aron Shoulders, MD;  Location: [facility];  Service: General;  Laterality: Right;  RIGHT BREAST SEED LOCALIZED LUMPECTOMY   CESAREAN SECTION  12/01/1995   IRRIGATION AND DEBRIDEMENT SEBACEOUS CYST  ~ 2010   on back   PARATHYROIDECTOMY Right 05/27/2015   PARATHYROIDECTOMY Right 05/27/2015   Procedure: RIGHT PARATHYROIDECTOMY WITH FROZEN SECTION;  Surgeon: Ida Loader, MD;  Location: [facility] OR;  Service: ENT;  Laterality: Right;   THYROID  LOBECTOMY Right 05/27/2015   THYROIDECTOMY Right 05/27/2015   Procedure: RIGHT THYROID  LOBECTOMY WITH FROZEN SECTION;  Surgeon: Ida Loader, MD;  Location: [facility] OR;  Service: ENT;  Laterality: Right;   TUBAL LIGATION  12/01/1995    SOCIAL HISTORY: Social History   Socioeconomic History   Marital status: Divorced    Spouse name: Not on file   Number of children: Not on file   Years of education: Not on file   Highest education level: Bachelor's degree (e.g., BA, AB, BS)  Occupational History   Not on file  Tobacco Use   Smoking status: Former    Current packs/day: 0.25    Average packs/day: 0.3 packs/day for 4.0 years (1.0 ttl pk-yrs)    Types: Cigarettes   Smokeless tobacco: Never   Tobacco comments:    quit smoking cigarettes in the 1970's  Vaping Use   Vaping status: Never Used  Substance and Sexual Activity   Alcohol use: Yes    Comment: maybe once or twice a year   Drug use: No   Sexual activity: Yes  Other Topics Concern   Not on file  Social History Narrative   Social History      Diet?       Do you drink/eat things with caffeine? Yes, soft drinks, coffee (occasionally)      Marital status?           divorced                         What year were you married?  1992      Do you live in a house, apartment, assisted living, condo, trailer, etc.? house      Is it one or more stories? 2 stories      How many persons live in your home? 2      Do you have any pets in your home? (please list) no      Highest level of education completed? Some graduate studies      Current or past profession: Environmental health practitioner      Do you exercise?        yes                              Type & how often? 3 x week- bike, treadmill      Advanced Directives      Do you have a living  will? no      Do you have a DNR form?    no                              If not, do you want to discuss one? no      Do you have signed POA/HPOA for forms? no      Functional Status      Do you have difficulty bathing or dressing yourself? no      Do you have difficulty preparing food or eating? no      Do you have difficulty managing your medications? no      Do you have difficulty managing your finances?  no      Do you have difficulty affording your medications?   Social Drivers of Corporate investment banker Strain: Low Risk  (04/09/2024)   Overall Financial Resource Strain (CARDIA)    Difficulty of Paying Living Expenses: Not very hard  Food Insecurity: No Food Insecurity (05/10/2024)   Hunger Vital Sign    Worried About Running Out of Food in the Last Year: Never true    Ran Out of Food in the Last Year: Never true  Transportation Needs: No Transportation Needs (05/10/2024)   PRAPARE - Administrator, Civil Service (Medical): No    Lack of Transportation (Non-Medical): No  Physical Activity: Sufficiently Active (04/09/2024)   Exercise Vital Sign    Days of Exercise per Week: 4 days    Minutes of Exercise per Session: 60 min  Stress: Stress Concern Present (04/09/2024)   [employer] of Occupational Health - Occupational Stress Questionnaire    Feeling of Stress : Rather much  Social Connections: Moderately Integrated (04/09/2024)   Social  Connection and Isolation Panel    Frequency of Communication with Friends and Family: More than three times a week    Frequency of Social Gatherings with Friends and Family: More than three times a week    Attends Religious Services: 1 to 4 times per year    Active Member of [employer] or Organizations: Yes    Attends Banker Meetings: More than 4 times per year    Marital Status: Divorced  Intimate Partner Violence: Unknown (05/10/2024)   Humiliation, Afraid, Rape, and Kick questionnaire    Fear of Current or Ex-Partner: No    Emotionally Abused: No    Physically Abused: No    Sexually Abused: Not on file    FAMILY HISTORY: Family History  Problem Relation Age of Onset   Congestive Heart Failure Mother    Diabetes Mother 47   Hypertension Mother    Congestive Heart Failure Father    Diabetes Sister 75   Hypertension Sister    Multiple myeloma Brother 80   Hypertension Brother    Hypertension Brother    Hyperlipidemia Brother    Hypertension Brother    Hyperlipidemia Brother    Lung cancer Maternal Uncle        heavy smoker   Ovarian cancer Maternal Grandmother        possibly uterine cancer    ALLERGIES:  is allergic to skin adhesives [cyanoacrylate].  MEDICATIONS:  Current Outpatient Medications  Medication Sig Dispense Refill   [START ON 07/10/2024] anastrozole (ARIMIDEX) 1 MG tablet Take 1 tablet (1 mg total) by mouth daily. 90 tablet 3   ALPRAZolam  (XANAX ) 0.25 MG tablet Take 1 tablet (0.25 mg total) by mouth  2 (two) times daily as needed for anxiety. 30 tablet 0   amLODipine  (NORVASC ) 10 MG tablet TAKE 1 TABLET DAILY 90 tablet 1   cetirizine  (ZYRTEC ) 10 MG tablet Take 1 tablet (10 mg total) by mouth at bedtime. 30 tablet 11   metoprolol  tartrate (LOPRESSOR ) 25 MG tablet Take 1 tablet (25 mg total) by mouth 2 (two) times daily. 180 tablet 3   Multiple Vitamins-Minerals (MULTIVITAMIN ADULTS 50+ PO) Take by mouth daily.     triamterene -hydrochlorothiazide   (MAXZIDE -25) 37.5-25 MG tablet TAKE 1 TABLET DAILY 90 tablet 3   No current facility-administered medications for this visit.    All other systems were reviewed with the patient and are negative.  PHYSICAL EXAMINATION: ECOG PERFORMANCE STATUS: 0 - Asymptomatic  Vitals:   06/01/24 1341  BP: 128/72  Pulse: 78  Resp: 16  Temp: 98.7 F (37.1 C)  SpO2: 97%   Filed Weights   06/01/24 1341  Weight: 237 lb 4.8 oz (107.6 kg)    GENERAL:alert, no distress and comfortable  LABORATORY DATA:  I have reviewed the data as listed Lab Results  Component Value Date   WBC 3.9 09/17/2023   HGB 13.7 09/17/2023   HCT 43.9 09/17/2023   MCV 85.7 09/17/2023   PLT 277 09/17/2023   Lab Results  Component Value Date   NA 142 03/30/2024   K 3.8 03/30/2024   CL 103 03/30/2024   CO2 28 03/30/2024    RADIOGRAPHIC STUDIES: I have personally reviewed the radiological reports and agreed with the findings in the report.  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 Lumpectomy completed. Radiation therapy planned. Hormonal therapy post-radiation to prevent recurrence. Tamoxifen not suitable due to endometrial hyperplasia history. Aromatase inhibitors considered. Discussed about mechanism of action, adverse effects of each class of drugs in detail. - Initiate anastrozole mid-August post-radiation to reduce risk of recurrence - Discussed anastrozole side effects including but not limited to joint stiffness, hot flashes, vaginal dryness and bone density loss. - Monitor bone density with scans every two years. - Encourage walking and vitamin D  supplementation for bone density. - Arrange for [facility] to mail anastrozole prescription.       All questions were answered. The patient knows to call the clinic with any problems, questions or  concerns.    Amber Stalls, MD 06/01/24

## 2024-06-05 ENCOUNTER — Ambulatory Visit: Payer: Self-pay | Admitting: Genetic Counselor

## 2024-06-05 ENCOUNTER — Other Ambulatory Visit: Payer: Self-pay

## 2024-06-05 ENCOUNTER — Encounter: Payer: Self-pay | Admitting: Genetic Counselor

## 2024-06-05 ENCOUNTER — Ambulatory Visit
Admission: RE | Admit: 2024-06-05 | Discharge: 2024-06-05 | Disposition: A | Discharge status: Home / Self Care | Payer: Medicare (Managed Care) | Source: Ambulatory Visit | Attending: Radiation Oncology

## 2024-06-05 ENCOUNTER — Telehealth: Payer: Self-pay | Admitting: Genetic Counselor

## 2024-06-05 DIAGNOSIS — Z510 Encounter for antineoplastic radiation therapy: Secondary | ICD-10-CM | POA: Diagnosis not present

## 2024-06-05 DIAGNOSIS — Z1379 Encounter for other screening for genetic and chromosomal anomalies: Secondary | ICD-10-CM | POA: Insufficient documentation

## 2024-06-05 LAB — RAD ONC ARIA SESSION SUMMARY
Course Elapsed Days: 14
Plan Fractions Treated to Date: 9
Plan Prescribed Dose Per Fraction: 2.66 Gy
Plan Total Fractions Prescribed: 16
Plan Total Prescribed Dose: 42.56 Gy
Reference Point Dosage Given to Date: 23.94 Gy
Reference Point Session Dosage Given: 2.66 Gy
Session Number: 9

## 2024-06-05 NOTE — Telephone Encounter (Signed)
 I contacted Michelle Campos to discuss her genetic testing results. No pathogenic variants were identified in the 43 genes analyzed. 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 Detailed clinic note to follow.  The test report has been scanned into EPIC and is located under the Molecular Pathology section of the Results Review tab.  A portion of the result report is included below for reference.   Ellarae Nevitt, MS, [facility] Genetic Counselor [email or web address] (P) [phone]

## 2024-06-05 NOTE — Progress Notes (Signed)
 HPI:   Michelle Campos was previously seen in the [facility] due to a personal and family history of cancer and concerns regarding a hereditary predisposition to cancer. Please refer to our prior cancer genetics clinic note for more information regarding our discussion, assessment and recommendations, at the time. Michelle Campos recent genetic test results were disclosed to her, as were recommendations warranted by these results. These results and recommendations are discussed in more detail below.  CANCER HISTORY:  Oncology History  Ductal carcinoma in situ (DCIS) of right breast  02/29/2024 Mammogram    Indeterminate mass at the right breast 9 o'clock position may reflect a complicated cyst with fluid debris level versus complex solid and cystic mass. Recommend ultrasound-guided biopsy for definitive characterization. 2. No right axillary lymphadenopathy. 3. Interval resolution of the mass at the left breast 3 o'clock position. No mammographic findings of malignancy the left breast.   03/27/2024 Initial Diagnosis   Ductal carcinoma in situ (DCIS) of right breast   04/04/2024 Pathology Results   BREAST, RIGHT, LUMPECTOMY:  - Low-grade ductal carcinoma in situ, involving an intraductal papilloma  - No evidence of invasive carcinoma  - Resection margins are negative for DCIS   Estrogen Receptor: 99%, positive, strong staining intensity       Progesterone Receptor: 80%, positive, strong staining intensity    05/10/2024 Cancer Staging   Staging form: Breast, AJCC 8th Edition - Clinical: Stage 0 (cTis (DCIS), cN0, cM0, G1, ER+, PR+) - Signed by Loretha Ash, MD on 05/10/2024 Stage prefix: Initial diagnosis Histologic grading system: 3 grade system     FAMILY HISTORY:  We obtained a detailed, 4-generation family history.  Significant diagnoses are listed below:      Family History  Problem Relation Age of Onset   Multiple myeloma Brother 41   Lung cancer  Maternal Uncle          heavy smoker   Ovarian cancer Maternal Grandmother          possibly uterine cancer       Michelle Campos is unaware of previous family history of genetic testing for hereditary cancer risks. There is no reported Ashkenazi Jewish ancestry.   GENETIC TEST RESULTS:  The [facility] CustomNext Panel found no pathogenic mutations.  The [facility] CustomNext Panel included [facility] CancerNext+RNAinsight Panel which includes sequencing, rearrangement analysis, and RNA analysis for the following 40 genes: APC, ATM, BAP1, BARD1, BMPR1A, BRCA1, BRCA2, BRIP1, CDH1, CDKN2A, CHEK2, FH, FLCN, MET, MLH1, MSH2, MSH6, MUTYH, NF1, NTHL1, PALB2, PMS2, PTEN, RAD51C, RAD51D, RPS20, SMAD4, STK11, TP53, TSC1, TSC2, and VHL (sequencing and deletion/duplication); AXIN2, HOXB13, MBD4, MSH3, POLD1 and POLE (sequencing only); EPCAM and GREM1 (deletion/duplication only). We also added 3 additional thyroid  cancer genes, DICER1, PRKAR1A, and RET, for a total of 43 genes.  The test report has been scanned into EPIC and is located under the Molecular Pathology section of the Results Review tab.  A portion of the Campos report is included below for reference. Genetic testing reported out on 05/30/2024.       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  At this time, it is unknown if this variant is associated with an increased risk for cancer or if it is benign, but most uncertain variants are reclassified to benign. It should not be used to make medical management decisions. With time, we suspect the laboratory will determine the significance of this variant, if any. If the laboratory  reclassifies this variant, we will attempt to contact Michelle Campos to discuss it further.   Even though a pathogenic variant was not identified, possible explanations for the cancer in the family may include: There may be no hereditary risk for cancer in the family. The  cancers in Michelle Campos and/or her family may be due to other genetic or environmental factors. There may be a gene mutation in one of these genes that current testing methods cannot detect, but that chance is small. There could be another gene that has not yet been discovered, or that we have not yet tested, that is responsible for the cancer diagnoses in the family.  It is also possible there is a hereditary cause for the cancer in the family that Michelle Campos did not inherit.  Therefore, it is important to remain in touch with cancer genetics in the future so that we can continue to offer Michelle Campos the most up to date genetic testing.   ADDITIONAL GENETIC TESTING:  We discussed with Michelle Campos that her genetic testing was fairly extensive.  If there are genes identified to increase cancer risk that can be analyzed in the future, we would be happy to discuss and coordinate this testing at that time.    CANCER SCREENING RECOMMENDATIONS:  Michelle Campos is considered negative (normal).  This means that we have not identified a hereditary cause for her personal and family history of cancer at this time.   An individual's cancer risk and medical management are not determined by genetic test results alone. Overall cancer risk assessment incorporates additional factors, including personal medical history, family history, and any available genetic information that may Campos in a personalized plan for cancer prevention and surveillance. Therefore, it is recommended she continue to follow the cancer management and screening guidelines provided by her oncology and primary healthcare provider.  RECOMMENDATIONS FOR FAMILY MEMBERS:   Since she did not inherit a mutation in a cancer predisposition gene included on this panel, her son could not have inherited a mutation from her in one of these genes. Individuals in this family might be at some increased risk of developing  cancer, over the general population risk, due to the family history of cancer. We recommend women in this family have a yearly mammogram beginning at age 91, or 81 years younger than the earliest onset of cancer, an annual clinical breast exam, and perform monthly breast self-exams.  Other members of the family may still carry a pathogenic variant in one of these genes that Michelle Campos did not inherit. Due to her maternal grandmother's history of ovarian cancer, we recommend her siblings have genetic counseling and testing.  We do not recommend familial testing for the BMPR1A variant of uncertain significance (VUS).  FOLLOW-UP:  Cancer genetics is a rapidly advancing field and it is possible that new genetic tests will be appropriate for her and/or her family members in the future. We encouraged her to remain in contact with cancer genetics on an annual basis so we can update her personal and family histories and let her know of advances in cancer genetics that may benefit this family.   Our contact number was provided. Michelle Campos questions were answered to her satisfaction, and she knows she is welcome to call us  at anytime with additional questions or concerns.   Kwanza Cancelliere, MS, [facility] Genetic Counselor [email or web address] (P) [phone]

## 2024-06-06 ENCOUNTER — Ambulatory Visit
Admission: RE | Admit: 2024-06-06 | Discharge: 2024-06-06 | Disposition: A | Discharge status: Home / Self Care | Payer: Medicare (Managed Care) | Source: Ambulatory Visit | Attending: Radiation Oncology | Admitting: Radiation Oncology

## 2024-06-06 ENCOUNTER — Other Ambulatory Visit: Payer: Self-pay

## 2024-06-06 DIAGNOSIS — Z510 Encounter for antineoplastic radiation therapy: Secondary | ICD-10-CM | POA: Diagnosis not present

## 2024-06-06 LAB — RAD ONC ARIA SESSION SUMMARY
Course Elapsed Days: 15
Plan Fractions Treated to Date: 10
Plan Prescribed Dose Per Fraction: 2.66 Gy
Plan Total Fractions Prescribed: 16
Plan Total Prescribed Dose: 42.56 Gy
Reference Point Dosage Given to Date: 26.6 Gy
Reference Point Session Dosage Given: 2.66 Gy
Session Number: 10

## 2024-06-07 ENCOUNTER — Ambulatory Visit
Admission: RE | Admit: 2024-06-07 | Discharge: 2024-06-07 | Disposition: A | Discharge status: Home / Self Care | Payer: Medicare (Managed Care) | Source: Ambulatory Visit | Attending: Radiation Oncology | Admitting: Radiation Oncology

## 2024-06-07 ENCOUNTER — Other Ambulatory Visit: Payer: Self-pay

## 2024-06-07 DIAGNOSIS — Z510 Encounter for antineoplastic radiation therapy: Secondary | ICD-10-CM | POA: Diagnosis not present

## 2024-06-07 LAB — RAD ONC ARIA SESSION SUMMARY
Course Elapsed Days: 16
Plan Fractions Treated to Date: 11
Plan Prescribed Dose Per Fraction: 2.66 Gy
Plan Total Fractions Prescribed: 16
Plan Total Prescribed Dose: 42.56 Gy
Reference Point Dosage Given to Date: 29.26 Gy
Reference Point Session Dosage Given: 2.66 Gy
Session Number: 11

## 2024-06-08 ENCOUNTER — Ambulatory Visit
Admission: RE | Admit: 2024-06-08 | Discharge: 2024-06-08 | Disposition: A | Discharge status: Home / Self Care | Payer: Medicare (Managed Care) | Source: Ambulatory Visit | Attending: Radiation Oncology | Admitting: Radiation Oncology

## 2024-06-08 ENCOUNTER — Other Ambulatory Visit: Payer: Self-pay

## 2024-06-08 DIAGNOSIS — Z510 Encounter for antineoplastic radiation therapy: Secondary | ICD-10-CM | POA: Diagnosis not present

## 2024-06-08 LAB — RAD ONC ARIA SESSION SUMMARY
Course Elapsed Days: 17
Plan Fractions Treated to Date: 12
Plan Prescribed Dose Per Fraction: 2.66 Gy
Plan Total Fractions Prescribed: 16
Plan Total Prescribed Dose: 42.56 Gy
Reference Point Dosage Given to Date: 31.92 Gy
Reference Point Session Dosage Given: 2.66 Gy
Session Number: 12

## 2024-06-09 ENCOUNTER — Other Ambulatory Visit: Payer: Self-pay

## 2024-06-09 ENCOUNTER — Ambulatory Visit
Admission: RE | Admit: 2024-06-09 | Discharge: 2024-06-09 | Disposition: A | Discharge status: Home / Self Care | Payer: Medicare (Managed Care) | Source: Ambulatory Visit | Attending: Radiation Oncology | Admitting: Radiation Oncology

## 2024-06-09 ENCOUNTER — Ambulatory Visit: Payer: Medicare (Managed Care) | Admitting: Radiation Oncology

## 2024-06-09 DIAGNOSIS — Z510 Encounter for antineoplastic radiation therapy: Secondary | ICD-10-CM | POA: Diagnosis not present

## 2024-06-09 LAB — RAD ONC ARIA SESSION SUMMARY
Course Elapsed Days: 18
Plan Fractions Treated to Date: 13
Plan Prescribed Dose Per Fraction: 2.66 Gy
Plan Total Fractions Prescribed: 16
Plan Total Prescribed Dose: 42.56 Gy
Reference Point Dosage Given to Date: 34.58 Gy
Reference Point Session Dosage Given: 2.66 Gy
Session Number: 13

## 2024-06-12 ENCOUNTER — Ambulatory Visit
Admission: RE | Admit: 2024-06-12 | Discharge: 2024-06-12 | Disposition: A | Discharge status: Home / Self Care | Payer: Medicare (Managed Care) | Source: Ambulatory Visit | Attending: Radiation Oncology

## 2024-06-12 ENCOUNTER — Other Ambulatory Visit: Payer: Self-pay

## 2024-06-12 ENCOUNTER — Ambulatory Visit: Payer: Medicare (Managed Care)

## 2024-06-12 DIAGNOSIS — Z510 Encounter for antineoplastic radiation therapy: Secondary | ICD-10-CM | POA: Diagnosis not present

## 2024-06-12 LAB — RAD ONC ARIA SESSION SUMMARY
Course Elapsed Days: 21
Plan Fractions Treated to Date: 14
Plan Prescribed Dose Per Fraction: 2.66 Gy
Plan Total Fractions Prescribed: 16
Plan Total Prescribed Dose: 42.56 Gy
Reference Point Dosage Given to Date: 37.24 Gy
Reference Point Session Dosage Given: 2.66 Gy
Session Number: 14

## 2024-06-13 ENCOUNTER — Ambulatory Visit
Admission: RE | Admit: 2024-06-13 | Discharge: 2024-06-13 | Disposition: A | Discharge status: Home / Self Care | Payer: Medicare (Managed Care) | Source: Ambulatory Visit | Attending: Radiation Oncology

## 2024-06-13 ENCOUNTER — Other Ambulatory Visit: Payer: Self-pay

## 2024-06-13 ENCOUNTER — Ambulatory Visit: Payer: Medicare (Managed Care)

## 2024-06-13 DIAGNOSIS — Z510 Encounter for antineoplastic radiation therapy: Secondary | ICD-10-CM | POA: Diagnosis not present

## 2024-06-13 LAB — RAD ONC ARIA SESSION SUMMARY
Course Elapsed Days: 22
Plan Fractions Treated to Date: 15
Plan Prescribed Dose Per Fraction: 2.66 Gy
Plan Total Fractions Prescribed: 16
Plan Total Prescribed Dose: 42.56 Gy
Reference Point Dosage Given to Date: 39.9 Gy
Reference Point Session Dosage Given: 2.66 Gy
Session Number: 15

## 2024-06-14 ENCOUNTER — Other Ambulatory Visit: Payer: Self-pay

## 2024-06-14 ENCOUNTER — Ambulatory Visit: Payer: Medicare (Managed Care)

## 2024-06-14 ENCOUNTER — Ambulatory Visit
Admission: RE | Admit: 2024-06-14 | Discharge: 2024-06-14 | Disposition: A | Discharge status: Home / Self Care | Payer: Medicare (Managed Care) | Source: Ambulatory Visit | Attending: Radiation Oncology | Admitting: Radiation Oncology

## 2024-06-14 DIAGNOSIS — Z510 Encounter for antineoplastic radiation therapy: Secondary | ICD-10-CM | POA: Diagnosis not present

## 2024-06-14 LAB — RAD ONC ARIA SESSION SUMMARY
Course Elapsed Days: 23
Plan Fractions Treated to Date: 16
Plan Prescribed Dose Per Fraction: 2.66 Gy
Plan Total Fractions Prescribed: 16
Plan Total Prescribed Dose: 42.56 Gy
Reference Point Dosage Given to Date: 42.56 Gy
Reference Point Session Dosage Given: 2.66 Gy
Session Number: 16

## 2024-06-15 ENCOUNTER — Ambulatory Visit: Payer: Medicare (Managed Care)

## 2024-06-15 ENCOUNTER — Ambulatory Visit
Admission: RE | Admit: 2024-06-15 | Discharge: 2024-06-15 | Disposition: A | Discharge status: Home / Self Care | Payer: Medicare (Managed Care) | Source: Ambulatory Visit | Attending: Radiation Oncology | Admitting: Radiation Oncology

## 2024-06-15 ENCOUNTER — Other Ambulatory Visit: Payer: Self-pay

## 2024-06-15 DIAGNOSIS — D0511 Intraductal carcinoma in situ of right breast: Secondary | ICD-10-CM | POA: Diagnosis not present

## 2024-06-15 DIAGNOSIS — Z510 Encounter for antineoplastic radiation therapy: Secondary | ICD-10-CM | POA: Diagnosis not present

## 2024-06-15 LAB — RAD ONC ARIA SESSION SUMMARY
Course Elapsed Days: 24
Plan Fractions Treated to Date: 1
Plan Prescribed Dose Per Fraction: 2 Gy
Plan Total Fractions Prescribed: 4
Plan Total Prescribed Dose: 8 Gy
Reference Point Dosage Given to Date: 2 Gy
Reference Point Session Dosage Given: 2 Gy
Session Number: 17

## 2024-06-16 ENCOUNTER — Ambulatory Visit: Payer: Medicare (Managed Care)

## 2024-06-19 ENCOUNTER — Ambulatory Visit
Admission: RE | Admit: 2024-06-19 | Discharge: 2024-06-19 | Disposition: A | Discharge status: Home / Self Care | Payer: Medicare (Managed Care) | Source: Ambulatory Visit | Attending: Radiation Oncology

## 2024-06-19 ENCOUNTER — Other Ambulatory Visit: Payer: Self-pay

## 2024-06-19 ENCOUNTER — Ambulatory Visit: Payer: Medicare (Managed Care)

## 2024-06-19 DIAGNOSIS — Z510 Encounter for antineoplastic radiation therapy: Secondary | ICD-10-CM | POA: Diagnosis not present

## 2024-06-19 LAB — RAD ONC ARIA SESSION SUMMARY
Course Elapsed Days: 28
Plan Fractions Treated to Date: 2
Plan Prescribed Dose Per Fraction: 2 Gy
Plan Total Fractions Prescribed: 4
Plan Total Prescribed Dose: 8 Gy
Reference Point Dosage Given to Date: 4 Gy
Reference Point Session Dosage Given: 2 Gy
Session Number: 18

## 2024-06-20 ENCOUNTER — Ambulatory Visit
Admission: RE | Admit: 2024-06-20 | Discharge: 2024-06-20 | Disposition: A | Discharge status: Home / Self Care | Payer: Medicare (Managed Care) | Source: Ambulatory Visit | Attending: Radiation Oncology | Admitting: Radiation Oncology

## 2024-06-20 ENCOUNTER — Other Ambulatory Visit: Payer: Self-pay

## 2024-06-20 DIAGNOSIS — Z510 Encounter for antineoplastic radiation therapy: Secondary | ICD-10-CM | POA: Diagnosis not present

## 2024-06-20 LAB — RAD ONC ARIA SESSION SUMMARY
Course Elapsed Days: 29
Plan Fractions Treated to Date: 3
Plan Prescribed Dose Per Fraction: 2 Gy
Plan Total Fractions Prescribed: 4
Plan Total Prescribed Dose: 8 Gy
Reference Point Dosage Given to Date: 6 Gy
Reference Point Session Dosage Given: 2 Gy
Session Number: 19

## 2024-06-21 ENCOUNTER — Other Ambulatory Visit: Payer: Self-pay

## 2024-06-21 ENCOUNTER — Ambulatory Visit
Admission: RE | Admit: 2024-06-21 | Discharge: 2024-06-21 | Disposition: A | Discharge status: Home / Self Care | Payer: Medicare (Managed Care) | Source: Ambulatory Visit | Attending: Radiation Oncology

## 2024-06-21 ENCOUNTER — Ambulatory Visit
Admission: RE | Admit: 2024-06-21 | Discharge: 2024-06-21 | Disposition: A | Discharge status: Home / Self Care | Payer: Medicare (Managed Care) | Source: Ambulatory Visit | Attending: Radiation Oncology | Admitting: Radiation Oncology

## 2024-06-21 DIAGNOSIS — Z510 Encounter for antineoplastic radiation therapy: Secondary | ICD-10-CM | POA: Diagnosis not present

## 2024-06-21 LAB — RAD ONC ARIA SESSION SUMMARY
Course Elapsed Days: 30
Plan Fractions Treated to Date: 4
Plan Prescribed Dose Per Fraction: 2 Gy
Plan Total Fractions Prescribed: 4
Plan Total Prescribed Dose: 8 Gy
Reference Point Dosage Given to Date: 8 Gy
Reference Point Session Dosage Given: 2 Gy
Session Number: 20

## 2024-06-22 NOTE — Radiation Completion Notes (Addendum)
  Radiation Oncology         [phone] ________________________________  Name: Michelle Campos MRN: [id number]  Date of Service: 06/21/2024  DOB: 1956-09-15  End of Treatment Note    Diagnosis: D05.11 Intraductal carcinoma in situ of right breast Staging on 2024-05-10: Ductal carcinoma in situ (DCIS) of right breast T=cTis (DCIS), N=cN0, M=cM0 Intent: Curative     ==========DELIVERED PLANS==========  First Treatment Date: 2024-05-22 Last Treatment Date: 2024-06-21   Plan Name: Breast_R Site: Breast, Right Technique: 3D Mode: Photon Dose Per Fraction: 2.66 Gy Prescribed Dose (Delivered / Prescribed): 42.56 Gy / 42.56 Gy Prescribed Fxs (Delivered / Prescribed): 16 / 16   Plan Name: Breast_R_Bst Site: Breast, Right Technique: 3D Mode: Photon Dose Per Fraction: 2 Gy Prescribed Dose (Delivered / Prescribed): 8 Gy / 8 Gy Prescribed Fxs (Delivered / Prescribed): 4 / 4     ==========ON TREATMENT VISIT DATES========== 2024-05-29, 2024-06-06, 2024-06-09, 2024-06-21 The patient tolerated radiation. She developed fatigue and anticipated skin changes in the treatment field.   The patient will receive a call in about one month from the radiation oncology department. She will continue follow up with Dr. Loretha as well.      Donald KYM Husband, PAC

## 2024-07-04 ENCOUNTER — Telehealth: Payer: Self-pay

## 2024-07-04 NOTE — Telephone Encounter (Signed)
 Left message to confirm appt for 8/6

## 2024-07-05 ENCOUNTER — Inpatient Hospital Stay: Payer: Medicare (Managed Care) | Attending: Hematology and Oncology | Admitting: Hematology and Oncology

## 2024-07-05 VITALS — BP 113/80 | HR 72 | Temp 98.2°F | Resp 21 | Wt 242.7 lb

## 2024-07-05 DIAGNOSIS — D0511 Intraductal carcinoma in situ of right breast: Secondary | ICD-10-CM | POA: Diagnosis not present

## 2024-07-05 DIAGNOSIS — Z79811 Long term (current) use of aromatase inhibitors: Secondary | ICD-10-CM | POA: Diagnosis not present

## 2024-07-06 NOTE — Progress Notes (Signed)
 [facility] CONSULT NOTE  Patient Care Team: Ngetich, Roxan BROCKS, NP as PCP - General (Family Medicine) Glean Stephane BROCKS, RN (Inactive) as Oncology Nurse Navigator Tyree Nanetta SAILOR, RN as Oncology Nurse Navigator Loretha Ash, MD as Consulting Physician (Hematology and Oncology)  CHIEF COMPLAINTS/PURPOSE OF CONSULTATION:  Newly diagnosed breast cancer  HISTORY OF PRESENTING ILLNESS:  Michelle Campos 68 y.o. female is here because of recent diagnosis of right breast DCIS  I reviewed her records extensively and collaborated the history with the patient.  SUMMARY OF ONCOLOGIC HISTORY: Oncology History  Ductal carcinoma in situ (DCIS) of right breast  02/29/2024 Mammogram    Indeterminate mass at the right breast 9 o'clock position may reflect a complicated cyst with fluid debris level versus complex solid and cystic mass. Recommend ultrasound-guided biopsy for definitive characterization. 2. No right axillary lymphadenopathy. 3. Interval resolution of the mass at the left breast 3 o'clock position. No mammographic findings of malignancy the left breast.   03/27/2024 Initial Diagnosis   Ductal carcinoma in situ (DCIS) of right breast   04/04/2024 Pathology Results   BREAST, RIGHT, LUMPECTOMY:  - Low-grade ductal carcinoma in situ, involving an intraductal papilloma  - No evidence of invasive carcinoma  - Resection margins are negative for DCIS   Estrogen Receptor: 99%, positive, strong staining intensity       Progesterone Receptor: 80%, positive, strong staining intensity    05/10/2024 Cancer Staging   Staging form: Breast, AJCC 8th Edition - Clinical: Stage 0 (cTis (DCIS), cN0, cM0, G1, ER+, PR+) - Signed by Loretha Ash, MD on 05/10/2024 Stage prefix: Initial diagnosis Histologic grading system: 3 grade system    Genetic Testing   [facility] CustomNext Panel+RNA was Negative. 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 Report date is 05/30/2024.  The [facility] CustomNext Panel included [facility] CancerNext+RNAinsight Panel+ 3 additional thyroid  cancer genes (DICER1, PRKAR1A, and RET) for a total of 43 genes. CancerNext includes sequencing, rearrangement analysis, and RNA analysis for the following 40 genes: APC, ATM, BAP1, BARD1, BMPR1A, BRCA1, BRCA2, BRIP1, CDH1, CDKN2A, CHEK2, FH, FLCN, MET, MLH1, MSH2, MSH6, MUTYH, NF1, NTHL1, PALB2, PMS2, PTEN, RAD51C, RAD51D, RPS20, SMAD4, STK11, TP53, TSC1, TSC2, and VHL (sequencing and deletion/duplication); AXIN2, HOXB13, MBD4, MSH3, POLD1 and POLE (sequencing only); EPCAM and GREM1 (deletion/duplication only).      Discussed the use of AI scribe software for clinical note transcription with the patient, who gave verbal consent to proceed.  History of Present Illness   Michelle Campos is a 68 year old female who presents for follow-up after completing radiation therapy.  She has completed her radiation therapy two weeks ago and is preparing to start anastrozole . She has the prescription at home and is ready to start the medication as per the instructions on the bottle, which indicate not to start before August 11th.  Initially, she experienced burning sensations under her breasts and arms, which have resolved with peeling and new skin formation. Her skin color is returning to normal.  She is concerned about the potential recurrence of severe hot flashes and joint stiffness, particularly in the mornings, upon starting anastrozole . She recalls experiencing severe hot flashes during menopause.  She has not had a recent bone density scan and is aware of the importance of monitoring bone density, especially post-menopause. Her current medication regimen includes a multivitamin for individuals over fifty, which contains calcium  and vitamin D . She has reduced her dairy intake to manage cholesterol levels  and relies on the multivitamin for her calcium  and vitamin D   needs.   MEDICAL HISTORY:  Past Medical History:  Diagnosis Date   Arthritis    right knee (05/27/2015)   Breast cancer (HCC) 2025   Family history of adverse reaction to anesthesia    it's hard to bring my father back; more than once too (05/27/2015)   GERD (gastroesophageal reflux disease)    Hypercalcemia    parathyroid  gland removed   Hypertension    controlled with medication   Obesity (BMI 30-39.9)    Parathyroid  cyst (HCC)    Superficial fungus infection of skin    on chest   Thyroid  cancer (HCC) 2016   Thyroid  mass    2    SURGICAL HISTORY: Past Surgical History:  Procedure Laterality Date   BREAST BIOPSY Right 03/03/2024   US  RT BREAST BX W LOC DEV 1ST LESION IMG BX SPEC US  GUIDE 03/03/2024 [facility] MAMMOGRAPHY   BREAST BIOPSY  03/30/2024   MM RT RADIOACTIVE SEED LOC MAMMO GUIDE 03/30/2024 [facility] MAMMOGR[facility]   BREAST LUMPECTOMY WITH RADIOACTIVE SEED LOCALIZATION Right 04/04/2024   Procedure: RIGHT BREAST LUMPECTOMY WITH RADIOACTIVE SEED LOCALIZATION;  Surgeon: Aron Shoulders, MD;  Location: [facility];  Service: General;  Laterality: Right;  RIGHT BREAST SEED LOCALIZED LUMPECTOMY   CESAREAN SECTION  12/01/1995   IRRIGATION AND DEBRIDEMENT SEBACEOUS CYST  ~ 2010   on back   PARATHYROIDECTOMY Right 05/27/2015   PARATHYROIDECTOMY Right 05/27/2015   Procedure: RIGHT PARATHYROIDECTOMY WITH FROZEN SECTION;  Surgeon: Ida Loader, MD;  Location: [facility] OR;  Service: ENT;  Laterality: Right;   THYROID  LOBECTOMY Right 05/27/2015   THYROIDECTOMY Right 05/27/2015   Procedure: RIGHT THYROID  LOBECTOMY WITH FROZEN SECTION;  Surgeon: Ida Loader, MD;  Location: [facility] OR;  Service: ENT;  Laterality: Right;   TUBAL LIGATION  12/01/1995    SOCIAL HISTORY: Social History   Socioeconomic History   Marital status: Divorced    Spouse name: Not on file   Number of children: Not on file   Years of education: Not on file   Highest education level: Bachelor's degree (e.g., BA,  AB, BS)  Occupational History   Not on file  Tobacco Use   Smoking status: Former    Current packs/day: 0.25    Average packs/day: 0.3 packs/day for 4.0 years (1.0 ttl pk-yrs)    Types: Cigarettes   Smokeless tobacco: Never   Tobacco comments:    quit smoking cigarettes in the 1970's  Vaping Use   Vaping status: Never Used  Substance and Sexual Activity   Alcohol use: Yes    Comment: maybe once or twice a year   Drug use: No   Sexual activity: Yes  Other Topics Concern   Not on file  Social History Narrative   Social History      Diet?       Do you drink/eat things with caffeine? Yes, soft drinks, coffee (occasionally)      Marital status?           divorced                         What year were you married? 1992      Do you live in a house, apartment, assisted living, condo, trailer, etc.? house      Is it one or more stories? 2 stories      How many persons live in your home? 2  Do you have any pets in your home? (please list) no      Highest level of education completed? Some graduate studies      Current or past profession: Environmental health practitioner      Do you exercise?        yes                              Type & how often? 3 x week- bike, treadmill      Advanced Directives      Do you have a living will? no      Do you have a DNR form?    no                              If not, do you want to discuss one? no      Do you have signed POA/HPOA for forms? no      Functional Status      Do you have difficulty bathing or dressing yourself? no      Do you have difficulty preparing food or eating? no      Do you have difficulty managing your medications? no      Do you have difficulty managing your finances?  no      Do you have difficulty affording your medications?   Social Drivers of Corporate investment banker Strain: Low Risk  (04/09/2024)   Overall Financial Resource Strain (CARDIA)    Difficulty of Paying Living Expenses: Not very hard   Food Insecurity: No Food Insecurity (05/10/2024)   Hunger Vital Sign    Worried About Running Out of Food in the Last Year: Never true    Ran Out of Food in the Last Year: Never true  Transportation Needs: No Transportation Needs (05/10/2024)   PRAPARE - Administrator, Civil Service (Medical): No    Lack of Transportation (Non-Medical): No  Physical Activity: Sufficiently Active (04/09/2024)   Exercise Vital Sign    Days of Exercise per Week: 4 days    Minutes of Exercise per Session: 60 min  Stress: Stress Concern Present (04/09/2024)   [employer] of Occupational Health - Occupational Stress Questionnaire    Feeling of Stress : Rather much  Social Connections: Moderately Integrated (04/09/2024)   Social Connection and Isolation Panel    Frequency of Communication with Friends and Family: More than three times a week    Frequency of Social Gatherings with Friends and Family: More than three times a week    Attends Religious Services: 1 to 4 times per year    Active Member of [employer] or Organizations: Yes    Attends Banker Meetings: More than 4 times per year    Marital Status: Divorced  Intimate Partner Violence: Unknown (05/10/2024)   Humiliation, Afraid, Rape, and Kick questionnaire    Fear of Current or Ex-Partner: No    Emotionally Abused: No    Physically Abused: No    Sexually Abused: Not on file    FAMILY HISTORY: Family History  Problem Relation Age of Onset   Congestive Heart Failure Mother    Diabetes Mother 64   Hypertension Mother    Congestive Heart Failure Father    Diabetes Sister 53   Hypertension Sister    Multiple myeloma Brother 42   Hypertension Brother    Hypertension Brother  Hyperlipidemia Brother    Hypertension Brother    Hyperlipidemia Brother    Lung cancer Maternal Uncle        heavy smoker   Ovarian cancer Maternal Grandmother        possibly uterine cancer    ALLERGIES:  is allergic to skin adhesives  [cyanoacrylate].  MEDICATIONS:  Current Outpatient Medications  Medication Sig Dispense Refill   ALPRAZolam  (XANAX ) 0.25 MG tablet Take 1 tablet (0.25 mg total) by mouth 2 (two) times daily as needed for anxiety. 30 tablet 0   amLODipine  (NORVASC ) 10 MG tablet TAKE 1 TABLET DAILY 90 tablet 1   [START ON 07/10/2024] anastrozole  (ARIMIDEX ) 1 MG tablet Take 1 tablet (1 mg total) by mouth daily. 90 tablet 3   cetirizine  (ZYRTEC ) 10 MG tablet Take 1 tablet (10 mg total) by mouth at bedtime. 30 tablet 11   metoprolol  tartrate (LOPRESSOR ) 25 MG tablet Take 1 tablet (25 mg total) by mouth 2 (two) times daily. 180 tablet 3   Multiple Vitamins-Minerals (MULTIVITAMIN ADULTS 50+ PO) Take by mouth daily.     triamterene -hydrochlorothiazide  (MAXZIDE -25) 37.5-25 MG tablet TAKE 1 TABLET DAILY 90 tablet 3   No current facility-administered medications for this visit.    All other systems were reviewed with the patient and are negative.  PHYSICAL EXAMINATION: ECOG PERFORMANCE STATUS: 0 - Asymptomatic  Vitals:   07/05/24 1335  BP: 113/80  Pulse: 72  Resp: (!) 21  Temp: 98.2 F (36.8 C)  SpO2: 98%   Filed Weights   07/05/24 1335  Weight: 242 lb 11.2 oz (110.1 kg)    GENERAL:alert, no distress and comfortable  LABORATORY DATA:  I have reviewed the data as listed Lab Results  Component Value Date   WBC 3.9 09/17/2023   HGB 13.7 09/17/2023   HCT 43.9 09/17/2023   MCV 85.7 09/17/2023   PLT 277 09/17/2023   Lab Results  Component Value Date   NA 142 03/30/2024   K 3.8 03/30/2024   CL 103 03/30/2024   CO2 28 03/30/2024    RADIOGRAPHIC STUDIES: I have personally reviewed the radiological reports and agreed with the findings in the report.  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 Lumpectomy completed. Radiation therapy planned.  Hormonal therapy post-radiation to prevent recurrence. Tamoxifen not suitable due to endometrial hyperplasia history. Aromatase inhibitors considered. Discussed about mechanism of action, adverse effects of each class of drugs in detail. - Initiate anastrozole  mid-August post-radiation to reduce risk of recurrence - Discussed anastrozole  side effects including but not limited to joint stiffness, hot flashes, vaginal dryness and bone density loss. - Monitor bone density with scans every two years. - Encourage walking and vitamin D  supplementation for bone density. - Arrange for [facility] to mail anastrozole  prescription.       All questions were answered. The patient knows to call the clinic with any problems, questions or concerns.    Amber Stalls, MD 07/06/24

## 2024-07-06 NOTE — Assessment & Plan Note (Signed)
 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 Lumpectomy completed. Radiation therapy planned. Hormonal therapy post-radiation to prevent recurrence. Tamoxifen not suitable due to endometrial hyperplasia history. Aromatase inhibitors considered. Discussed about mechanism of action, adverse effects of each class of drugs in detail. - Initiate anastrozole mid-August post-radiation to reduce risk of recurrence - Discussed anastrozole side effects including but not limited to joint stiffness, hot flashes, vaginal dryness and bone density loss. - Monitor bone density with scans every two years. - Encourage walking and vitamin D  supplementation for bone density. - Arrange for [facility] to mail anastrozole prescription.

## 2024-07-10 ENCOUNTER — Inpatient Hospital Stay: Payer: Medicare (Managed Care) | Admitting: General Practice

## 2024-07-10 NOTE — Progress Notes (Signed)
 [facility] Spiritual Care Note  Ms Novosad met with chaplain in [facility] to review Health Care Power of Attorney and Living Will forms and her associated questions. She plans to speak with her desired health care agents about her wishes (and possibly to consult her PCP) prior to presenting to [facility] on 08/07/2024 to complete Advance Directives.   [address] [place], [place], [facility] Pager [phone] Voicemail [phone]

## 2024-07-21 ENCOUNTER — Other Ambulatory Visit: Payer: Medicare (Managed Care) | Admitting: Licensed Clinical Social Worker

## 2024-07-24 ENCOUNTER — Ambulatory Visit
Admission: RE | Admit: 2024-07-24 | Discharge: 2024-07-24 | Disposition: A | Discharge status: Home / Self Care | Payer: Medicare (Managed Care) | Source: Ambulatory Visit | Attending: Adult Health | Admitting: Adult Health

## 2024-07-24 DIAGNOSIS — D0511 Intraductal carcinoma in situ of right breast: Secondary | ICD-10-CM | POA: Insufficient documentation

## 2024-07-25 NOTE — Progress Notes (Addendum)
  Radiation Oncology         [phone] ________________________________  Name: Michelle Campos MRN: [id number]  Date of Service: 07/24/2024  DOB: [birth year]  Post Treatment Telephone Note  Diagnosis:  Intraductal carcinoma in situ of right breast Staging on 2024-05-10: Ductal carcinoma in situ (DCIS) of right breast T=cTis (DCIS), N=cN0, M=cM0     The patient was available for call today.   Symptoms of fatigue have not improved since completing therapy.  Symptoms of skin changes have improved since completing therapy.  The patient was encouraged to avoid sun exposure in the area of prior treatment for up to one year following radiation with either sunscreen or by the style of clothing worn in the sun.  The patient has scheduled follow up with her medical oncologist Dr. Loretha for ongoing surveillance, and was encouraged to call if she develops concerns or questions regarding radiation.    Spoke with patient on 07/25/24 @ 9:10am

## 2024-08-07 ENCOUNTER — Other Ambulatory Visit: Payer: Medicare (Managed Care) | Admitting: Licensed Clinical Social Worker

## 2024-08-07 ENCOUNTER — Inpatient Hospital Stay: Payer: Medicare (Managed Care) | Attending: Hematology and Oncology | Admitting: Licensed Clinical Social Worker

## 2024-08-07 DIAGNOSIS — D0511 Intraductal carcinoma in situ of right breast: Secondary | ICD-10-CM

## 2024-08-07 NOTE — Progress Notes (Signed)
 [facility] Healthcare Advance Directives Clinical Social Work  Patient presented to [facility]  to review and complete healthcare advance directives.  Clinical Social Worker met with patient.  The patient designated Michelle Campos [phone]) as their primary healthcare agent and Michelle Campos, Michelle Campos [phone]) as their secondary agent.  Patient also completed healthcare living will.    Documents were notarized and copies made for patient/family. Clinical Social Worker will send documents to medical records to be scanned into patient's chart. Clinical Social Worker encouraged patient/family to contact with any additional questions or concerns.   [name], LCSW Clinical Social Worker [facility]

## 2024-08-14 ENCOUNTER — Other Ambulatory Visit: Payer: Self-pay | Admitting: Adult Health

## 2024-08-15 ENCOUNTER — Telehealth: Payer: Self-pay

## 2024-08-15 NOTE — Telephone Encounter (Addendum)
 Called patient to relay message below as per Morna Kendall NP, no answer to phone call left message on voice mail of details for the patient to call and schedule her bone density test.    ----- Message from Morna JAYSON Kendall sent at 08/14/2024  3:25 PM EDT ----- Patient bone density testing was changed from [facility] to [facility] since [facility] is no longer doing bone density testing.  She needs to go ahead and call and get this scheduled.  Their phone number is [phone]. Will you call her and let her know this please?  Thanks, LC

## 2024-08-31 ENCOUNTER — Encounter: Payer: Medicare HMO | Admitting: Orthopedic Surgery

## 2024-09-06 ENCOUNTER — Ambulatory Visit (INDEPENDENT_AMBULATORY_CARE_PROVIDER_SITE_OTHER): Payer: Medicare (Managed Care) | Admitting: Family

## 2024-09-06 ENCOUNTER — Encounter: Payer: Self-pay | Admitting: Family

## 2024-09-06 VITALS — BP 136/88 | HR 76 | Temp 97.8°F | Resp 20 | Ht 68.0 in | Wt 241.0 lb

## 2024-09-06 DIAGNOSIS — Z23 Encounter for immunization: Secondary | ICD-10-CM

## 2024-09-06 DIAGNOSIS — Z0000 Encounter for general adult medical examination without abnormal findings: Secondary | ICD-10-CM | POA: Diagnosis not present

## 2024-09-06 DIAGNOSIS — E2839 Other primary ovarian failure: Secondary | ICD-10-CM

## 2024-09-06 NOTE — Patient Instructions (Signed)
 Michelle Campos , Thank you for taking time to come for your Medicare Wellness Visit. I appreciate your ongoing commitment to your health goals. Please review the following plan we discussed and let me know if I can assist you in the future.   Screening recommendations/referrals: Colonoscopy : Update  Mammogram : Update  Bone Density : Ordered Recommended yearly ophthalmology/optometry visit for glaucoma screening and checkup Recommended yearly dental visit for hygiene and checkup  Vaccinations: Influenza vaccine- due annually in September/October Pneumococcal vaccine : Update  Tdap vaccine : Please get vaccine at the Pharmacy  Shingles vaccine :   : Update   Advanced directives: Yes   Conditions/risks identified: advanced age (>66men, >26 women);hypertension;obesity (BMI >30kg/m2);smoking/ tobacco exposure  Next appointment: 1 year    Preventive Care 70 Years and Older, Female Preventive care refers to lifestyle choices and visits with your health care provider that can promote health and wellness. What does preventive care include? A yearly physical exam. This is also called an annual well check. Dental exams once or twice a year. Routine eye exams. Ask your health care provider how often you should have your eyes checked. Personal lifestyle choices, including: Daily care of your teeth and gums. Regular physical activity. Eating a healthy diet. Avoiding tobacco and drug use. Limiting alcohol use. Practicing safe sex. Taking low-dose aspirin every day. Taking vitamin and mineral supplements as recommended by your health care provider. What happens during an annual well check? The services and screenings done by your health care provider during your annual well check will depend on your age, overall health, lifestyle risk factors, and family history of disease. Counseling  Your health care provider may ask you questions about your: Alcohol use. Tobacco use. Drug  use. Emotional well-being. Home and relationship well-being. Sexual activity. Eating habits. History of falls. Memory and ability to understand (cognition). Work and work Astronomer. Reproductive health. Screening  You may have the following tests or measurements: Height, weight, and BMI. Blood pressure. Lipid and cholesterol levels. These may be checked every 5 years, or more frequently if you are over 28 years old. Skin check. Lung cancer screening. You may have this screening every year starting at age 7 if you have a 30-pack-year history of smoking and currently smoke or have quit within the past 15 years. Fecal occult blood test (FOBT) of the stool. You may have this test every year starting at age 47. Flexible sigmoidoscopy or colonoscopy. You may have a sigmoidoscopy every 5 years or a colonoscopy every 10 years starting at age 19. Hepatitis C blood test. Hepatitis B blood test. Sexually transmitted disease (STD) testing. Diabetes screening. This is done by checking your blood sugar (glucose) after you have not eaten for a while (fasting). You may have this done every 1-3 years. Bone density scan. This is done to screen for osteoporosis. You may have this done starting at age 59. Mammogram. This may be done every 1-2 years. Talk to your health care provider about how often you should have regular mammograms. Talk with your health care provider about your test results, treatment options, and if necessary, the need for more tests. Vaccines  Your health care provider may recommend certain vaccines, such as: Influenza vaccine. This is recommended every year. Tetanus, diphtheria, and acellular pertussis (Tdap, Td) vaccine. You may need a Td booster every 10 years. Zoster vaccine. You may need this after age 82. Pneumococcal 13-valent conjugate (PCV13) vaccine. One dose is recommended after age 51. Pneumococcal polysaccharide (PPSV23)  vaccine. One dose is recommended after age  12. Talk to your health care provider about which screenings and vaccines you need and how often you need them. This information is not intended to replace advice given to you by your health care provider. Make sure you discuss any questions you have with your health care provider. Document Released: 12/13/2015 Document Revised: 08/05/2016 Document Reviewed: 09/17/2015 Elsevier Interactive Patient Education  2017 [employer].  Fall Prevention in the Home Falls can cause injuries. They can happen to people of all ages. There are many things you can do to make your home safe and to help prevent falls. What can I do on the outside of my home? Regularly fix the edges of walkways and driveways and fix any cracks. Remove anything that might make you trip as you walk through a door, such as a raised step or threshold. Trim any bushes or trees on the path to your home. Use bright outdoor lighting. Clear any walking paths of anything that might make someone trip, such as rocks or tools. Regularly check to see if handrails are loose or broken. Make sure that both sides of any steps have handrails. Any raised decks and porches should have guardrails on the edges. Have any leaves, snow, or ice cleared regularly. Use sand or salt on walking paths during winter. Clean up any spills in your garage right away. This includes oil or grease spills. What can I do in the bathroom? Use night lights. Install grab bars by the toilet and in the tub and shower. Do not use towel bars as grab bars. Use non-skid mats or decals in the tub or shower. If you need to sit down in the shower, use a plastic, non-slip stool. Keep the floor dry. Clean up any water that spills on the floor as soon as it happens. Remove soap buildup in the tub or shower regularly. Attach bath mats securely with double-sided non-slip rug tape. Do not have throw rugs and other things on the floor that can make you trip. What can I do in the  bedroom? Use night lights. Make sure that you have a light by your bed that is easy to reach. Do not use any sheets or blankets that are too big for your bed. They should not hang down onto the floor. Have a firm chair that has side arms. You can use this for support while you get dressed. Do not have throw rugs and other things on the floor that can make you trip. What can I do in the kitchen? Clean up any spills right away. Avoid walking on wet floors. Keep items that you use a lot in easy-to-reach places. If you need to reach something above you, use a strong step stool that has a grab bar. Keep electrical cords out of the way. Do not use floor polish or wax that makes floors slippery. If you must use wax, use non-skid floor wax. Do not have throw rugs and other things on the floor that can make you trip. What can I do with my stairs? Do not leave any items on the stairs. Make sure that there are handrails on both sides of the stairs and use them. Fix handrails that are broken or loose. Make sure that handrails are as long as the stairways. Check any carpeting to make sure that it is firmly attached to the stairs. Fix any carpet that is loose or worn. Avoid having throw rugs at the top or bottom  of the stairs. If you do have throw rugs, attach them to the floor with carpet tape. Make sure that you have a light switch at the top of the stairs and the bottom of the stairs. If you do not have them, ask someone to add them for you. What else can I do to help prevent falls? Wear shoes that: Do not have high heels. Have rubber bottoms. Are comfortable and fit you well. Are closed at the toe. Do not wear sandals. If you use a stepladder: Make sure that it is fully opened. Do not climb a closed stepladder. Make sure that both sides of the stepladder are locked into place. Ask someone to hold it for you, if possible. Clearly mark and make sure that you can see: Any grab bars or  handrails. First and last steps. Where the edge of each step is. Use tools that help you move around (mobility aids) if they are needed. These include: Canes. Walkers. Scooters. Crutches. Turn on the lights when you go into a dark area. Replace any light bulbs as soon as they burn out. Set up your furniture so you have a clear path. Avoid moving your furniture around. If any of your floors are uneven, fix them. If there are any pets around you, be aware of where they are. Review your medicines with your doctor. Some medicines can make you feel dizzy. This can increase your chance of falling. Ask your doctor what other things that you can do to help prevent falls. This information is not intended to replace advice given to you by your health care provider. Make sure you discuss any questions you have with your health care provider. Document Released: 09/12/2009 Document Revised: 04/23/2016 Document Reviewed: 12/21/2014 Elsevier Interactive Patient Education  2017 [employer].

## 2024-09-06 NOTE — Progress Notes (Signed)
 Subjective:   Michelle Campos is a 68 y.o. female who presents for Medicare Annual (Subsequent) preventive examination.  Visit Complete: In person  Patient Medicare AWV questionnaire was completed by the patient on 09/06/2024; I have confirmed that all information answered by patient is correct and no changes since this date.  Cardiac Risk Factors include: advanced age (>23men, >90 women);hypertension;obesity (BMI >30kg/m2);smoking/ tobacco exposure     Objective:    Today's Vitals   09/06/24 0915  BP: 136/88  Pulse: 76  Resp: 20  Temp: 97.8 F (36.6 C)  SpO2: 98%  Weight: 241 lb (109.3 kg)  Height: 5' 8 (1.727 m)   Body mass index is 36.64 kg/m.     09/06/2024    9:04 AM 07/05/2024    1:36 PM 05/09/2024    4:50 PM 04/04/2024    7:41 AM 03/29/2024   12:59 PM 03/28/2024    2:41 PM 03/21/2024    2:39 PM  Advanced Directives  Does Patient Have a Medical Advance Directive? Yes No No No No No No  Type of Estate agent of [place];Living will        Does patient want to make changes to medical advance directive? No - Patient declined        Copy of Healthcare Power of Attorney in Chart? Yes - validated most recent copy scanned in chart (See row information)        Would patient like information on creating a medical advance directive?  Yes (Inpatient - patient requests chaplain consult to create a medical advance directive) Yes (ED - Information included in AVS) No - Patient declined No - Patient declined No - Patient declined No - Patient declined    Current Medications (verified) Outpatient Encounter Medications as of 09/06/2024  Medication Sig   ALPRAZolam  (XANAX ) 0.25 MG tablet Take 1 tablet (0.25 mg total) by mouth 2 (two) times daily as needed for anxiety.   amLODipine  (NORVASC ) 10 MG tablet TAKE 1 TABLET DAILY   anastrozole  (ARIMIDEX ) 1 MG tablet Take 1 tablet (1 mg total) by mouth daily.   cetirizine  (ZYRTEC ) 10 MG tablet Take 1 tablet (10 mg  total) by mouth at bedtime. (Patient taking differently: Take 10 mg by mouth as needed.)   metoprolol  tartrate (LOPRESSOR ) 25 MG tablet Take 1 tablet (25 mg total) by mouth 2 (two) times daily.   Multiple Vitamins-Minerals (MULTIVITAMIN ADULTS 50+ PO) Take by mouth daily.   triamterene -hydrochlorothiazide  (MAXZIDE -25) 37.5-25 MG tablet TAKE 1 TABLET DAILY   No facility-administered encounter medications on file as of 09/06/2024.    Allergies (verified) Skin adhesives [cyanoacrylate]   History: Past Medical History:  Diagnosis Date   Arthritis    right knee (05/27/2015)   Breast cancer (HCC) 2025   Family history of adverse reaction to anesthesia    it's hard to bring my father back; more than once too (05/27/2015)   GERD (gastroesophageal reflux disease)    Hypercalcemia    parathyroid  gland removed   Hypertension    controlled with medication   Obesity (BMI 30-39.9)    Parathyroid  cyst    Superficial fungus infection of skin    on chest   Thyroid  cancer (HCC) 2016   Thyroid  mass    2   Past Surgical History:  Procedure Laterality Date   BREAST BIOPSY Right 03/03/2024   US  RT BREAST BX W LOC DEV 1ST LESION IMG BX SPEC US  GUIDE 03/03/2024 [facility]   BREAST BIOPSY  03/30/2024  MM RT RADIOACTIVE SEED LOC MAMMO GUIDE 03/30/2024 [facility]   BREAST LUMPECTOMY WITH RADIOACTIVE SEED LOCALIZATION Right 04/04/2024   Procedure: RIGHT BREAST LUMPECTOMY WITH RADIOACTIVE SEED LOCALIZATION;  Surgeon: Aron Shoulders, MD;  Location: [facility];  Service: General;  Laterality: Right;  RIGHT BREAST SEED LOCALIZED LUMPECTOMY   CESAREAN SECTION  12/01/1995   IRRIGATION AND DEBRIDEMENT SEBACEOUS CYST  ~ 2010   on back   PARATHYROIDECTOMY Right 05/27/2015   PARATHYROIDECTOMY Right 05/27/2015   Procedure: RIGHT PARATHYROIDECTOMY WITH FROZEN SECTION;  Surgeon: Ida Loader, MD;  Location: [facility];  Service: ENT;  Laterality: Right;   THYROID  LOBECTOMY Right 05/27/2015    THYROIDECTOMY Right 05/27/2015   Procedure: RIGHT THYROID  LOBECTOMY WITH FROZEN SECTION;  Surgeon: Ida Loader, MD;  Location: [facility] OR;  Service: ENT;  Laterality: Right;   TUBAL LIGATION  12/01/1995   Family History  Problem Relation Age of Onset   Congestive Heart Failure Mother    Diabetes Mother 51   Hypertension Mother    Congestive Heart Failure Father    Diabetes Sister 25   Hypertension Sister    Multiple myeloma Brother 69   Hypertension Brother    Hypertension Brother    Hyperlipidemia Brother    Hypertension Brother    Hyperlipidemia Brother    Lung cancer Maternal Uncle        heavy smoker   Ovarian cancer Maternal Grandmother        possibly uterine cancer   Social History   Socioeconomic History   Marital status: Divorced    Spouse name: Not on file   Number of children: Not on file   Years of education: Not on file   Highest education level: Bachelor's degree (e.g., BA, AB, BS)  Occupational History   Not on file  Tobacco Use   Smoking status: Former    Current packs/day: 0.25    Average packs/day: 0.3 packs/day for 4.0 years (1.0 ttl pk-yrs)    Types: Cigarettes   Smokeless tobacco: Never   Tobacco comments:    quit smoking cigarettes in the 1970's  Vaping Use   Vaping status: Never Used  Substance and Sexual Activity   Alcohol use: Yes    Comment: maybe once or twice a year   Drug use: No   Sexual activity: Yes  Other Topics Concern   Not on file  Social History Narrative   Social History      Diet?       Do you drink/eat things with caffeine? Yes, soft drinks, coffee (occasionally)      Marital status?           divorced                         What year were you married? 1992      Do you live in a house, apartment, assisted living, condo, trailer, etc.? house      Is it one or more stories? 2 stories      How many persons live in your home? 2      Do you have any pets in your home? (please list) no      Highest level of education  completed? Some graduate studies      Current or past profession: Environmental health practitioner      Do you exercise?        yes  Type & how often? 3 x week- bike, treadmill      Advanced Directives      Do you have a living will? no      Do you have a DNR form?    no                              If not, do you want to discuss one? no      Do you have signed POA/HPOA for forms? no      Functional Status      Do you have difficulty bathing or dressing yourself? no      Do you have difficulty preparing food or eating? no      Do you have difficulty managing your medications? no      Do you have difficulty managing your finances?  no      Do you have difficulty affording your medications?   Social Drivers of Corporate investment banker Strain: Low Risk  (04/09/2024)   Overall Financial Resource Strain (CARDIA)    Difficulty of Paying Living Expenses: Not very hard  Food Insecurity: No Food Insecurity (09/06/2024)   Hunger Vital Sign    Worried About Running Out of Food in the Last Year: Never true    Ran Out of Food in the Last Year: Never true  Transportation Needs: No Transportation Needs (09/06/2024)   PRAPARE - Administrator, Civil Service (Medical): No    Lack of Transportation (Non-Medical): No  Physical Activity: Sufficiently Active (04/09/2024)   Exercise Vital Sign    Days of Exercise per Week: 4 days    Minutes of Exercise per Session: 60 min  Stress: Stress Concern Present (04/09/2024)   [employer] of Occupational Health - Occupational Stress Questionnaire    Feeling of Stress : Rather much  Social Connections: Moderately Integrated (04/09/2024)   Social Connection and Isolation Panel    Frequency of Communication with Friends and Family: More than three times a week    Frequency of Social Gatherings with Friends and Family: More than three times a week    Attends Religious Services: 1 to 4 times per year    Active Member  of [employer] or Organizations: Yes    Attends Engineer, structural: More than 4 times per year    Marital Status: Divorced    Tobacco Counseling Counseling given: Not Answered Tobacco comments: quit smoking cigarettes in the 1970's   Clinical Intake:  Pre-visit preparation completed: No  Pain : No/denies pain     BMI - recorded: 36.64 Nutritional Status: BMI > 30  Obese Nutritional Risks: None Diabetes: No  How often do you need to have someone help you when you read instructions, pamphlets, or other written materials from your doctor or pharmacy?: 1 - Never What is the last grade level you completed in school?: College  Interpreter Needed?: No      Activities of Daily Living    09/06/2024    9:28 AM 04/04/2024    7:47 AM  In your present state of health, do you have any difficulty performing the following activities:  Hearing? 0 0  Vision? 0 0  Difficulty concentrating or making decisions? 0 0  Walking or climbing stairs? 1   Comment bilateral knee pain   Dressing or bathing? 0   Doing errands, shopping? 0   Preparing Food and eating ? N   Using  the Toilet? N   In the past six months, have you accidently leaked urine? N   Do you have problems with loss of bowel control? N   Managing your Medications? N   Managing your Finances? N   Housekeeping or managing your Housekeeping? N     Patient Care Team: Fujiko Picazo, Roxan BROCKS, NP as PCP - General (Family Medicine) Tyree Nanetta SAILOR, RN as Oncology Nurse Navigator Loretha Ash, MD as Consulting Physician (Hematology and Oncology)  Indicate any recent Medical Services you may have received from other than [facility] providers in the past year (date may be approximate).     Assessment:   This is a routine wellness examination for Michelle Campos.  Hearing/Vision screen Vision Screening   Right eye Left eye Both eyes  Without correction 20/30 20/40 20/25   With correction     Hearing Screening - Comments:: No hearing  issues.   Goals Addressed             This Visit's Progress    Exercise 150 min/wk Moderate Activity       Exercise x 5 days per week        Depression Screen    09/06/2024    9:09 AM 04/12/2024    9:34 AM 03/21/2024    2:39 PM 08/30/2023    8:25 AM 03/30/2023    1:51 PM 03/22/2023    8:22 AM 08/24/2022    3:03 PM  PHQ 2/9 Scores  PHQ - 2 Score 0 1 0 0 0 0 0    Fall Risk    09/06/2024    9:08 AM 04/12/2024    9:34 AM 03/21/2024    2:39 PM 08/30/2023    8:24 AM 03/30/2023    1:50 PM  Fall Risk   Falls in the past year? 0 0 0 0 0  Number falls in past yr: 0 0 0 0 0  Injury with Fall? 0 0 0 0 0  Risk for fall due to : No Fall Risks No Fall Risks No Fall Risks No Fall Risks No Fall Risks  Follow up Falls evaluation completed Falls prevention discussed;Falls evaluation completed Falls evaluation completed Falls evaluation completed     MEDICARE RISK AT HOME: Medicare Risk at Home Any stairs in or around the home?: Yes If so, are there any without handrails?: No Home free of loose throw rugs in walkways, pet beds, electrical cords, etc?: Yes Adequate lighting in your home to reduce risk of falls?: Yes Life alert?: No Use of a cane, walker or w/c?: No Grab bars in the bathroom?: Yes Shower chair or bench in shower?: No Elevated toilet seat or a handicapped toilet?: Yes  TIMED UP AND GO:  Was the test performed?  Yes  Length of time to ambulate 10 feet: 5 sec Gait steady and fast without use of assistive device    Cognitive Function:    08/30/2023    8:29 AM  MMSE - Mini Mental State Exam  Orientation to time 4  Orientation to Place 5  Registration 3  Attention/ Calculation 5  Recall 2  Language- name 2 objects 2  Language- repeat 1  Language- follow 3 step command 3  Language- read & follow direction 1  Write a sentence 1  Copy design 1  Total score 28        09/06/2024    9:09 AM 08/24/2022    3:05 PM 06/03/2021    9:30 AM  6CIT Screen  What Year?  0  points 0 points 0 points  What month? 0 points 0 points 0 points  What time? 0 points 0 points 0 points  Count back from 20 0 points 0 points 0 points  Months in reverse 0 points 0 points 0 points  Repeat phrase 0 points 0 points 2 points  Total Score 0 points 0 points 2 points    Immunizations Immunization History  Administered Date(s) Administered   Fluad Quad(high Dose 65+) 08/18/2021, 09/18/2022   INFLUENZA, HIGH DOSE SEASONAL PF 09/06/2024   Influenza, Quadrivalent, Recombinant, Inj, Pf 08/24/2018   Influenza,inj,Quad PF,6+ Mos 07/25/2019, 07/19/2020   Influenza-Unspecified 08/24/2018   Moderna Sars-Covid-2 Vaccination 11/28/2020   PFIZER(Purple Top)SARS-COV-2 Vaccination 01/26/2020, 02/17/2020   PNEUMOCOCCAL CONJUGATE-20 09/04/2021   PPD Test 08/07/2019   Pneumococcal Conjugate-13 01/24/2021   Zoster Recombinant(Shingrix) 09/04/2021, 08/26/2022    TDAP status: Due, Education has been provided regarding the importance of this vaccine. Advised may receive this vaccine at local pharmacy or Health Dept. Aware to provide a copy of the vaccination record if obtained from local pharmacy or Health Dept. Verbalized acceptance and understanding.  Flu Vaccine status: Up to date  Pneumococcal vaccine status: Up to date  Covid-19 vaccine status: Information provided on how to obtain vaccines.   Qualifies for Shingles Vaccine? Yes   Zostavax completed No   Shingrix Completed?: Yes  Screening Tests Health Maintenance  Topic Date Due   COVID-19 Vaccine (4 - 2025-26 season) 11/06/2024 (Originally 07/31/2024)   DTaP/Tdap/Td (1 - Tdap) 11/14/2024 (Originally 01/16/1975)   Mammogram  02/28/2025   Fecal DNA (Cologuard)  04/28/2025   Medicare Annual Wellness (AWV)  09/06/2025   DEXA SCAN  07/10/2026   Pneumococcal Vaccine: 50+ Years  Completed   Influenza Vaccine  Completed   Hepatitis C Screening  Completed   Zoster Vaccines- Shingrix  Completed   Meningococcal B Vaccine  Aged Out     Health Maintenance  There are no preventive care reminders to display for this patient.   Colorectal cancer screening: Type of screening: Cologuard. Completed 04/28/2022. Repeat every 5 years  Mammogram status: Completed 02/29/2024. Repeat every year  Bone Density status: Completed 07/10/2021. Results reflect: Bone density results: NORMAL. Repeat every 5 years.  Lung Cancer Screening: (Low Dose CT Chest recommended if Age 54-80 years, 20 pack-year currently smoking OR have quit w/in 15years.) does not qualify.   Lung Cancer Screening Referral: N/A   Additional Screening:  Hepatitis C Screening: does qualify; Completed yes  Vision Screening: Recommended annual ophthalmology exams for early detection of glaucoma and other disorders of the eye. Is the patient up to date with their annual eye exam?  Yes  Who is the provider or what is the name of the office in which the patient attends annual eye exams? Could not recall name  If pt is not established with a provider, would they like to be referred to a provider to establish care? No .   Dental Screening: Recommended annual dental exams for proper oral hygiene  Diabetic Foot Exam: Diabetic Foot Exam: Completed N/a   Community Resource Referral / Chronic Care Management: CRR required this visit?  No   CCM required this visit?  No     Plan:     I have personally reviewed and noted the following in the patient's chart:   Medical and social history Use of alcohol, tobacco or illicit drugs  Current medications and supplements including opioid prescriptions. Patient is not currently taking opioid prescriptions. Functional ability and  status Nutritional status Physical activity Advanced directives List of other physicians Hospitalizations, surgeries, and ER visits in previous 12 months Vitals Screenings to include cognitive, depression, and falls Referrals and appointments  In addition, I have reviewed and discussed with  patient certain preventive protocols, quality metrics, and best practice recommendations. A written personalized care plan for preventive services as well as general preventive health recommendations were provided to patient.     Roxan JAYSON Plough, NP   09/06/2024   After Visit Summary: (In Person-Printed) AVS printed and given to the patient  Nurse Notes: Advised to get Tdap and COVID-19 vaccine

## 2024-10-17 ENCOUNTER — Other Ambulatory Visit: Payer: Self-pay

## 2024-10-17 DIAGNOSIS — D0511 Intraductal carcinoma in situ of right breast: Secondary | ICD-10-CM

## 2024-10-18 ENCOUNTER — Encounter: Payer: Self-pay | Admitting: Adult Health

## 2024-10-18 ENCOUNTER — Inpatient Hospital Stay: Payer: Medicare (Managed Care) | Admitting: Adult Health

## 2024-10-18 ENCOUNTER — Inpatient Hospital Stay: Payer: Medicare (Managed Care) | Attending: Hematology and Oncology

## 2024-10-18 DIAGNOSIS — D0511 Intraductal carcinoma in situ of right breast: Secondary | ICD-10-CM | POA: Insufficient documentation

## 2024-10-18 DIAGNOSIS — Z79811 Long term (current) use of aromatase inhibitors: Secondary | ICD-10-CM | POA: Diagnosis not present

## 2024-10-18 DIAGNOSIS — Z85850 Personal history of malignant neoplasm of thyroid: Secondary | ICD-10-CM | POA: Diagnosis not present

## 2024-10-18 LAB — CBC WITH DIFFERENTIAL (CANCER CENTER ONLY)
Abs Immature Granulocytes: 0.01 K/uL (ref 0.00–0.07)
Basophils Absolute: 0 K/uL (ref 0.0–0.1)
Basophils Relative: 1 %
Eosinophils Absolute: 0.1 K/uL (ref 0.0–0.5)
Eosinophils Relative: 3 %
HCT: 39.9 % (ref 36.0–46.0)
Hemoglobin: 13.8 g/dL (ref 12.0–15.0)
Immature Granulocytes: 0 %
Lymphocytes Relative: 28 %
Lymphs Abs: 1 K/uL (ref 0.7–4.0)
MCH: 28.1 pg (ref 26.0–34.0)
MCHC: 34.6 g/dL (ref 30.0–36.0)
MCV: 81.3 fL (ref 80.0–100.0)
Monocytes Absolute: 0.4 K/uL (ref 0.1–1.0)
Monocytes Relative: 10 %
Neutro Abs: 2.1 K/uL (ref 1.7–7.7)
Neutrophils Relative %: 58 %
Platelet Count: 235 K/uL (ref 150–400)
RBC: 4.91 MIL/uL (ref 3.87–5.11)
RDW: 14.6 % (ref 11.5–15.5)
WBC Count: 3.6 K/uL — ABNORMAL LOW (ref 4.0–10.5)
nRBC: 0 % (ref 0.0–0.2)

## 2024-10-18 LAB — CMP (CANCER CENTER ONLY)
ALT: 21 U/L (ref 0–44)
AST: 27 U/L (ref 15–41)
Albumin: 4.5 g/dL (ref 3.5–5.0)
Alkaline Phosphatase: 114 U/L (ref 38–126)
Anion gap: 10 (ref 5–15)
BUN: 16 mg/dL (ref 8–23)
CO2: 30 mmol/L (ref 22–32)
Calcium: 10 mg/dL (ref 8.9–10.3)
Chloride: 102 mmol/L (ref 98–111)
Creatinine: 0.81 mg/dL (ref 0.44–1.00)
GFR, Estimated: >60 mL/min (ref >60)
Glucose, Bld: 112 mg/dL — ABNORMAL HIGH (ref 70–99)
Potassium: 3.6 mmol/L (ref 3.5–5.1)
Sodium: 142 mmol/L (ref 135–145)
Total Bilirubin: 0.7 mg/dL (ref 0.0–1.2)
Total Protein: 7.9 g/dL (ref 6.5–8.1)

## 2024-10-18 NOTE — Progress Notes (Signed)
 SURVIVORSHIP VISIT:  BRIEF ONCOLOGIC HISTORY:  Oncology History  Ductal carcinoma in situ (DCIS) of right breast  02/29/2024 Mammogram    Indeterminate mass at the right breast 9 o'clock position may reflect a complicated cyst with fluid debris level versus complex solid and cystic mass. Recommend ultrasound-guided biopsy for definitive characterization. 2. No right axillary lymphadenopathy. 3. Interval resolution of the mass at the left breast 3 o'clock position. No mammographic findings of malignancy the left breast.   03/27/2024 Initial Diagnosis   Ductal carcinoma in situ (DCIS) of right breast   04/04/2024 Pathology Results   BREAST, RIGHT, LUMPECTOMY:  - Low-grade ductal carcinoma in situ, involving an intraductal papilloma  - No evidence of invasive carcinoma  - Resection margins are negative for DCIS   Estrogen Receptor: 99%, positive, strong staining intensity       Progesterone Receptor: 80%, positive, strong staining intensity    05/10/2024 Cancer Staging   Staging form: Breast, AJCC 8th Edition - Clinical: Stage 0 (cTis (DCIS), cN0, cM0, G1, ER+, PR+) - Signed by Loretha Ash, MD on 05/10/2024 Stage prefix: Initial diagnosis Histologic grading system: 3 grade system    Genetic Testing   [facility] CustomNext Panel+RNA was Negative. 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 Report date is 05/30/2024.  The [employer] CustomNext Panel included [facility] CancerNext+RNAinsight Panel+ 3 additional thyroid  cancer genes (DICER1, PRKAR1A, and RET) for a total of 43 genes. CancerNext includes sequencing, rearrangement analysis, and RNA analysis for the following 40 genes: APC, ATM, BAP1, BARD1, BMPR1A, BRCA1, BRCA2, BRIP1, CDH1, CDKN2A, CHEK2, FH, FLCN, MET, MLH1, MSH2, MSH6, MUTYH, NF1, NTHL1, PALB2, PMS2, PTEN, RAD51C, RAD51D, RPS20, SMAD4, STK11, TP53, TSC1, TSC2, and VHL (sequencing and deletion/duplication); AXIN2, HOXB13, MBD4, MSH3, POLD1 and  POLE (sequencing only); EPCAM and GREM1 (deletion/duplication only).    05/22/2024 - 06/21/2024 Radiation Therapy   Plan Name: Breast_R Site: Breast, Right Technique: 3D Mode: Photon Dose Per Fraction: 2.66 Gy Prescribed Dose (Delivered / Prescribed): 42.56 Gy / 42.56 Gy Prescribed Fxs (Delivered / Prescribed): 16 / 16   Plan Name: Breast_R_Bst Site: Breast, Right Technique: 3D Mode: Photon Dose Per Fraction: 2 Gy Prescribed Dose (Delivered / Prescribed): 8 Gy / 8 Gy Prescribed Fxs (Delivered / Prescribed): 4 / 4   06/2024 -  Anti-estrogen oral therapy   Anastrozole      INTERVAL HISTORY:  Discussed the use of AI scribe software for clinical note transcription with the patient, who gave verbal consent to proceed.  History of Present Illness Michelle Campos is a 68 year old female with ductal carcinoma in situ who presents for follow-up on her breast cancer treatment and management.  She was diagnosed with ductal carcinoma in situ, stage zero, estrogen and progesterone positive. Genetic testing was negative. Treatment included lumpectomy, radiation, and anti-estrogen therapy. No lymph nodes were removed. She experiences occasional breast 'twinges' that have decreased in intensity.  She is currently on anastrozole  (Arimidex ) once daily without experiencing side effects such as hot flashes, vaginal dryness, bone loss, joint aches, hair thinning, stroke, cataract, or elevated cholesterol.  She has not completed a bone density test, which is recommended every two years while on anastrozole . She has experienced confusion with scheduling this test due to multiple calls and unclear instructions.    REVIEW OF SYSTEMS:  Review of Systems  Constitutional:  Negative for appetite change, chills, fatigue, fever and unexpected weight change.  HENT:   Negative for hearing loss, lump/mass and trouble swallowing.  Eyes:  Negative for eye problems and icterus.  Respiratory:  Negative for  chest tightness, cough and shortness of breath.   Cardiovascular:  Negative for chest pain, leg swelling and palpitations.  Gastrointestinal:  Negative for abdominal distention, abdominal pain, constipation, diarrhea, nausea and vomiting.  Endocrine: Negative for hot flashes.  Genitourinary:  Negative for difficulty urinating.   Musculoskeletal:  Negative for arthralgias.  Skin:  Negative for itching and rash.  Neurological:  Negative for dizziness, extremity weakness, headaches and numbness.  Hematological:  Negative for adenopathy. Does not bruise/bleed easily.  Psychiatric/Behavioral:  Negative for depression. The patient is not nervous/anxious.   Breast: Denies any new nodularity, masses, tenderness, nipple changes, or nipple discharge.       PAST MEDICAL/SURGICAL HISTORY:  Past Medical History:  Diagnosis Date   Arthritis    right knee (05/27/2015)   Breast cancer (HCC) 2025   Family history of adverse reaction to anesthesia    it's hard to bring my father back; more than once too (05/27/2015)   GERD (gastroesophageal reflux disease)    Hypercalcemia    parathyroid  gland removed   Hypertension    controlled with medication   Obesity (BMI 30-39.9)    Parathyroid  cyst    Superficial fungus infection of skin    on chest   Thyroid  cancer (HCC) 2016   Thyroid  mass    2   Past Surgical History:  Procedure Laterality Date   BREAST BIOPSY Right 03/03/2024   US  RT BREAST BX W LOC DEV 1ST LESION IMG BX SPEC US  GUIDE 03/03/2024 [facility]   BREAST BIOPSY  03/30/2024   MM RT RADIOACTIVE SEED LOC MAMMO GUIDE 03/30/2024 [facility]MO[facility]   BREAST LUMPECTOMY WITH RADIOACTIVE SEED LOCALIZATION Right 04/04/2024   Procedure: RIGHT BREAST LUMPECTOMY WITH RADIOACTIVE SEED LOCALIZATION;  Surgeon: Aron Shoulders, MD;  Location: [facility];  Service: General;  Laterality: Right;  RIGHT BREAST SEED LOCALIZED LUMPECTOMY   CESAREAN SECTION  12/01/1995   IRRIGATION AND  DEBRIDEMENT SEBACEOUS CYST  ~ 2010   on back   PARATHYROIDECTOMY Right 05/27/2015   PARATHYROIDECTOMY Right 05/27/2015   Procedure: RIGHT PARATHYROIDECTOMY WITH FROZEN SECTION;  Surgeon: Ida Loader, MD;  Location: [facility] OR;  Service: ENT;  Laterality: Right;   THYROID  LOBECTOMY Right 05/27/2015   THYROIDECTOMY Right 05/27/2015   Procedure: RIGHT THYROID  LOBECTOMY WITH FROZEN SECTION;  Surgeon: Ida Loader, MD;  Location: [facility] OR;  Service: ENT;  Laterality: Right;   TUBAL LIGATION  12/01/1995     ALLERGIES:  Allergies  Allergen Reactions   Skin Adhesives [Cyanoacrylate] Itching     CURRENT MEDICATIONS:  Outpatient Encounter Medications as of 10/18/2024  Medication Sig   ALPRAZolam  (XANAX ) 0.25 MG tablet Take 1 tablet (0.25 mg total) by mouth 2 (two) times daily as needed for anxiety.   amLODipine  (NORVASC ) 10 MG tablet TAKE 1 TABLET DAILY   anastrozole  (ARIMIDEX ) 1 MG tablet Take 1 tablet (1 mg total) by mouth daily.   cetirizine  (ZYRTEC ) 10 MG tablet Take 1 tablet (10 mg total) by mouth at bedtime. (Patient taking differently: Take 10 mg by mouth as needed.)   metoprolol  tartrate (LOPRESSOR ) 25 MG tablet Take 1 tablet (25 mg total) by mouth 2 (two) times daily.   Multiple Vitamins-Minerals (MULTIVITAMIN ADULTS 50+ PO) Take by mouth daily.   triamterene -hydrochlorothiazide  (MAXZIDE -25) 37.5-25 MG tablet TAKE 1 TABLET DAILY   No facility-administered encounter medications on file as of 10/18/2024.     ONCOLOGIC FAMILY HISTORY:  Family  History  Problem Relation Age of Onset   Congestive Heart Failure Mother    Diabetes Mother 12   Hypertension Mother    Congestive Heart Failure Father    Diabetes Sister 43   Hypertension Sister    Multiple myeloma Brother 25   Hypertension Brother    Hypertension Brother    Hyperlipidemia Brother    Hypertension Brother    Hyperlipidemia Brother    Lung cancer Maternal Uncle        heavy smoker   Ovarian cancer Maternal Grandmother         possibly uterine cancer     SOCIAL HISTORY:  Social History   Socioeconomic History   Marital status: Divorced    Spouse name: Not on file   Number of children: Not on file   Years of education: Not on file   Highest education level: Bachelor's degree (e.g., BA, AB, BS)  Occupational History   Not on file  Tobacco Use   Smoking status: Former    Current packs/day: 0.25    Average packs/day: 0.3 packs/day for 4.0 years (1.0 ttl pk-yrs)    Types: Cigarettes   Smokeless tobacco: Never   Tobacco comments:    quit smoking cigarettes in the 1970's  Vaping Use   Vaping status: Never Used  Substance and Sexual Activity   Alcohol use: Yes    Comment: maybe once or twice a year   Drug use: No   Sexual activity: Yes  Other Topics Concern   Not on file  Social History Narrative   Social History      Diet?       Do you drink/eat things with caffeine? Yes, soft drinks, coffee (occasionally)      Marital status?           divorced                         What year were you married? 1992      Do you live in a house, apartment, assisted living, condo, trailer, etc.? house      Is it one or more stories? 2 stories      How many persons live in your home? 2      Do you have any pets in your home? (please list) no      Highest level of education completed? Some graduate studies      Current or past profession: environmental health practitioner      Do you exercise?        yes                              Type & how often? 3 x week- bike, treadmill      Advanced Directives      Do you have a living will? no      Do you have a DNR form?    no                              If not, do you want to discuss one? no      Do you have signed POA/HPOA for forms? no      Functional Status      Do you have difficulty bathing or dressing yourself? no      Do you have difficulty preparing food or eating? no  Do you have difficulty managing your medications? no      Do you have  difficulty managing your finances?  no      Do you have difficulty affording your medications?   Social Drivers of Corporate Investment Banker Strain: Low Risk  (04/09/2024)   Overall Financial Resource Strain (CARDIA)    Difficulty of Paying Living Expenses: Not very hard  Food Insecurity: No Food Insecurity (09/06/2024)   Hunger Vital Sign    Worried About Running Out of Food in the Last Year: Never true    Ran Out of Food in the Last Year: Never true  Transportation Needs: No Transportation Needs (09/06/2024)   PRAPARE - Administrator, Civil Service (Medical): No    Lack of Transportation (Non-Medical): No  Physical Activity: Sufficiently Active (04/09/2024)   Exercise Vital Sign    Days of Exercise per Week: 4 days    Minutes of Exercise per Session: 60 min  Stress: Stress Concern Present (04/09/2024)   [employer] of Occupational Health - Occupational Stress Questionnaire    Feeling of Stress : Rather much  Social Connections: Moderately Integrated (04/09/2024)   Social Connection and Isolation Panel    Frequency of Communication with Friends and Family: More than three times a week    Frequency of Social Gatherings with Friends and Family: More than three times a week    Attends Religious Services: 1 to 4 times per year    Active Member of [employer] or Organizations: Yes    Attends Engineer, Structural: More than 4 times per year    Marital Status: Divorced  Intimate Partner Violence: Not At Risk (09/06/2024)   Humiliation, Afraid, Rape, and Kick questionnaire    Fear of Current or Ex-Partner: No    Emotionally Abused: No    Physically Abused: No    Sexually Abused: No     OBSERVATIONS/OBJECTIVE:  There were no vitals taken for this visit. GENERAL: Patient is a well appearing female in no acute distress HEENT:  Sclerae anicteric.  Oropharynx clear and moist. No ulcerations or evidence of oropharyngeal candidiasis. Neck is supple.  NODES:  No  cervical, supraclavicular, or axillary lymphadenopathy palpated.  BREAST EXAM:  right breast s/p lumpectomy and radiation, no sign of local recurrence; left breast benign LUNGS:  Clear to auscultation bilaterally.  No wheezes or rhonchi. HEART:  Regular rate and rhythm. No murmur appreciated. ABDOMEN:  Soft, nontender.  Positive, normoactive bowel sounds. No organomegaly palpated. MSK:  No focal spinal tenderness to palpation. Full range of motion bilaterally in the upper extremities. EXTREMITIES:  No peripheral edema.   SKIN:  Clear with no obvious rashes or skin changes. No nail dyscrasia. NEURO:  Nonfocal. Well oriented.  Appropriate affect.  LABORATORY DATA:  None for this visit.  DIAGNOSTIC IMAGING:  None for this visit.   ASSESSMENT AND PLAN:  Michelle Campos is a pleasant 68 y.o. 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  She presents to the Survivorship Clinic for our initial meeting and routine follow-up post-completion of treatment for breast cancer.    1. Stage 0 right breast cancer:  Michelle Campos is continuing to recover from definitive treatment for breast cancer. She will follow-up with her medical oncologist, Dr.  Loretha in 6 months with history and physical exam per surveillance protocol.  She will continue her anti-estrogen therapy  with Anastrozole . Thus far, she is tolerating the Anastrozole  well, with minimal side effects. Her mammogram is due 02/2025; orders placed today.   Today, a comprehensive survivorship care plan and treatment summary was reviewed with the patient today detailing her breast cancer diagnosis, treatment course, potential late/long-term effects of treatment, appropriate follow-up care with recommendations for the future, and patient education resources.  A copy of this summary, along  with a letter will be sent to the patients primary care provider via mail/fax/In Basket message after todays visit.    2. Bone health:  Given Michelle Campos age/history of breast cancer and her current treatment regimen including anti-estrogen therapy with Anastrozole , she is at risk for bone demineralization. She is due for bone density testing and I reached out to the radiology manager at [facility] to get this scheduled. She was given education on specific activities to promote bone health.  3. Cancer screening:  Due to Michelle Campos's history and her age, she should receive screening for skin cancers, colon cancer, and gynecologic cancers.  The information and recommendations are listed on the patient's comprehensive care plan/treatment summary and were reviewed in detail with the patient.    4. Health maintenance and wellness promotion: Michelle Campos was encouraged to consume 5-7 servings of fruits and vegetables per day. We reviewed the Nutrition Rainbow handout.  She was also encouraged to engage in moderate to vigorous exercise for 30 minutes per day most days of the week.  She was instructed to limit her alcohol consumption and continue to abstain from tobacco use.     5. Support services/counseling: It is not uncommon for this period of the patient's cancer care trajectory to be one of many emotions and stressors.   She was given information regarding our available services and encouraged to contact me with any questions or for help enrolling in any of our support group/programs.    Follow up instructions:    -Return to cancer center in 6 months for f/u with Dr. Loretha  -Mammogram due in 02/2025 -She is welcome to return back to the Survivorship Clinic at any time; no additional follow-up needed at this time.  -Consider referral back to survivorship as a long-term survivor for continued surveillance  The patient was provided an opportunity to ask questions and all were  answered. The patient agreed with the plan and demonstrated an understanding of the instructions.   Total encounter time:45 minutes*in face-to-face visit time, chart review, lab review, care coordination, order entry, and documentation of the encounter time.    Morna Kendall, NP 10/18/24 9:01 AM Medical Oncology and Hematology [facility] [address] [place], [place] [address] Tel. [phone]    Fax. [phone]  *Total Encounter Time as defined by the Centers for Medicare and Medicaid Services includes, in addition to the face-to-face time of a patient visit (documented in the note above) non-face-to-face time: obtaining and reviewing outside history, ordering and reviewing medications, tests or procedures, care coordination (communications with other health care professionals or caregivers) and documentation in the medical record.

## 2024-10-30 ENCOUNTER — Other Ambulatory Visit: Payer: Medicare (Managed Care)

## 2024-10-30 DIAGNOSIS — E782 Mixed hyperlipidemia: Secondary | ICD-10-CM

## 2024-10-30 DIAGNOSIS — R7303 Prediabetes: Secondary | ICD-10-CM

## 2024-10-30 DIAGNOSIS — I10 Essential (primary) hypertension: Secondary | ICD-10-CM

## 2024-10-30 LAB — HEMOGLOBIN A1C
Hgb A1c MFr Bld: 5.6 % (ref <5.7)
Mean Plasma Glucose: 114 mg/dL
eAG (mmol/L): 6.3 mmol/L

## 2024-10-30 LAB — CBC WITH DIFFERENTIAL/PLATELET
Absolute Lymphocytes: 894 {cells}/uL (ref 850–3900)
Absolute Monocytes: 267 {cells}/uL (ref 200–950)
Basophils Absolute: 20 {cells}/uL (ref 0–200)
Basophils Relative: 0.6 %
Eosinophils Absolute: 119 {cells}/uL (ref 15–500)
Eosinophils Relative: 3.6 %
HCT: 41.9 % (ref 35.9–46.0)
Hemoglobin: 13.3 g/dL (ref 11.7–15.5)
MCH: 27.8 pg (ref 27.0–33.0)
MCHC: 31.7 g/dL (ref 31.6–35.4)
MCV: 87.7 fL (ref 81.4–101.7)
MPV: 12.5 fL (ref 7.5–12.5)
Monocytes Relative: 8.1 %
Neutro Abs: 2000 {cells}/uL (ref 1500–7800)
Neutrophils Relative %: 60.6 %
Platelets: 214 Thousand/uL (ref 140–400)
RBC: 4.78 Million/uL (ref 3.80–5.10)
RDW: 14.2 % (ref 11.0–15.0)
Total Lymphocyte: 27.1 %
WBC: 3.3 Thousand/uL — ABNORMAL LOW (ref 3.8–10.8)

## 2024-10-30 LAB — COMPLETE METABOLIC PANEL WITHOUT GFR
AG Ratio: 1.6 (calc) (ref 1.0–2.5)
ALT: 16 U/L (ref 6–29)
AST: 17 U/L (ref 10–35)
Albumin: 4.4 g/dL (ref 3.6–5.1)
Alkaline phosphatase (APISO): 94 U/L (ref 37–153)
BUN: 16 mg/dL (ref 7–25)
CO2: 32 mmol/L (ref 20–32)
Calcium: 9.7 mg/dL (ref 8.6–10.4)
Chloride: 103 mmol/L (ref 98–110)
Creat: 0.73 mg/dL (ref 0.50–1.05)
Globulin: 2.8 g/dL (ref 1.9–3.7)
Glucose, Bld: 97 mg/dL (ref 65–99)
Potassium: 3.8 mmol/L (ref 3.5–5.3)
Sodium: 141 mmol/L (ref 135–146)
Total Bilirubin: 0.6 mg/dL (ref 0.2–1.2)
Total Protein: 7.2 g/dL (ref 6.1–8.1)

## 2024-10-30 LAB — LIPID PANEL
Cholesterol: 181 mg/dL (ref <200)
HDL: 45 mg/dL — ABNORMAL LOW (ref >=50)
LDL Cholesterol (Calc): 119 mg/dL — ABNORMAL HIGH
Non-HDL Cholesterol (Calc): 136 mg/dL — ABNORMAL HIGH (ref <130)
Total CHOL/HDL Ratio: 4 (calc) (ref <5.0)
Triglycerides: 76 mg/dL (ref <150)

## 2024-10-30 LAB — TSH: TSH: 1.59 m[IU]/L (ref 0.40–4.50)

## 2024-11-01 ENCOUNTER — Ambulatory Visit: Payer: Medicare (Managed Care) | Admitting: Family

## 2024-11-01 VITALS — BP 138/84 | HR 77 | Temp 97.9°F | Resp 20 | Ht 68.0 in | Wt 241.8 lb

## 2024-11-01 DIAGNOSIS — R7303 Prediabetes: Secondary | ICD-10-CM | POA: Diagnosis not present

## 2024-11-01 DIAGNOSIS — I10 Essential (primary) hypertension: Secondary | ICD-10-CM

## 2024-11-01 DIAGNOSIS — Z9889 Other specified postprocedural states: Secondary | ICD-10-CM

## 2024-11-01 DIAGNOSIS — Z9089 Acquired absence of other organs: Secondary | ICD-10-CM

## 2024-11-01 DIAGNOSIS — E782 Mixed hyperlipidemia: Secondary | ICD-10-CM

## 2024-11-01 DIAGNOSIS — F411 Generalized anxiety disorder: Secondary | ICD-10-CM | POA: Diagnosis not present

## 2024-11-01 DIAGNOSIS — J302 Other seasonal allergic rhinitis: Secondary | ICD-10-CM

## 2024-11-01 NOTE — Progress Notes (Signed)
 Provider: Roxan Plough FNP-C   Brilee Port, Roxan BROCKS, NP  Patient Care Team: Ranvir Renovato, Roxan BROCKS, NP as PCP - General (Family Medicine) Loretha Ash, MD as Consulting Physician (Hematology and Oncology) Dewey Rush, MD as Consulting Physician (Radiation Oncology) Aron Shoulders, MD as Consulting Physician (General Surgery)  Extended Emergency Contact Information Primary Emergency Contact: [facility] Address: [address] [place], [place] [address] [place] Phone: [phone] Relation: Sister Secondary Emergency Contact: Johnson,Florinda Address: [address] [place] [address] [name] Phone: [phone] Relation: Sister  Code Status:  Full Code  Goals of care: Advanced Directive information    09/06/2024    9:04 AM  Advanced Directives  Does Patient Have a Medical Advance Directive? Yes  Type of Estate Agent of [place];Living will  Does patient want to make changes to medical advance directive? No - Patient declined  Copy of Healthcare Power of Attorney in Chart? Yes - validated most recent copy scanned in chart (See row information)     Chief Complaint  Patient presents with   Medical Management of Chronic Issues    6 Month follow up.  .   Discussed the use of AI scribe software for clinical note transcription with the patient, who gave verbal consent to proceed.  History of Present Illness   Michelle Campos is a 68 year old female who presents for a six-month follow-up visit.  She experiences sinus pressure and significant postnasal drip, particularly when lying down at night. She has not been taking any medication for this recently but inquires about the use of Zyrtec , which she has used in the past with good effect. Occasional soreness in the sinus area is noted, but she does not frequently blow her nose.  She is currently on several medications including alprazolam   0.25 mg twice daily, which she has not used since her surgery as she has not felt anxious. She continues to take amlodipine  10 mg daily, metoprolol  25 mg twice daily, and triamterene /hydrochlorothiazide  7.5/25 mg daily for blood pressure management. She also takes calcium  supplements due to the effects of Arimidex , which she takes following breast cancer treatment. She experiences hot flashes, which she attributes to the reduction in estrogen caused by Arimidex .  She has a history of thyroid  cancer treated in 2016, with half of the thyroid  removed. She has not had recent follow-up for this condition but has had multiple biopsies in the past. She expresses uncertainty about the need for ongoing monitoring.  Recent lab work shows a low white blood cell count, which she attributes to her cancer history. Her hemoglobin levels are good, and her A1c has improved from 5.8 to 5.6, indicating better blood sugar control. She is aware of her cholesterol levels, with HDL slightly low and LDL slightly elevated, and she is actively engaging in swimming four days a week to improve her cardiovascular health.  She does not frequently check her blood pressure at home but notes that it tends to stay high. She has had recent blood work done twice, once at the cancer center and once at her regular doctor's office, and is unsure why it was repeated.   Past Medical History:  Diagnosis Date   Arthritis    right knee (05/27/2015)   Breast cancer (HCC) 2025   Family history of adverse reaction to anesthesia    it's hard to bring  my father back; more than once too (05/27/2015)   GERD (gastroesophageal reflux disease)    Hypercalcemia    parathyroid  gland removed   Hypertension    controlled with medication   Obesity (BMI 30-39.9)    Parathyroid  cyst    Superficial fungus infection of skin    on chest   Thyroid  cancer (HCC) 2016   Thyroid  mass    2   Past Surgical History:  Procedure Laterality Date   BREAST  BIOPSY Right 03/03/2024   US  RT BREAST BX W LOC DEV 1ST LESION IMG BX SPEC US  GUIDE 03/03/2024 [facility]   BREAST BIOPSY  03/30/2024   MM RT RADIOACTIVE SEED LOC MAMMO GUIDE 03/30/2024 [facility]   BREAST LUMPECTOMY WITH RADIOACTIVE SEED LOCALIZATION Right 04/04/2024   Procedure: RIGHT BREAST LUMPECTOMY WITH RADIOACTIVE SEED LOCALIZATION;  Surgeon: Aron Shoulders, MD;  Location: [facility];  Service: General;  Laterality: Right;  RIGHT BREAST SEED LOCALIZED LUMPECTOMY   CESAREAN SECTION  12/01/1995   IRRIGATION AND DEBRIDEMENT SEBACEOUS CYST  ~ 2010   on back   PARATHYROIDECTOMY Right 05/27/2015   PARATHYROIDECTOMY Right 05/27/2015   Procedure: RIGHT PARATHYROIDECTOMY WITH FROZEN SECTION;  Surgeon: Ida Loader, MD;  Location: [facility] OR;  Service: ENT;  Laterality: Right;   THYROID  LOBECTOMY Right 05/27/2015   THYROIDECTOMY Right 05/27/2015   Procedure: RIGHT THYROID  LOBECTOMY WITH FROZEN SECTION;  Surgeon: Ida Loader, MD;  Location: [facility] OR;  Service: ENT;  Laterality: Right;   TUBAL LIGATION  12/01/1995    Allergies  Allergen Reactions   Skin Adhesives [Cyanoacrylate] Itching    Allergies as of 11/01/2024       Reactions   Skin Adhesives [cyanoacrylate] Itching        Medication List        Accurate as of November 01, 2024  8:06 PM. If you have any questions, ask your nurse or doctor.          ALPRAZolam  0.25 MG tablet Commonly known as: XANAX  Take 1 tablet (0.25 mg total) by mouth 2 (two) times daily as needed for anxiety.   amLODipine  10 MG tablet Commonly known as: NORVASC  TAKE 1 TABLET DAILY   anastrozole  1 MG tablet Commonly known as: ARIMIDEX  Take 1 tablet (1 mg total) by mouth daily.   CALCIUM  600 + D PO Take 600 mg by mouth 2 (two) times daily.   cetirizine  10 MG tablet Commonly known as: ZYRTEC  Take 1 tablet (10 mg total) by mouth at bedtime. What changed:  when to take this reasons to take this   metoprolol  tartrate 25 MG  tablet Commonly known as: LOPRESSOR  Take 1 tablet (25 mg total) by mouth 2 (two) times daily.   MULTIVITAMIN ADULTS 50+ PO Take by mouth daily.   triamterene -hydrochlorothiazide  37.5-25 MG tablet Commonly known as: MAXZIDE -25 TAKE 1 TABLET DAILY        Review of Systems  Immunization History  Administered Date(s) Administered   Fluad Quad(high Dose 65+) 08/18/2021, 09/18/2022   INFLUENZA, HIGH DOSE SEASONAL PF 09/06/2024   Influenza, Quadrivalent, Recombinant, Inj, Pf 08/24/2018   Influenza,inj,Quad PF,6+ Mos 07/25/2019, 07/19/2020   Influenza-Unspecified 08/24/2018   Moderna Sars-Covid-2 Vaccination 11/28/2020   PFIZER(Purple Top)SARS-COV-2 Vaccination 01/26/2020, 02/17/2020   PNEUMOCOCCAL CONJUGATE-20 09/04/2021   PPD Test 08/07/2019   Pneumococcal Conjugate-13 01/24/2021   Zoster Recombinant(Shingrix) 09/04/2021, 08/26/2022   Pertinent  Health Maintenance Due  Topic Date Due   Bone Density Scan  07/11/2023   Mammogram  02/28/2025  Influenza Vaccine  Completed      03/30/2023    1:50 PM 08/30/2023    8:24 AM 03/21/2024    2:39 PM 04/12/2024    9:34 AM 09/06/2024    9:08 AM  Fall Risk  Falls in the past year? 0 0 0 0 0  Was there an injury with Fall? 0  0  0  0  0   Fall Risk Category Calculator 0 0 0 0 0  Patient at Risk for Falls Due to No Fall Risks No Fall Risks No Fall Risks No Fall Risks No Fall Risks  Fall risk Follow up  Falls evaluation completed Falls evaluation completed Falls prevention discussed;Falls evaluation completed Falls evaluation completed     Data saved with a previous flowsheet row definition   Functional Status Survey:    Vitals:   11/01/24 0913  BP: 138/84  Pulse: 77  Resp: 20  Temp: 97.9 F (36.6 C)  SpO2: 97%  Weight: 241 lb 12.8 oz (109.7 kg)  Height: 5' 8 (1.727 m)   Body mass index is 36.77 kg/m. Physical Exam VITALS: T- 97.9, P- 77, BP- 138/84, SaO2- 97% MEASUREMENTS: Weight- 241.8. GENERAL: Alert, cooperative, well  developed, no acute distress HEENT: Normocephalic, normal oropharynx, moist mucous membranes, ears normal bilaterally, nose normal, no sinus tenderness NECK: Thyroid  normal CHEST: Clear to auscultation bilaterally, no wheezes, rhonchi, or crackles, no CVA tenderness CARDIOVASCULAR: Normal heart rate and rhythm, S1 and S2 normal without murmurs ABDOMEN: Soft, non-tender, non-distended, without organomegaly, normal bowel sounds, liver and spleen normal EXTREMITIES: No cyanosis or edema NEUROLOGICAL: Cranial nerves grossly intact, moves all extremities without gross motor or sensory deficit, sensation intact in hands and feet   Labs reviewed: Recent Labs    03/30/24 0730 10/18/24 0837 10/30/24 0821  NA 142 142 141  K 3.8 3.6 3.8  CL 103 102 103  CO2 28 30 32  GLUCOSE 96 112* 97  BUN 12 16 16   CREATININE 0.77 0.81 0.73  CALCIUM  9.9 10.0 9.7   Recent Labs    10/18/24 0837 10/30/24 0821  AST 27 17  ALT 21 16  ALKPHOS 114  --   BILITOT 0.7 0.6  PROT 7.9 7.2  ALBUMIN 4.5  --    Recent Labs    10/18/24 0837 10/30/24 0821  WBC 3.6* 3.3*  NEUTROABS 2.1 2,000  HGB 13.8 13.3  HCT 39.9 41.9  MCV 81.3 87.7  PLT 235 214   Lab Results  Component Value Date   TSH 1.59 10/30/2024   Lab Results  Component Value Date   HGBA1C 5.6 10/30/2024   Lab Results  Component Value Date   CHOL 181 10/30/2024   HDL 45 (L) 10/30/2024   LDLCALC 119 (H) 10/30/2024   TRIG 76 10/30/2024   CHOLHDL 4.0 10/30/2024    Significant Diagnostic Results in last 30 days:  No results found.  Assessment/Plan  Essential hypertension Blood pressure is well-controlled with current medication regimen. Recent reading was 138/84 mmHg. - Continue amlodipine  10 mg daily - Continue metoprolol  25 mg twice daily - Continue triamterene  and hydrochlorothiazide  7.5/25 mg daily  Mixed hyperlipidemia Total cholesterol is 181 mg/dL, within target range. HDL is 45 mg/dL, slightly low but improved from  previous 43 mg/dL. LDL increased from 104 mg/dL to 880 mg/dL, above target of less than 100 mg/dL. Dietary factors may contribute to LDL increase. - Encouraged dietary modifications to reduce LDL, such as reducing intake of eggs and cheese - Continue regular exercise  to improve HDL levels  Generalized anxiety disorder Alprazolam  0.25 mg twice daily is prescribed but not currently used. No recent anxiety episodes reported. - Continue alprazolam  0.25 mg twice daily as needed  Seasonal allergic rhinitis Experiencing sinus pressure and postnasal drip, particularly at night. Zyrtec  is considered safe with current medications. - Start Zyrtec , one tablet daily - Consider Flonase  nasal spray, 2 sprays in each nostril once daily or 1 spray twice daily if needed  History of thyroid  cancer, status post partial thyroidectomy Thyroid  levels are within normal range. No recent follow-up with endocrinologist. Discussed the importance of lifelong monitoring due to history of thyroid  cancer. 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 No chemotherapy received. Discussed dietary modifications to boost immunity. - Increase intake of vitamin C-rich foods or consider vitamin C supplementation - Increase consumption of fruits and vegetables, particularly those high in vitamin C  General health maintenance Discussed the importance of regular screenings and vaccinations. Bone density and mammogram screenings are scheduled. Discussed the importance of COVID vaccination. - Scheduled bone density test for January 17, 2025 - Scheduled mammogram for March 07, 2025 - Scheduled annual Medicare visit for September 12, 2025 - Obtain COVID and detailed vaccine at the pharmacy   Family/ staff Communication: Reviewed plan of care with patient verbalized understanding   Labs/tests ordered:US   Thyroid    Next Appointment : Return in about 6 months (around 05/02/2025) for medical mangement of chronic issues., Fasting labs in 6 months prior to visit.   Spent 30 minutes of Face to face and non-face to face with patient  >50% time spent counseling; reviewing medical record; tests; labs; documentation and developing future plan of care.   Roxan JAYSON Plough, NP

## 2024-11-03 ENCOUNTER — Ambulatory Visit: Payer: Self-pay | Admitting: Family

## 2024-11-15 ENCOUNTER — Ambulatory Visit: Payer: Self-pay | Admitting: Family

## 2024-11-15 ENCOUNTER — Inpatient Hospital Stay
Admission: RE | Admit: 2024-11-15 | Discharge: 2024-11-15 | Payer: Medicare (Managed Care) | Attending: Family | Admitting: Family

## 2024-11-15 DIAGNOSIS — Z9889 Other specified postprocedural states: Secondary | ICD-10-CM

## 2024-11-15 DIAGNOSIS — E041 Nontoxic single thyroid nodule: Secondary | ICD-10-CM | POA: Diagnosis not present

## 2024-11-23 ENCOUNTER — Other Ambulatory Visit: Payer: Self-pay | Admitting: Family

## 2025-01-17 ENCOUNTER — Other Ambulatory Visit (HOSPITAL_BASED_OUTPATIENT_CLINIC_OR_DEPARTMENT_OTHER): Payer: Medicare (Managed Care)

## 2025-04-11 ENCOUNTER — Inpatient Hospital Stay: Payer: Medicare (Managed Care) | Admitting: Hematology and Oncology

## 2025-04-30 ENCOUNTER — Other Ambulatory Visit: Payer: Medicare (Managed Care)

## 2025-05-02 ENCOUNTER — Ambulatory Visit: Payer: Medicare (Managed Care) | Admitting: Family

## 2025-09-12 ENCOUNTER — Ambulatory Visit: Payer: Medicare (Managed Care) | Admitting: Family
